# Patient Record
Sex: Female | Born: 1990
Health system: Southern US, Community
[De-identification: ages and names within clinical notes are randomized; demographics above are authoritative.]

## PROBLEM LIST (undated history)

## (undated) ENCOUNTER — Emergency Department (HOSPITAL_COMMUNITY): Payer: Medicaid Other

## (undated) ENCOUNTER — Inpatient Hospital Stay (HOSPITAL_COMMUNITY): Payer: Self-pay

## (undated) DIAGNOSIS — O98811 Other maternal infectious and parasitic diseases complicating pregnancy, first trimester: Secondary | ICD-10-CM

## (undated) DIAGNOSIS — O24419 Gestational diabetes mellitus in pregnancy, unspecified control: Secondary | ICD-10-CM

## (undated) DIAGNOSIS — A549 Gonococcal infection, unspecified: Secondary | ICD-10-CM

## (undated) DIAGNOSIS — I10 Essential (primary) hypertension: Secondary | ICD-10-CM

## (undated) DIAGNOSIS — A599 Trichomoniasis, unspecified: Secondary | ICD-10-CM

## (undated) DIAGNOSIS — O139 Gestational [pregnancy-induced] hypertension without significant proteinuria, unspecified trimester: Secondary | ICD-10-CM

## (undated) DIAGNOSIS — D649 Anemia, unspecified: Secondary | ICD-10-CM

## (undated) DIAGNOSIS — A6000 Herpesviral infection of urogenital system, unspecified: Secondary | ICD-10-CM

## (undated) DIAGNOSIS — A749 Chlamydial infection, unspecified: Secondary | ICD-10-CM

## (undated) HISTORY — DX: Gestational diabetes mellitus in pregnancy, unspecified control: O24.419

## (undated) HISTORY — PX: NO PAST SURGERIES: SHX2092

## (undated) HISTORY — DX: Herpesviral infection of urogenital system, unspecified: A60.00

## (undated) HISTORY — DX: Gestational (pregnancy-induced) hypertension without significant proteinuria, unspecified trimester: O13.9

## (undated) HISTORY — DX: Anemia, unspecified: D64.9

## (undated) HISTORY — DX: Chlamydial infection, unspecified: A74.9

## (undated) HISTORY — DX: Other maternal infectious and parasitic diseases complicating pregnancy, first trimester: O98.811

---
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
## 2010-12-07 NOTE — L&D Delivery Note (Signed)
Delivery Note At  a viable unspecified sex was delivered via  (Presentation:LOA ;  ).  APGAR: , ; weight .   Placenta status:spont vis shultz, foul odor noted with delivery , .  Cord:3 VC  with the following complications:Current GC and Chla infection .    Anesthesia:  none Episiotomy: none Lacerations: none Suture Repair: none Est. Blood Loss350 (mL):   Mom to postpartum.  Baby to nursery-stable.  [name] 11/01/2011, 4:43 PM

## 2011-05-26 ENCOUNTER — Emergency Department (HOSPITAL_COMMUNITY)
Admission: EM | Admit: 2011-05-26 | Discharge: 2011-05-26 | Disposition: A | Discharge status: Home / Self Care | Payer: Self-pay | Attending: Emergency Medicine | Admitting: Emergency Medicine

## 2011-05-26 ENCOUNTER — Emergency Department (HOSPITAL_COMMUNITY): Payer: Self-pay

## 2011-05-26 DIAGNOSIS — B3731 Acute candidiasis of vulva and vagina: Secondary | ICD-10-CM | POA: Insufficient documentation

## 2011-05-26 DIAGNOSIS — O98819 Other maternal infectious and parasitic diseases complicating pregnancy, unspecified trimester: Secondary | ICD-10-CM | POA: Insufficient documentation

## 2011-05-26 DIAGNOSIS — A6000 Herpesviral infection of urogenital system, unspecified: Secondary | ICD-10-CM | POA: Insufficient documentation

## 2011-05-26 DIAGNOSIS — A599 Trichomoniasis, unspecified: Secondary | ICD-10-CM | POA: Insufficient documentation

## 2011-05-26 DIAGNOSIS — O2390 Unspecified genitourinary tract infection in pregnancy, unspecified trimester: Secondary | ICD-10-CM | POA: Insufficient documentation

## 2011-05-26 DIAGNOSIS — O98519 Other viral diseases complicating pregnancy, unspecified trimester: Secondary | ICD-10-CM | POA: Insufficient documentation

## 2011-05-26 DIAGNOSIS — B373 Candidiasis of vulva and vagina: Secondary | ICD-10-CM | POA: Insufficient documentation

## 2011-05-26 DIAGNOSIS — A499 Bacterial infection, unspecified: Secondary | ICD-10-CM | POA: Insufficient documentation

## 2011-05-26 DIAGNOSIS — N390 Urinary tract infection, site not specified: Secondary | ICD-10-CM | POA: Insufficient documentation

## 2011-05-26 DIAGNOSIS — B9689 Other specified bacterial agents as the cause of diseases classified elsewhere: Secondary | ICD-10-CM | POA: Insufficient documentation

## 2011-05-26 DIAGNOSIS — N760 Acute vaginitis: Secondary | ICD-10-CM | POA: Insufficient documentation

## 2011-05-26 LAB — URINALYSIS, ROUTINE W REFLEX MICROSCOPIC
Glucose, UA: NEGATIVE mg/dL
Glucose, UA: NEGATIVE mg/dL
Nitrite: NEGATIVE
Protein, ur: 30 mg/dL — AB
Specific Gravity, Urine: 1.03 (ref 1.005–1.030)
Specific Gravity, Urine: 1.029 (ref 1.005–1.030)
Urobilinogen, UA: 1 mg/dL (ref 0.0–1.0)
pH: 6 (ref 5.0–8.0)

## 2011-05-26 LAB — POCT I-STAT, CHEM 8
Chloride: 103 mEq/L (ref 96–112)
Creatinine, Ser: 0.5 mg/dL (ref 0.50–1.10)
Glucose, Bld: 80 mg/dL (ref 70–99)
Hemoglobin: 11.9 g/dL — ABNORMAL LOW (ref 12.0–15.0)
Potassium: 3.2 mEq/L — ABNORMAL LOW (ref 3.5–5.1)
Sodium: 136 mEq/L (ref 135–145)
TCO2: 22 mmol/L (ref 0–100)

## 2011-05-26 LAB — URINE MICROSCOPIC-ADD ON

## 2011-05-26 LAB — POCT PREGNANCY, URINE: Preg Test, Ur: POSITIVE

## 2011-05-26 LAB — WET PREP, GENITAL

## 2011-05-27 LAB — URINE CULTURE
Colony Count: NO GROWTH
Culture  Setup Time: 201206191823

## 2011-05-27 LAB — GC/CHLAMYDIA PROBE AMP, GENITAL: GC Probe Amp, Genital: POSITIVE — AB

## 2011-05-28 LAB — HERPES SIMPLEX VIRUS CULTURE: Culture: DETECTED

## 2011-07-02 ENCOUNTER — Inpatient Hospital Stay (HOSPITAL_COMMUNITY)
Admission: AD | Admit: 2011-07-02 | Discharge: 2011-07-03 | Disposition: A | Discharge status: Home / Self Care | Payer: Self-pay | Source: Ambulatory Visit | Attending: Obstetrics & Gynecology | Admitting: Obstetrics & Gynecology

## 2011-07-02 ENCOUNTER — Inpatient Hospital Stay (HOSPITAL_COMMUNITY): Payer: Self-pay

## 2011-07-02 ENCOUNTER — Encounter (HOSPITAL_COMMUNITY): Payer: Self-pay | Admitting: *Deleted

## 2011-07-02 DIAGNOSIS — O9989 Other specified diseases and conditions complicating pregnancy, childbirth and the puerperium: Secondary | ICD-10-CM | POA: Insufficient documentation

## 2011-07-02 DIAGNOSIS — R109 Unspecified abdominal pain: Secondary | ICD-10-CM | POA: Insufficient documentation

## 2011-07-02 DIAGNOSIS — A599 Trichomoniasis, unspecified: Secondary | ICD-10-CM | POA: Insufficient documentation

## 2011-07-02 MED ORDER — METRONIDAZOLE 500 MG PO TABS
2000.0000 mg | ORAL_TABLET | Freq: Once | ORAL | Status: DC
  Filled 2011-07-02: qty 4

## 2011-07-02 MED ORDER — METRONIDAZOLE 50 MG/ML ORAL SUSPENSION: 2000.0000 mg | Freq: Once | ORAL | Status: DC

## 2011-07-02 NOTE — Progress Notes (Signed)
"  I have been having bad pains at the bottom of my stomach for 2 days.  No N/V/D."

## 2011-07-02 NOTE — ED Provider Notes (Signed)
Chief Complaint:  Abdominal Pain   Tanya Moreno is  20 y.o. G1P0.  No LMP recorded. Patient is pregnant..  Her pregnancy status is positive.  She presents complaining of Abdominal Pain . Onset is described as gradual and has been present for  2 hours.   Obstetrical/Gynecological History: Pertinent Gynecological History: Menses:  Bleeding:  Contraception:  DES exposure: denies Blood transfusions: +GC/CHL and HSV2 culture 6/19.  Pt and partner were treated Sexually transmitted diseases:  Previous GYN Procedures:   Last mammogram:  Date:     Past Medical History: No past medical history on file.  Past Surgical History: No past surgical history on file.  Family History: No family history on file.  Social History: History  Substance Use Topics  . Smoking status: Not on file  . Smokeless tobacco: Not on file  . Alcohol Use: Not on file    Allergies:  Allergies  Allergen Reactions  . Latex Swelling    No prescriptions prior to admission    Review of Systems - General ROS: positive for  - lower abdominal cramping  Physical Exam   Blood pressure 108/61, pulse 70, temperature 98.7 F (37.1 C), temperature source Oral, resp. rate 18, height 5\' 4"  (1.626 m), weight 76.204 kg (168 lb).  General: General appearance - alert, well appearing, and in no distress Heart - normal rate, regular rhythm, normal S1, S2, no murmurs, rubs, clicks or gallops, normal rate and regular rhythm Abdomen - soft, nontender, nondistended, no masses or organomegaly no rebound tenderness noted bowel sounds normal Focused Gynecological Exam: normal external genitalia, vulva, vagina; CX closed, feels short, soft  Labs: No results found for this or any previous visit (from the past 24 hour(s)). Imaging Studies:  No results found.   Assessment: There is no problem list on file for this patient.   Plan: Transvaginal u/s to assess cx length  CRESENZO-DISHMAN,[name] 07/02/2011,10:21  PM

## 2011-07-02 NOTE — Progress Notes (Signed)
Pt reports "I have been having real bad pain for 2 days" , has not started prenatal care yet , just found out she was pregnant on 06/19. Denies dysuria, nausea, vomiting, diarrhea, fever. Denies bleeding

## 2011-08-12 ENCOUNTER — Inpatient Hospital Stay (HOSPITAL_COMMUNITY)
Admission: AD | Admit: 2011-08-12 | Discharge: 2011-08-12 | Disposition: A | Discharge status: Home / Self Care | Payer: Self-pay | Source: Ambulatory Visit | Attending: Obstetrics & Gynecology | Admitting: Obstetrics & Gynecology

## 2011-08-12 ENCOUNTER — Encounter (HOSPITAL_COMMUNITY): Payer: Self-pay | Admitting: *Deleted

## 2011-08-12 DIAGNOSIS — R109 Unspecified abdominal pain: Secondary | ICD-10-CM | POA: Insufficient documentation

## 2011-08-12 DIAGNOSIS — D649 Anemia, unspecified: Secondary | ICD-10-CM

## 2011-08-12 DIAGNOSIS — R42 Dizziness and giddiness: Secondary | ICD-10-CM | POA: Insufficient documentation

## 2011-08-12 DIAGNOSIS — O9989 Other specified diseases and conditions complicating pregnancy, childbirth and the puerperium: Secondary | ICD-10-CM | POA: Insufficient documentation

## 2011-08-12 LAB — CBC
HCT: 29.4 % — ABNORMAL LOW (ref 36.0–46.0)
Hemoglobin: 9.7 g/dL — ABNORMAL LOW (ref 12.0–15.0)
MCH: 27 pg (ref 26.0–34.0)
MCHC: 33 g/dL (ref 30.0–36.0)
MCV: 81.9 fL (ref 78.0–100.0)
RBC: 3.59 MIL/uL — ABNORMAL LOW (ref 3.87–5.11)

## 2011-08-12 MED ORDER — FERROUS SULFATE 325 (65 FE) MG PO TABS: 325.0000 mg | ORAL_TABLET | Freq: Three times a day (TID) | ORAL | Status: DC

## 2011-08-12 NOTE — Progress Notes (Signed)
Pt reports being at home, standing up from toilet and feeling dizzy.  Called ems.

## 2011-08-12 NOTE — ED Provider Notes (Signed)
Chief Complaint:  Abdominal pain and dizziness   Tanya Moreno is  20 y.o. G1P0.  No LMP recorded. Patient is pregnant..  Her pregnancy status is positive.  She presents complaining of Abdominal pain and dizziness . Onset is described as sudden around 2 PM. While sitting on toilet. Lower abdominal non-radiating. Last 10 minutes. Dizziness last a few seconds. She denies vaginal bleeding, LOF, vaginal discharge. She also denies CP, SOB.   Obstetrical/Gynecological History: Pertinent Gynecological History: Menses:  Bleeding:  Contraception:  DES exposure: denies Blood transfusions: +GC/CHL and HSV2 culture 6/19.  Pt and partner were treated Sexually transmitted diseases:  Previous GYN Procedures:   Last mammogram:  Date:     Past Medical History: Past Medical History  Diagnosis Date  . No pertinent past medical history     Past Surgical History: Past Surgical History  Procedure Date  . No past surgeries     Family History: History reviewed. No pertinent family history.  Social History: History  Substance Use Topics  . Smoking status: Not on file  . Smokeless tobacco: Not on file  . Alcohol Use: No    Allergies:  Allergies  Allergen Reactions  . Latex Swelling    No prescriptions prior to admission    Review of Systems - General ROS: positive for  - lower abdominal cramping  Physical Exam   Blood pressure 95/54, pulse 86, temperature 98.2 F (36.8 C), temperature source Oral, resp. rate 18.  General: General appearance - alert, well appearing, and in no distress Heart - normal rate, regular rhythm, normal S1, S2, no murmurs, rubs, clicks or gallops, normal rate and regular rhythm Abdomen - soft, nontender, nondistended, no masses or organomegaly no rebound tenderness noted bowel sounds normal  FHT: baseline 150, moderate variability, no accels, no decels. Toco: no contractions.   Labs: No results found for this or any previous visit (from the past 24  hour(s)). Imaging Studies:  No results found.   Assessment: There is no problem list on file for this patient.   Plan: CBC to assess for possible anemia.  If normal patient stable for d/c to home. She has just received her medicaid and will have prenatal care at [facility] starting on 08/30/11 [facility] 08/12/2011,5:57 PM

## 2011-08-26 ENCOUNTER — Inpatient Hospital Stay (HOSPITAL_COMMUNITY)
Admission: AD | Admit: 2011-08-26 | Discharge: 2011-08-26 | Disposition: A | Discharge status: Home / Self Care | Payer: Self-pay | Source: Ambulatory Visit | Attending: Obstetrics & Gynecology | Admitting: Obstetrics & Gynecology

## 2011-08-26 ENCOUNTER — Encounter (HOSPITAL_COMMUNITY): Payer: Self-pay | Admitting: *Deleted

## 2011-08-26 DIAGNOSIS — O0930 Supervision of pregnancy with insufficient antenatal care, unspecified trimester: Secondary | ICD-10-CM

## 2011-08-26 DIAGNOSIS — Z91040 Latex allergy status: Secondary | ICD-10-CM

## 2011-08-26 DIAGNOSIS — O368130 Decreased fetal movements, third trimester, not applicable or unspecified: Secondary | ICD-10-CM

## 2011-08-26 DIAGNOSIS — O98819 Other maternal infectious and parasitic diseases complicating pregnancy, unspecified trimester: Secondary | ICD-10-CM | POA: Insufficient documentation

## 2011-08-26 DIAGNOSIS — J069 Acute upper respiratory infection, unspecified: Secondary | ICD-10-CM | POA: Insufficient documentation

## 2011-08-26 DIAGNOSIS — A599 Trichomoniasis, unspecified: Secondary | ICD-10-CM

## 2011-08-26 DIAGNOSIS — Z3400 Encounter for supervision of normal first pregnancy, unspecified trimester: Secondary | ICD-10-CM

## 2011-08-26 DIAGNOSIS — D649 Anemia, unspecified: Secondary | ICD-10-CM

## 2011-08-26 DIAGNOSIS — O98519 Other viral diseases complicating pregnancy, unspecified trimester: Secondary | ICD-10-CM

## 2011-08-26 DIAGNOSIS — O368190 Decreased fetal movements, unspecified trimester, not applicable or unspecified: Secondary | ICD-10-CM | POA: Insufficient documentation

## 2011-08-26 DIAGNOSIS — B009 Herpesviral infection, unspecified: Secondary | ICD-10-CM

## 2011-08-26 DIAGNOSIS — Z202 Contact with and (suspected) exposure to infections with a predominantly sexual mode of transmission: Secondary | ICD-10-CM

## 2011-08-26 DIAGNOSIS — A5901 Trichomonal vulvovaginitis: Secondary | ICD-10-CM | POA: Insufficient documentation

## 2011-08-26 DIAGNOSIS — O99891 Other specified diseases and conditions complicating pregnancy: Secondary | ICD-10-CM | POA: Insufficient documentation

## 2011-08-26 LAB — URINALYSIS, ROUTINE W REFLEX MICROSCOPIC
Bilirubin Urine: NEGATIVE
Ketones, ur: NEGATIVE mg/dL
Nitrite: NEGATIVE
Urobilinogen, UA: 0.2 mg/dL (ref 0.0–1.0)

## 2011-08-26 LAB — CBC
MCH: 26.1 pg (ref 26.0–34.0)
MCV: 81.1 fL (ref 78.0–100.0)
Platelets: 290 10*3/uL (ref 150–400)
RDW: 14.2 % (ref 11.5–15.5)
WBC: 8.9 10*3/uL (ref 4.0–10.5)

## 2011-08-26 LAB — URINE MICROSCOPIC-ADD ON

## 2011-08-26 MED ORDER — METRONIDAZOLE 500 MG PO TABS
2000.0000 mg | ORAL_TABLET | Freq: Once | ORAL | Status: AC
  Administered 2011-08-26: 2000 mg via ORAL
  Filled 2011-08-26: qty 4

## 2011-08-26 MED ORDER — ONDANSETRON 8 MG PO TBDP
8.0000 mg | ORAL_TABLET | Freq: Once | ORAL | Status: AC
  Administered 2011-08-26: 8 mg via ORAL
  Filled 2011-08-26: qty 1

## 2011-08-26 NOTE — ED Provider Notes (Signed)
ENYAH MOMAN is a 20 y.o. female G1P0 female at [redacted]w[redacted]d weeks gestation who presents to MAU reporting decreased FM since this morning and cough, congestion, body aches x 2-3 days. She denies dysuria, UC's, VB, vaginal discharge, flank pain or chills. She reports fever of 101 2-3 days ago that resolved spontaneously. She has not taken any medication during this illness. She has not started [facility], but states she is scheduled for a NOB at [facility] on 08/31/11. She has been seen in the ED and MAU twice this pregnancy and was Dx w/ GC, CT and Trich adn Tx. She states her partner was Tx too.  Maternal Medical History:  Reason for admission: Reason for Admission:   nauseaFetal activity: Perceived fetal activity is decreased.   Last perceived fetal movement was within the past 12 hours.      OB History    Grav Para Term Preterm Abortions TAB SAB Ect Mult Living   1              Patient Active Problem List  Diagnoses  . Prenatal care insufficient  . Chlamydia contact, treated  . Gonorrhea contact, treated  . Trichomonas contact, treated  . First normal pregnancy supervision  . Latex allergy  . Herpes simplex type 2 infection complicating pregnancy    Past Medical History  Diagnosis Date      Anemia  Past Surgical History  Procedure Date  . No past surgeries    Family History: family history is not on file. Social History:  reports that she has never smoked. She does not have any smokeless tobacco history on file. She reports that she does not drink alcohol or use illicit drugs.  Review of Systems  Constitutional: Positive for fever (resolved).  HENT: Positive for congestion. Negative for ear pain and sore throat.   Respiratory: Positive for cough and sputum production. Negative for shortness of breath and wheezing.   Gastrointestinal: Negative.  Negative for nausea, vomiting and diarrhea.  Genitourinary: Negative for dysuria, urgency, frequency, hematuria and flank pain.    Musculoskeletal: Positive for myalgias.   Otherwise neg  Dilation: Closed Effacement (%): Thick Station: -2 Exam by:: Ivonne Andrew CNM Blood pressure 108/62, pulse 94, temperature 98.2 F (36.8 C), temperature source Oral, resp. rate 16, height 5\' 3"  (1.6 m), weight 79.55 kg (175 lb 6 oz). Maternal Exam:  Uterine Assessment: None  Abdomen: Fundal height is S=D.    Introitus: Normal vulva. Vagina is positive for vaginal discharge (moderate amount of yellow, frothy, malodorous discharge.).  Pelvis: adequate for delivery.   Cervix: Cervix evaluated by sterile speculum exam and digital exam.     Fetal Exam Fetal Monitor Review: Mode: ultrasound.   Baseline rate: 130's.  Variability: moderate (6-25 bpm).   Pattern: accelerations present and no decelerations.    Fetal State Assessment: Category I - tracings are normal.     Physical Exam  Constitutional: She is oriented to person, place, and time. She appears well-developed and well-nourished. No distress.  Cardiovascular: Normal rate and regular rhythm.   Murmur (I/VI systolic) heard. Respiratory: Effort normal and breath sounds normal. No respiratory distress. She has no wheezes. She has no rales. She exhibits no tenderness.  GI: Soft. There is no tenderness.  Genitourinary: Uterus is not tender. Cervix exhibits friability. Cervix exhibits no motion tenderness and no discharge. No erythema, tenderness or bleeding around the vagina. Vaginal discharge (moderate amount of yellow, frothy, malodorous discharge.) found.  Lymphadenopathy:    She has no  cervical adenopathy.  Neurological: She is alert and oriented to person, place, and time.  Skin: Skin is warm and dry. She is not diaphoretic.  Psychiatric: She has a normal mood and affect.    Prenatal labs: ABO, Rh:   Antibody:   Rubella:   RPR:    HBsAg:    HIV:    GBS:     Assessment/Plan: Assessment: 1. Viral URI 2. Trichomonas 3. 30.5 week IUP 4. No PNC  Plan: 1.  Flagyl 2 gm PO x 1 now. Needs to have partner Tx. 2. GC/CT 3. Increase rest. Fluids. May take Mucinex. 4. F/U AS at [facility] 08/31/11 or PRN for worsening of Sx or fever. 5. Reviewed PTL precautions adn safe sex practices. Discussed effects of STDs in pregnancy. 6. Urine Culture    [place], Koleen Nimrod 08/26/2011, 12:17 PM

## 2011-08-26 NOTE — Progress Notes (Signed)
States has been around multiple people who have been sick with runny nose, sore throat, fever. Pt has cough/congestion, afebrile in triage, body aches, denies sore throat. Last good fm felt last pm.

## 2011-08-27 LAB — URINE CULTURE
Culture  Setup Time: 201209200012
Special Requests: NORMAL

## 2011-11-01 ENCOUNTER — Encounter (HOSPITAL_COMMUNITY): Payer: Self-pay | Admitting: *Deleted

## 2011-11-01 ENCOUNTER — Inpatient Hospital Stay (HOSPITAL_COMMUNITY)
Admission: AD | Admit: 2011-11-01 | Discharge: 2011-11-03 | DRG: 775 | Disposition: A | Discharge status: Home / Self Care | Payer: Medicaid Other | Source: Ambulatory Visit | Attending: Obstetrics & Gynecology | Admitting: Obstetrics & Gynecology

## 2011-11-01 ENCOUNTER — Inpatient Hospital Stay (HOSPITAL_COMMUNITY): Payer: Medicaid Other

## 2011-11-01 ENCOUNTER — Other Ambulatory Visit (HOSPITAL_COMMUNITY): Payer: Self-pay | Admitting: Obstetrics and Gynecology

## 2011-11-01 DIAGNOSIS — O0930 Supervision of pregnancy with insufficient antenatal care, unspecified trimester: Secondary | ICD-10-CM

## 2011-11-01 DIAGNOSIS — IMO0001 Reserved for inherently not codable concepts without codable children: Secondary | ICD-10-CM

## 2011-11-01 HISTORY — DX: Chlamydial infection, unspecified: A74.9

## 2011-11-01 HISTORY — DX: Gonococcal infection, unspecified: A54.9

## 2011-11-01 LAB — CBC
HCT: 32.7 % — ABNORMAL LOW (ref 36.0–46.0)
MCV: 77.7 fL — ABNORMAL LOW (ref 78.0–100.0)
RDW: 15.5 % (ref 11.5–15.5)
WBC: 9.8 10*3/uL (ref 4.0–10.5)

## 2011-11-01 LAB — ABO/RH

## 2011-11-01 MED ORDER — ONDANSETRON HCL 4 MG PO TABS: 4.0000 mg | ORAL_TABLET | ORAL | Status: DC | PRN

## 2011-11-01 MED ORDER — IBUPROFEN 600 MG PO TABS
600.0000 mg | ORAL_TABLET | Freq: Four times a day (QID) | ORAL | Status: DC
  Filled 2011-11-01: qty 1

## 2011-11-01 MED ORDER — OXYCODONE-ACETAMINOPHEN 5-325 MG PO TABS: 2.0000 | ORAL_TABLET | ORAL | Status: DC | PRN

## 2011-11-01 MED ORDER — IBUPROFEN 600 MG PO TABS: 600.0000 mg | ORAL_TABLET | Freq: Four times a day (QID) | ORAL | Status: DC | PRN

## 2011-11-01 MED ORDER — WITCH HAZEL-GLYCERIN EX PADS: 1.0000 "application " | MEDICATED_PAD | CUTANEOUS | Status: DC | PRN

## 2011-11-01 MED ORDER — NALBUPHINE SYRINGE 5 MG/0.5 ML: 10.0000 mg | INJECTION | INTRAMUSCULAR | Status: DC | PRN

## 2011-11-01 MED ORDER — OXYTOCIN BOLUS FROM INFUSION
500.0000 mL | Freq: Once | INTRAVENOUS | Status: DC
  Filled 2011-11-01: qty 500
  Filled 2011-11-01: qty 1000

## 2011-11-01 MED ORDER — CITRIC ACID-SODIUM CITRATE 334-500 MG/5ML PO SOLN: 30.0000 mL | ORAL | Status: DC | PRN

## 2011-11-01 MED ORDER — ONDANSETRON HCL 4 MG/2ML IJ SOLN: 4.0000 mg | INTRAMUSCULAR | Status: DC | PRN

## 2011-11-01 MED ORDER — AZITHROMYCIN 1 G PO PACK
1.0000 g | PACK | Freq: Once | ORAL | Status: AC
  Administered 2011-11-01: 1 g via ORAL
  Filled 2011-11-01: qty 1

## 2011-11-01 MED ORDER — ZOLPIDEM TARTRATE 5 MG PO TABS: 5.0000 mg | ORAL_TABLET | Freq: Every evening | ORAL | Status: DC | PRN

## 2011-11-01 MED ORDER — BENZOCAINE-MENTHOL 20-0.5 % EX AERO: 1.0000 "application " | INHALATION_SPRAY | CUTANEOUS | Status: DC | PRN

## 2011-11-01 MED ORDER — LACTATED RINGERS IV SOLN: 500.0000 mL | INTRAVENOUS | Status: DC | PRN

## 2011-11-01 MED ORDER — CEFTRIAXONE SODIUM 250 MG IJ SOLR
250.0000 mg | Freq: Once | INTRAMUSCULAR | Status: AC
  Administered 2011-11-01: 250 mg via INTRAMUSCULAR
  Filled 2011-11-01: qty 250

## 2011-11-01 MED ORDER — OXYTOCIN 20 UNITS IN LACTATED RINGERS INFUSION - SIMPLE: 125.0000 mL/h | Freq: Once | INTRAVENOUS | Status: DC

## 2011-11-01 MED ORDER — PRENATAL PLUS 27-1 MG PO TABS
1.0000 | ORAL_TABLET | Freq: Every day | ORAL | Status: DC
  Filled 2011-11-01: qty 1

## 2011-11-01 MED ORDER — IBUPROFEN 100 MG/5ML PO SUSP
600.0000 mg | Freq: Four times a day (QID) | ORAL | Status: DC
  Filled 2011-11-01 (×7): qty 30

## 2011-11-01 MED ORDER — LACTATED RINGERS IV SOLN
INTRAVENOUS | Status: DC
  Administered 2011-11-01: 13:00:00 via INTRAVENOUS

## 2011-11-01 MED ORDER — LIDOCAINE HCL (PF) 1 % IJ SOLN
30.0000 mL | INTRAMUSCULAR | Status: DC | PRN
  Filled 2011-11-01: qty 30

## 2011-11-01 MED ORDER — DIBUCAINE 1 % RE OINT: 1.0000 "application " | TOPICAL_OINTMENT | RECTAL | Status: DC | PRN

## 2011-11-01 MED ORDER — ONDANSETRON HCL 4 MG/2ML IJ SOLN: 4.0000 mg | Freq: Four times a day (QID) | INTRAMUSCULAR | Status: DC | PRN

## 2011-11-01 MED ORDER — FLEET ENEMA 7-19 GM/118ML RE ENEM: 1.0000 | ENEMA | RECTAL | Status: DC | PRN

## 2011-11-01 MED ORDER — SENNOSIDES-DOCUSATE SODIUM 8.6-50 MG PO TABS: 2.0000 | ORAL_TABLET | Freq: Every day | ORAL | Status: DC

## 2011-11-01 MED ORDER — ACETAMINOPHEN 325 MG PO TABS: 650.0000 mg | ORAL_TABLET | ORAL | Status: DC | PRN

## 2011-11-01 MED ORDER — OXYCODONE-ACETAMINOPHEN 5-325 MG PO TABS: 1.0000 | ORAL_TABLET | ORAL | Status: DC | PRN

## 2011-11-01 MED ORDER — LANOLIN HYDROUS EX OINT: TOPICAL_OINTMENT | CUTANEOUS | Status: DC | PRN

## 2011-11-01 MED ORDER — SIMETHICONE 80 MG PO CHEW: 80.0000 mg | CHEWABLE_TABLET | ORAL | Status: DC | PRN

## 2011-11-01 MED ORDER — TETANUS-DIPHTH-ACELL PERTUSSIS 5-2.5-18.5 LF-MCG/0.5 IM SUSP
0.5000 mL | Freq: Once | INTRAMUSCULAR | Status: AC
  Administered 2011-11-02: 0.5 mL via INTRAMUSCULAR
  Filled 2011-11-01: qty 0.5

## 2011-11-01 MED ORDER — DIPHENHYDRAMINE HCL 25 MG PO CAPS: 25.0000 mg | ORAL_CAPSULE | Freq: Four times a day (QID) | ORAL | Status: DC | PRN

## 2011-11-01 NOTE — Progress Notes (Signed)
Report to H. Clovis Riley, Charity fundraiser.  To room 168 via w/c.

## 2011-11-01 NOTE — H&P (Signed)
Tanya Moreno is a 20 y.o. female presenting for labor at 65 2. Maternal Medical History:  Reason for admission: Reason for admission: contractions.  Contractions: Onset was 6-12 hours ago.   Frequency: regular.    Fetal activity: Perceived fetal activity is normal.   Last perceived fetal movement was within the past hour.      OB History    Grav Para Term Preterm Abortions TAB SAB Ect Mult Living   1              Past Medical History  Diagnosis Date  . No pertinent past medical history   . Gonorrhea   . Chlamydia    Past Surgical History  Procedure Date  . No past surgeries    Family History: family history is not on file. Social History:  reports that she has never smoked. She has never used smokeless tobacco. She reports that she does not drink alcohol or use illicit drugs.  Review of Systems  Constitutional: Negative.   HENT: Negative.   Eyes: Negative.   Respiratory: Negative.   Cardiovascular: Negative.   Gastrointestinal: Negative.   Genitourinary: Negative.   Musculoskeletal: Negative.   Skin: Negative.   Neurological: Negative.   Endo/Heme/Allergies: Negative.   Psychiatric/Behavioral: Negative.     Dilation: 3 Effacement (%): 100 Station: -3 Exam by:: L Davis RN There were no vitals taken for this visit. Maternal Exam:  Uterine Assessment: Contraction strength is moderate.  Contraction frequency is regular.   Abdomen: Patient reports no abdominal tenderness. Fetal presentation: vertex  Introitus: Normal vulva.   Physical Exam  Prenatal labs: ABO, Rh:   Antibody:   Rubella:   RPR:    HBsAg:    HIV:    GBS:     Assessment/Plan: NO PNC, PN panel, admit to labor and delivery. SVE 4-5/100/-1 mec stain fluid.   [name] 11/01/2011, 1:08 PM

## 2011-11-02 LAB — RAPID URINE DRUG SCREEN, HOSP PERFORMED
Amphetamines: NOT DETECTED
Tetrahydrocannabinol: NOT DETECTED

## 2011-11-02 MED ORDER — COMPLETENATE 29-1 MG PO CHEW
1.0000 | CHEWABLE_TABLET | Freq: Every day | ORAL | Status: DC
  Administered 2011-11-02: 1 via ORAL
  Filled 2011-11-02 (×2): qty 1

## 2011-11-02 MED ORDER — INFLUENZA VIRUS VACC SPLIT PF IM SUSP
0.5000 mL | INTRAMUSCULAR | Status: DC
  Filled 2011-11-02: qty 0.5

## 2011-11-02 NOTE — Progress Notes (Signed)
UR chart review completed.  

## 2011-11-02 NOTE — Progress Notes (Signed)
Post Partum Day 1 Subjective: no complaints, up ad lib, voiding and tolerating PO  Objective: Blood pressure 111/70, pulse 79, temperature 98.5 F (36.9 C), temperature source Oral, resp. rate 20, height 5\' 5"  (1.651 m), weight 77.111 kg (170 lb), SpO2 99.00%, unknown if currently breastfeeding.  Physical Exam:  General: alert, cooperative, appears stated age and no distress Lochia: appropriate Uterine Fundus: firm Incision: n/a DVT Evaluation: No evidence of DVT seen on physical exam. Negative Homan's sign. No cords or calf tenderness. No significant calf/ankle edema.   Basename 11/01/11 1327  HGB 10.4*  HCT 32.7*    Assessment/Plan: Plan for discharge tomorrow   LOS: 1 day   [name] 11/02/2011, 6:48 AM

## 2011-11-02 NOTE — Progress Notes (Signed)
PSYCHOSOCIAL ASSESSMENT ~ MATERNAL/CHILD Name:   Tanya Moreno.                                                                                   Age: 20  Referral Date:       11 /26/12   Reason/Source: NPNC / CN  I. FAMILY/HOME ENVIRONMENT A. Child's Legal Guardian _X__Parent(s) ___Grandparent ___Foster parent ___DSS_________________ Name: Tanya Moreno                                DOB: //                     Age: 46  Address: [address] [place], [place] [address]  Name:   Tanya Moreno                               DOB: //                     Age: 20  Address:  B. Other Household Members/Support Persons Name:                                         Relationship:  grandmother  DOB ___/___/___                   Name:                                         Relationship:  sister             DOB ___/___/___                   Name:                                         Relationship:  nephew         DOB ___/___/___                   Name:                                         Relationship:                        DOB ___/___/___  C. Other Support:   II. PSYCHOSOCIAL DATA A. Information Source  _X_Patient Interview  __Family Interview           __Other___________  B. Event organiser __Employment: __Medicaid    [place]:                 __Private Insurance:                   _X_Self Pay  __Food Stamps   __WIC __[employer]     __Public Housing     __Section 8    __Maternity Care Coordination/Child Service Coordination/Early Intervention   ___School:                                                                         Grade:  __Other:   Tanya Moreno Cultural and Environment Information Cultural Issues Impacting Care:  III. STRENGTHS _X__Supportive family/friends _X__Adequate Resources ___Compliance with medical plan _X__Home prepared for Child (including basic  supplies) ___Understanding of illness      ___Other: RISK FACTORS AND CURRENT PROBLEMS         ____No Problems Noted   NPNC                                                                                                                                                                                                                                                    IV. SOCIAL WORK ASSESSMENT  Sw met with pt to assess reason for [facility].  Pt learned of pregnancy at 4 months.  According to pt, she applied for Medicaid but has not received approval letter.  She denies any illegal substance use.  UDS is negative and meconium results are pending.  She identified her family as primary support system.  She has supplies for the infant but express need for additional clothing, as they were expecting a girl.  Sw provided the pt with a bundle pack of clothes.  Pt is aware of $30 car seat fee and may be interested in purchasing.  Pt's sister asked this Sw to refer pt to the [facility] program, as she thought the pt would benefit.  Pt agrees with the referral.  FOB will be involved, as per the pt.  Sw will follow up with drug screen results and make a referral if needed.    V. SOCIAL WORK PLAN  _X__No Further Intervention Required/No Barriers to Discharge   ___Psychosocial Support and Ongoing Assessment of Needs   ___Patient/Family Education:   ___Child Protective Services Report   County___________ Date___/____/____   _X_Information/Referral to [employer]: [employer]  ___Other:

## 2011-11-03 MED ORDER — IBUPROFEN 100 MG/5ML PO SUSP: 600.0000 mg | Freq: Four times a day (QID) | ORAL | Status: DC

## 2011-11-03 MED ORDER — SENNOSIDES-DOCUSATE SODIUM 8.6-50 MG PO TABS: 2.0000 | ORAL_TABLET | Freq: Every day | ORAL | Status: DC

## 2011-11-03 MED ORDER — MEDROXYPROGESTERONE ACETATE 150 MG/ML IM SUSP
150.0000 mg | Freq: Once | INTRAMUSCULAR | Status: AC
  Administered 2011-11-03: 150 mg via INTRAMUSCULAR
  Filled 2011-11-03: qty 1

## 2011-11-03 NOTE — Discharge Summary (Signed)
Obstetric Discharge Summary Reason for Admission: onset of labor Prenatal Procedures: NST Intrapartum Procedures: spontaneous vaginal delivery Postpartum Procedures: none Complications-Operative and Postpartum: none Hemoglobin  Date Value Range Status  11/01/2011 10.4* 12.0-15.0 (g/dL) Final     HCT  Date Value Range Status  11/01/2011 32.7* 36.0-46.0 (%) Final    Discharge Diagnoses: Term Pregnancy-delivered  [name] 20 y.o. female  Now G1P1001 who presented  at [redacted]w[redacted]d and delivered a healthy female via Vaginal, Spontaneous Delivery (Presentation: ; Occiput Anterior).  APGAR: 9, 9; weight 6 lb 13.9 oz (3116 g).   Placenta status: Intact, Spontaneous.  Cord: 3 vessels with the following complications: None.  350 cc blood loss. No lacerations.   Patient with poor prenatal care but given instructions to follow up at [facility] hospital outpatient clinic. Patient had depo-provera shot ordered before discharge. Plans to bottle feed.  Discharge Information: Date: 11/03/2011 Activity: pelvic rest Diet: routine Medications: Ibuprofen and Colace Condition: stable Instructions: refer to practice specific booklet Discharge to: home Follow-up Information    Follow up with [facility]. Make an appointment in 6 weeks.   Contact information:   [address] [place] [address]          Newborn Data: Live born female  Birth Weight: 6 lb 13.9 oz (3116 g) APGAR: 9, 9  Home with mother.  Tana Conch, MD 11/03/2011, 7:41 AM

## 2011-11-03 NOTE — Progress Notes (Signed)
Post Partum Day 2 SVD Subjective: up ad lib, voiding, tolerating PO and + flatus  Objective: Blood pressure 114/71, pulse 90, temperature 98.3 F (36.8 C), temperature source Oral, resp. rate 18, height 5\' 5"  (1.651 m), weight 77.111 kg (170 lb), SpO2 99.00%, unknown if currently breastfeeding.  Physical Exam:  General: alert, cooperative and no distress Lochia: appropriate Uterine Fundus: firm DVT Evaluation: No cords or calf tenderness. No significant calf/ankle edema.   Basename 11/01/11 1327  HGB 10.4*  HCT 32.7*    Assessment/Plan: Discharge home and Contraception depo provera before discharge. Bottle feeding.   Will follow up with  [facility]   LOS: 2 days   Anjolie Majer 11/03/2011, 6:00 AM

## 2011-11-04 NOTE — Discharge Summary (Signed)
Attestation of Attending Supervision of Resident: Evaluation and management procedures were performed by the [facility] Resident under my supervision.  I have reviewed the resident's note, chart reviewed and agree with management and plan.  Jaynie Collins, M.D. 11/04/2011 1:45 PM

## 2011-12-03 ENCOUNTER — Ambulatory Visit: Payer: Self-pay | Admitting: Obstetrics and Gynecology

## 2012-10-09 ENCOUNTER — Encounter (HOSPITAL_COMMUNITY): Payer: Self-pay | Admitting: *Deleted

## 2012-10-09 ENCOUNTER — Inpatient Hospital Stay (HOSPITAL_COMMUNITY)
Admission: AD | Admit: 2012-10-09 | Discharge: 2012-10-09 | Disposition: A | Discharge status: Home / Self Care | Payer: Self-pay | Source: Ambulatory Visit | Attending: Obstetrics | Admitting: Obstetrics

## 2012-10-09 ENCOUNTER — Inpatient Hospital Stay (HOSPITAL_COMMUNITY): Payer: Self-pay

## 2012-10-09 DIAGNOSIS — N390 Urinary tract infection, site not specified: Secondary | ICD-10-CM | POA: Insufficient documentation

## 2012-10-09 DIAGNOSIS — Z3490 Encounter for supervision of normal pregnancy, unspecified, unspecified trimester: Secondary | ICD-10-CM

## 2012-10-09 DIAGNOSIS — Z1389 Encounter for screening for other disorder: Secondary | ICD-10-CM

## 2012-10-09 DIAGNOSIS — O2340 Unspecified infection of urinary tract in pregnancy, unspecified trimester: Secondary | ICD-10-CM

## 2012-10-09 DIAGNOSIS — R109 Unspecified abdominal pain: Secondary | ICD-10-CM | POA: Insufficient documentation

## 2012-10-09 DIAGNOSIS — O2390 Unspecified genitourinary tract infection in pregnancy, unspecified trimester: Secondary | ICD-10-CM | POA: Insufficient documentation

## 2012-10-09 LAB — WET PREP, GENITAL
Trich, Wet Prep: NONE SEEN
Yeast Wet Prep HPF POC: NONE SEEN

## 2012-10-09 LAB — URINALYSIS, ROUTINE W REFLEX MICROSCOPIC
Bilirubin Urine: NEGATIVE
Glucose, UA: NEGATIVE mg/dL
Ketones, ur: NEGATIVE mg/dL
pH: 6 (ref 5.0–8.0)

## 2012-10-09 LAB — URINE MICROSCOPIC-ADD ON

## 2012-10-09 MED ORDER — PRENATAL VITAMINS 28-0.8 MG PO TABS: 1.0000 | ORAL_TABLET | Freq: Every day | ORAL | Status: DC

## 2012-10-09 MED ORDER — NITROFURANTOIN MONOHYD MACRO 100 MG PO CAPS: 100.0000 mg | ORAL_CAPSULE | Freq: Two times a day (BID) | ORAL | Status: DC

## 2012-10-09 NOTE — MAU Note (Signed)
"  Since last night I have been having lower pains in my stomach. They have been coming and going" Vomiting for the last month. Positive HPT in September

## 2012-10-09 NOTE — MAU Provider Note (Signed)
Chief Complaint: Abdominal Pain   First Provider Initiated Contact with Patient 10/09/12 1926     SUBJECTIVE HPI: Tanya Moreno is a 21 y.o. G1P1001 at [redacted]w[redacted]d by LMP who presents to maternity admissions reporting abdominal pain described as cramping "low and in the front", and vaginal discharge x1 week. Pt had +HPT last week.  Patient's last menstrual period was 07/26/2012.  She denies LOF, vaginal bleeding, vaginal itching/burning, urinary symptoms, h/a, dizziness, n/v, or fever/chills.     Past Medical History  Diagnosis Date  . No pertinent past medical history   . Gonorrhea   . Chlamydia    Past Surgical History  Procedure Date  . No past surgeries    History   Social History  . Marital Status: Single    Spouse Name: N/A    Number of Children: N/A  . Years of Education: N/A   Occupational History  . Not on file.   Social History Main Topics  . Smoking status: Never Smoker   . Smokeless tobacco: Never Used  . Alcohol Use: No  . Drug Use: No  . Sexually Active: Yes   Other Topics Concern  . Not on file   Social History Narrative   Lives with godmother.    No current facility-administered medications on file prior to encounter.   No current outpatient prescriptions on file prior to encounter.   Allergies  Allergen Reactions  . Latex Swelling    ROS: Pertinent items in HPI  OBJECTIVE Blood pressure 137/71, pulse 98, temperature 97.5 F (36.4 C), temperature source Oral, resp. rate 18, height 5\' 4"  (1.626 m), weight 91.173 kg (201 lb), last menstrual period 07/26/2012. GENERAL: Well-developed, well-nourished female in no acute distress.  HEENT: Normocephalic HEART: normal rate RESP: normal effort ABDOMEN: Soft, non-tender EXTREMITIES: Nontender, no edema NEURO: Alert and oriented Pelvic exam: Cervix pink, visually closed, without lesion, moderate thick yellow discharge, vaginal walls normal, 1cm raised flesh colored painful lesion with small area leaking  watery fluid on right mons area.  Pt reports seeing this lesion yesterday after shaving.  HSV DNA by PCR collected. Bimanual exam: Cervix 0/long/high, firm, anterior, neg CMT, uterus nontender, ~ 12 week size, adnexa without tenderness, enlargement, or mass  LAB RESULTS Results for orders placed during the hospital encounter of 10/09/12 (from the past 24 hour(s))  HCG, QUANTITATIVE, PREGNANCY     Status: Abnormal   Collection Time   10/09/12  7:07 PM      Component Value Range   hCG, Beta Chain, Quant, S 20480 (*) <5 mIU/mL  URINALYSIS, ROUTINE W REFLEX MICROSCOPIC     Status: Abnormal   Collection Time   10/09/12  7:13 PM      Component Value Range   Color, Urine YELLOW  YELLOW   APPearance CLEAR  CLEAR   Specific Gravity, Urine >1.030 (*) 1.005 - 1.030   pH 6.0  5.0 - 8.0   Glucose, UA NEGATIVE  NEGATIVE mg/dL   Hgb urine dipstick TRACE (*) NEGATIVE   Bilirubin Urine NEGATIVE  NEGATIVE   Ketones, ur NEGATIVE  NEGATIVE mg/dL   Protein, ur NEGATIVE  NEGATIVE mg/dL   Urobilinogen, UA 0.2  0.0 - 1.0 mg/dL   Nitrite NEGATIVE  NEGATIVE   Leukocytes, UA MODERATE (*) NEGATIVE  URINE MICROSCOPIC-ADD ON     Status: Abnormal   Collection Time   10/09/12  7:13 PM      Component Value Range   Squamous Epithelial / LPF FEW (*) RARE  WBC, UA 11-20  <3 WBC/hpf   RBC / HPF 3-6  <3 RBC/hpf   Bacteria, UA FEW (*) RARE  POCT PREGNANCY, URINE     Status: Abnormal   Collection Time   10/09/12  7:18 PM      Component Value Range   Preg Test, Ur POSITIVE (*) NEGATIVE  WET PREP, GENITAL     Status: Abnormal   Collection Time   10/09/12  7:35 PM      Component Value Range   Yeast Wet Prep HPF POC NONE SEEN  NONE SEEN   Trich, Wet Prep NONE SEEN  NONE SEEN   Clue Cells Wet Prep HPF POC FEW (*) NONE SEEN   WBC, Wet Prep HPF POC MANY (*) NONE SEEN    IMAGING IUP @14 .1 weeks by U/S today  ASSESSMENT 21 y.o. G2P1001 @[redacted]w[redacted]d  by U/S today  1. Normal IUP (intrauterine pregnancy) on prenatal  ultrasound   2. UTI in pregnancy     PLAN Discharge home [facility] pending Macrobid 100 mg BID x7 days Urine sent for culture Pregnancy verification letter and list of providers given Drink plenty of PO fluids Begin prenatal care as soon as possible Return to MAU as needed   Sharen Counter Certified Nurse-Midwife 10/09/2012  7:56 PM

## 2012-10-11 LAB — GC/CHLAMYDIA PROBE AMP, GENITAL
Chlamydia, DNA Probe: POSITIVE — AB
GC Probe Amp, Genital: NEGATIVE

## 2012-10-11 LAB — URINE CULTURE: Colony Count: 65000

## 2012-10-12 LAB — HERPES SIMPLEX VIRUS(HSV) DNA BY PCR: HSV 2 DNA: DETECTED

## 2012-10-13 ENCOUNTER — Other Ambulatory Visit: Payer: Self-pay | Admitting: Advanced Practice Midwife

## 2012-10-13 ENCOUNTER — Telehealth: Payer: Self-pay | Admitting: Advanced Practice Midwife

## 2012-10-13 DIAGNOSIS — B009 Herpesviral infection, unspecified: Secondary | ICD-10-CM | POA: Insufficient documentation

## 2012-10-13 MED ORDER — ACYCLOVIR 400 MG PO TABS: 400.0000 mg | ORAL_TABLET | Freq: Three times a day (TID) | ORAL | Status: DC

## 2012-10-13 NOTE — Progress Notes (Signed)
Pt positive for HSV 2.  Rx for acyclovir sent to pharmacy. Clinic to call pt to inform her of results and Rx.

## 2012-10-13 NOTE — Telephone Encounter (Signed)
Acyclovir sent to pt pharmacy for HSV 2 positive results in MAU.

## 2012-10-14 ENCOUNTER — Telehealth: Payer: Self-pay

## 2012-10-14 DIAGNOSIS — B009 Herpesviral infection, unspecified: Secondary | ICD-10-CM

## 2012-10-14 NOTE — Telephone Encounter (Deleted)
Message copied by Faythe Casa on Fri Oct 14, 2012 11:28 AM ------      Message from: LEFTWICH-KIRBY, LISA A      Created: Thu Oct 13, 2012  7:00 PM       Pt HSV swab came back positive for HSV 2.  I did not treat her at her MAU visit but have sent prescription for acyclovir to pt pharmacy.  Please call her to let her know this is waiting for her.  Thank you.

## 2012-10-14 NOTE — Telephone Encounter (Addendum)
Called pt @ [phone] and spoke to a family member they stated that she was not home but gave me another contact # [phone].  Was unable to leave message due to message not leading to voicemail box.

## 2012-10-14 NOTE — Telephone Encounter (Signed)
Message copied by Faythe Casa on Fri Oct 14, 2012 11:13 AM ------      Message from: LEFTWICH-KIRBY, LISA A      Created: Thu Oct 13, 2012  7:00 PM       Pt HSV swab came back positive for HSV 2.  I did not treat her at her MAU visit but have sent prescription for acyclovir to pt pharmacy.  Please call her to let her know this is waiting for her.  Thank you.

## 2012-10-18 NOTE — Telephone Encounter (Signed)
Called pt @ # [phone] and was told that Tanya Moreno was not there. She will be back around 1 or 2 pm.

## 2012-10-18 NOTE — Telephone Encounter (Signed)
Called pt again and left message to call us back and indicate when she will be available- we have information for her. She may also state whether or not it is ok to leave her information on the voice mail.

## 2012-10-19 NOTE — Telephone Encounter (Signed)
Called patient at [phone], family member answered and stated she wasn't there, left message with her to instruct the patient to please give [place] a call for some information.

## 2012-10-20 ENCOUNTER — Encounter: Payer: Self-pay | Admitting: *Deleted

## 2012-10-20 MED ORDER — ACYCLOVIR 400 MG PO TABS: 400.0000 mg | ORAL_TABLET | Freq: Three times a day (TID) | ORAL | Status: AC

## 2012-10-20 NOTE — Telephone Encounter (Signed)
Called [facility] on [address] to see if patient had picked up her prescription. They stated that they had never received a prescription for acyclovir. I will resend this rx and also send patient a certified letter.

## 2012-12-05 ENCOUNTER — Emergency Department (HOSPITAL_COMMUNITY)
Admission: EM | Admit: 2012-12-05 | Discharge: 2012-12-05 | Disposition: A | Discharge status: Home / Self Care | Payer: Medicaid Other | Attending: Emergency Medicine | Admitting: Emergency Medicine

## 2012-12-05 ENCOUNTER — Encounter (HOSPITAL_COMMUNITY): Payer: Self-pay | Admitting: Cardiology

## 2012-12-05 DIAGNOSIS — Z5320 Procedure and treatment not carried out because of patient's decision for unspecified reasons: Secondary | ICD-10-CM | POA: Insufficient documentation

## 2012-12-05 NOTE — ED Notes (Signed)
Pt reports she is about [redacted] weeks pregnant and concerned because her baby has not had normal movement today. Denies any vaginal bleeding or discharge. No prenatal care so far.

## 2012-12-05 NOTE — ED Notes (Signed)
Called patient to be roomed; no answer.  

## 2012-12-07 NOTE — L&D Delivery Note (Signed)
Delivery Note At 8:54 PM a viable and healthy female was delivered via Vaginal, Spontaneous Delivery (Presentation: ; Occiput Anterior).  APGAR: , ; weight 6+11.   Placenta status: spontaneous, grossly intact Cord: 3 vessels with the following complications: Foot cord x 2.    No difficulty with shoulders  Anesthesia: None  Episiotomy: None Lacerations: None Suture Repair: none Est. Blood Loss (mL): 200   Mom to postpartum.  Baby to nursery-stable.  [facility] 04/12/2013, 9:07 PM

## 2012-12-31 ENCOUNTER — Encounter (HOSPITAL_COMMUNITY): Payer: Self-pay

## 2012-12-31 ENCOUNTER — Inpatient Hospital Stay (HOSPITAL_COMMUNITY)
Admission: AD | Admit: 2012-12-31 | Discharge: 2012-12-31 | Disposition: A | Discharge status: Home / Self Care | Payer: Medicaid Other | Source: Intra-hospital | Attending: Obstetrics and Gynecology | Admitting: Obstetrics and Gynecology

## 2012-12-31 DIAGNOSIS — N390 Urinary tract infection, site not specified: Secondary | ICD-10-CM | POA: Insufficient documentation

## 2012-12-31 DIAGNOSIS — O98819 Other maternal infectious and parasitic diseases complicating pregnancy, unspecified trimester: Secondary | ICD-10-CM | POA: Insufficient documentation

## 2012-12-31 DIAGNOSIS — O2340 Unspecified infection of urinary tract in pregnancy, unspecified trimester: Secondary | ICD-10-CM

## 2012-12-31 DIAGNOSIS — R109 Unspecified abdominal pain: Secondary | ICD-10-CM | POA: Insufficient documentation

## 2012-12-31 DIAGNOSIS — A5901 Trichomonal vulvovaginitis: Secondary | ICD-10-CM | POA: Insufficient documentation

## 2012-12-31 DIAGNOSIS — O2390 Unspecified genitourinary tract infection in pregnancy, unspecified trimester: Secondary | ICD-10-CM | POA: Insufficient documentation

## 2012-12-31 DIAGNOSIS — A599 Trichomoniasis, unspecified: Secondary | ICD-10-CM

## 2012-12-31 DIAGNOSIS — O0930 Supervision of pregnancy with insufficient antenatal care, unspecified trimester: Secondary | ICD-10-CM | POA: Insufficient documentation

## 2012-12-31 LAB — WET PREP, GENITAL: Yeast Wet Prep HPF POC: NONE SEEN

## 2012-12-31 LAB — URINE MICROSCOPIC-ADD ON

## 2012-12-31 LAB — URINALYSIS, ROUTINE W REFLEX MICROSCOPIC
Glucose, UA: NEGATIVE mg/dL
Nitrite: NEGATIVE
Protein, ur: NEGATIVE mg/dL

## 2012-12-31 MED ORDER — METRONIDAZOLE 500 MG PO TABS
2000.0000 mg | ORAL_TABLET | Freq: Once | ORAL | Status: AC
  Administered 2012-12-31: 2000 mg via ORAL
  Filled 2012-12-31: qty 4

## 2012-12-31 MED ORDER — NITROFURANTOIN MONOHYD MACRO 100 MG PO CAPS: 100.0000 mg | ORAL_CAPSULE | Freq: Two times a day (BID) | ORAL | Status: DC

## 2012-12-31 NOTE — MAU Provider Note (Signed)
Attestation of Attending Supervision of Advanced Practitioner (CNM/NP): Evaluation and management procedures were performed by the Advanced Practitioner under my supervision and collaboration.  I have reviewed the Advanced Practitioner's note and chart, and I agree with the management and plan.  Jaelen Soth 12/31/2012 9:33 PM   

## 2012-12-31 NOTE — MAU Provider Note (Signed)
History     CSN: [id number]  Arrival date and time: 12/31/12 1518   None     Chief Complaint  Patient presents with  . Abdominal Pain   HPI Tanya Moreno is a 22 y.o. G18P1001 female at [redacted]w[redacted]d who presents w/ report of intermittent sharp lower abdominal pain and pressure that began earlier this am.  Denies lof or vb.  Reports good fm.  Unsure if uc's.  No pnc to date d/t waiting on medicaid- plans on going to [facility].  Increased urinary frequency and urgency x 3wks, denies dysuria or hesitancy.  Denies abnormal or malodorous vag d/c or vulvovaginal itching/irritation.  No recent sexual intercourse.  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     OB History    Grav Para Term Preterm Abortions TAB SAB Ect Mult Living   2 1 1       1       Past Medical History  Diagnosis Date  . No pertinent past medical history   . Gonorrhea   . Chlamydia     Past Surgical History  Procedure Date  . No past surgeries     No family history on file.  History  Substance Use Topics  . Smoking status: Never Smoker   . Smokeless tobacco: Never Used  . Alcohol Use: No    Allergies:  Allergies  Allergen Reactions  . Latex Swelling    No prescriptions prior to admission    Review of Systems  Constitutional: Negative.  Negative for fever and chills.  HENT: Negative.   Eyes: Negative.   Respiratory: Negative.   Cardiovascular: Negative.   Gastrointestinal: Positive for abdominal pain (intermittent sharp lower abdominal pain). Negative for diarrhea and constipation.  Genitourinary: Positive for urgency and frequency. Negative for dysuria, hematuria and flank pain.  Musculoskeletal: Negative.   Skin: Negative.   Neurological: Negative.   Endo/Heme/Allergies: Negative.   Psychiatric/Behavioral: Negative.    Physical Exam   Blood pressure 122/62, pulse 87, temperature 96.7 F (35.9 C), temperature source Oral, resp. rate 16, last menstrual  period 07/26/2012.  Physical Exam  Constitutional: She is oriented to person, place, and time. She appears well-developed and well-nourished.  HENT:  Head: Normocephalic.  Neck: Normal range of motion.  Cardiovascular: Normal rate.   Respiratory: Effort normal.  GI: Soft. There is tenderness (suprapubic).       Soft, gravid  Genitourinary: Vagina normal and uterus normal.       No active lesions  Spec: cervix visually closed, mod amt thin greyish-white frothy malodorous d/c. GC/CT, wet prep obtained  SVE: outer os 2-3/inner os closed/thick/-3  Musculoskeletal: Normal range of motion.  Neurological: She is alert and oriented to person, place, and time.  Skin: Skin is warm and dry.  Psychiatric: She has a normal mood and affect. Her behavior is normal. Judgment and thought content normal.   FHR: 140, mod, 10x10 accels, no decels= Cat I UCs: none  MAU Course  Procedures  UA, C&S EFM Spec exam w/ wet prep, gc/ct SVE  Results for orders placed during the hospital encounter of 12/31/12 (from the past 24 hour(s))  URINALYSIS, ROUTINE W REFLEX MICROSCOPIC     Status: Abnormal   Collection Time   12/31/12  3:20 PM      Component Value Range   Color, Urine YELLOW  YELLOW   APPearance HAZY (*) CLEAR   Specific Gravity, Urine 1.020  1.005 - 1.030  pH 7.5  5.0 - 8.0   Glucose, UA NEGATIVE  NEGATIVE mg/dL   Hgb urine dipstick NEGATIVE  NEGATIVE   Bilirubin Urine NEGATIVE  NEGATIVE   Ketones, ur NEGATIVE  NEGATIVE mg/dL   Protein, ur NEGATIVE  NEGATIVE mg/dL   Urobilinogen, UA 0.2  0.0 - 1.0 mg/dL   Nitrite NEGATIVE  NEGATIVE   Leukocytes, UA LARGE (*) NEGATIVE  URINE MICROSCOPIC-ADD ON     Status: Abnormal   Collection Time   12/31/12  3:20 PM      Component Value Range   Squamous Epithelial / LPF FEW (*) RARE   WBC, UA 11-20  <3 WBC/hpf   RBC / HPF 0-2  <3 RBC/hpf   Bacteria, UA MANY (*) RARE   Urine-Other TRICHOMONAS PRESENT    WET PREP, GENITAL     Status: Abnormal    Collection Time   12/31/12  4:05 PM      Component Value Range   Yeast Wet Prep HPF POC NONE SEEN  NONE SEEN   Trich, Wet Prep FEW (*) NONE SEEN   Clue Cells Wet Prep HPF POC NONE SEEN  NONE SEEN   WBC, Wet Prep HPF POC TOO NUMEROUS TO COUNT (*) NONE SEEN   Follow-up Information    Schedule an appointment as soon as possible for a visit with [facility]. (to initiate your prenatal care.  If you are unable to be seen due to not having your medicaid yet, you can call the clinic at [facility] at [phone].)    Contact information:   [address] [place] [address] [phone]         Mikaelyn, Arthurs  Home Medication Instructions FAO:[id number]   Printed on:12/31/12 1653  Medication Information                    nitrofurantoin, macrocrystal-monohydrate, (MACROBID) 100 MG capsule Take 1 capsule (100 mg total) by mouth 2 (two) times daily.              Assessment and Plan  A:  [redacted]w[redacted]d SIUP  Presumed UTI- will treat based on symptoms  Trichomonas-metronidazole 2gm po given in mau  No pnc  Cat I FHR   P:  D/C home  To call [facility] asap to initiate care- if unable to d/t medicaid pending can call clinic if wishes  Rx macrobid  Increase po fluids    Marge Duncans 12/31/2012, 3:57 PM

## 2012-12-31 NOTE — MAU Note (Signed)
Patient arrives via EMS with c/o onset of lower abdominal pain and pressure about one hour ago, denies vaginal bleeding or discharge, has not received prenatal care still awaiting her Medicaid plans to go to [facility].

## 2013-01-02 LAB — URINE CULTURE: Colony Count: 50000

## 2013-03-29 ENCOUNTER — Encounter: Payer: Self-pay | Admitting: Obstetrics

## 2013-03-31 ENCOUNTER — Inpatient Hospital Stay (HOSPITAL_COMMUNITY)
Admission: AD | Admit: 2013-03-31 | Discharge: 2013-03-31 | Disposition: A | Discharge status: Home / Self Care | Payer: Medicaid Other | Source: Ambulatory Visit | Attending: Obstetrics & Gynecology | Admitting: Obstetrics & Gynecology

## 2013-03-31 ENCOUNTER — Encounter (HOSPITAL_COMMUNITY): Payer: Self-pay | Admitting: *Deleted

## 2013-03-31 DIAGNOSIS — A5901 Trichomonal vulvovaginitis: Secondary | ICD-10-CM

## 2013-03-31 DIAGNOSIS — O98819 Other maternal infectious and parasitic diseases complicating pregnancy, unspecified trimester: Secondary | ICD-10-CM

## 2013-03-31 DIAGNOSIS — O0933 Supervision of pregnancy with insufficient antenatal care, third trimester: Secondary | ICD-10-CM

## 2013-03-31 DIAGNOSIS — A5909 Other urogenital trichomoniasis: Secondary | ICD-10-CM

## 2013-03-31 DIAGNOSIS — O479 False labor, unspecified: Secondary | ICD-10-CM | POA: Insufficient documentation

## 2013-03-31 DIAGNOSIS — O0930 Supervision of pregnancy with insufficient antenatal care, unspecified trimester: Secondary | ICD-10-CM | POA: Insufficient documentation

## 2013-03-31 DIAGNOSIS — O368190 Decreased fetal movements, unspecified trimester, not applicable or unspecified: Secondary | ICD-10-CM | POA: Insufficient documentation

## 2013-03-31 LAB — WET PREP, GENITAL: Yeast Wet Prep HPF POC: NONE SEEN

## 2013-03-31 LAB — CBC
HCT: 32.6 % — ABNORMAL LOW (ref 36.0–46.0)
Hemoglobin: 10.8 g/dL — ABNORMAL LOW (ref 12.0–15.0)
MCH: 28 pg (ref 26.0–34.0)
MCHC: 33.1 g/dL (ref 30.0–36.0)
MCV: 84.5 fL (ref 78.0–100.0)
RBC: 3.86 MIL/uL — ABNORMAL LOW (ref 3.87–5.11)

## 2013-03-31 LAB — RAPID URINE DRUG SCREEN, HOSP PERFORMED: Opiates: NOT DETECTED

## 2013-03-31 LAB — OB RESULTS CONSOLE GC/CHLAMYDIA
Chlamydia: NEGATIVE
Gonorrhea: NEGATIVE

## 2013-03-31 MED ORDER — ACYCLOVIR 200 MG/5ML PO SUSP: 400.0000 mg | Freq: Three times a day (TID) | ORAL | Status: DC

## 2013-03-31 MED ORDER — CEFTRIAXONE SODIUM 250 MG IJ SOLR
250.0000 mg | Freq: Once | INTRAMUSCULAR | Status: AC
  Administered 2013-03-31: 250 mg via INTRAMUSCULAR
  Filled 2013-03-31: qty 250

## 2013-03-31 MED ORDER — METRONIDAZOLE 50 MG/ML ORAL SUSPENSION: 2000.0000 mg | Freq: Once | ORAL | Status: DC

## 2013-03-31 MED ORDER — AZITHROMYCIN 1 G PO PACK
1.0000 g | PACK | Freq: Once | ORAL | Status: AC
  Administered 2013-03-31: 1 g via ORAL
  Filled 2013-03-31: qty 1

## 2013-03-31 NOTE — MAU Provider Note (Signed)
History     CSN: [id number]  Arrival date and time: 03/31/13 1517   First Provider Initiated Contact with Patient 03/31/13 1636      Chief Complaint  Patient presents with  . Labor Eval  . Decreased Fetal Movement   HPI 22 y.o. G2P1001 at [redacted]w[redacted]d with contractions for past 3-4 hours, better now. No bleeding or loss of fluid. Some vaginal discharge. No dysuria.  Baby moving now. Was not moving much earlier today but normal yesterday as well. No prenatal care with this pregnancy or first. States didn't have medicaid, called clinic several times and couldn't get appointment. Also having vomiting after eating - does have heartburn/reflux.   OB History   Grav Para Term Preterm Abortions TAB SAB Ect Mult Living   2 1 1       1       Past Medical History  Diagnosis Date  . No pertinent past medical history   . Gonorrhea   . Chlamydia   . Medical history non-contributory     Past Surgical History  Procedure Laterality Date  . No past surgeries      Family History  Problem Relation Age of Onset  . Heart disease Father   . Kidney disease Maternal Aunt     History  Substance Use Topics  . Smoking status: Never Smoker   . Smokeless tobacco: Never Used  . Alcohol Use: No    Allergies:  Allergies  Allergen Reactions  . Latex Swelling    Prescriptions prior to admission  Medication Sig Dispense Refill  . nitrofurantoin, macrocrystal-monohydrate, (MACROBID) 100 MG capsule Take 1 capsule (100 mg total) by mouth 2 (two) times daily.  14 capsule  0    ROS  No fever/chills. No headache, vision changes, dizziness.  Physical Exam   Blood pressure 127/76, pulse 99, temperature 98 F (36.7 C), temperature source Oral, resp. rate 16, height 5\' 3"  (1.6 m), weight 95.618 kg (210 lb 12.8 oz), last menstrual period 07/26/2012, SpO2 100.00%.  Physical Exam GEN:  Wnwd, no distress HEENT:  NCAT, EOMI, normal conjunctiva CV:  RRR, no m/g/r RESP:  CTAB ABD:  Gravid, size appr for  dates, non-tender EXTREM:  Wwp, no edema NEURO:  Alert, oriented, no focal deficits PELV:  Normal external genitalia, normal vagina with moderate foamy white/yellow discharge, no blood. Cervix closed/soft/50%/-2.  Results for orders placed during the hospital encounter of 03/31/13 (from the past 24 hour(s))  WET PREP, GENITAL     Status: Abnormal   Collection Time    03/31/13  4:50 PM      Result Value Range   Yeast Wet Prep HPF POC NONE SEEN  NONE SEEN   Trich, Wet Prep FEW (*) NONE SEEN   Clue Cells Wet Prep HPF POC NONE SEEN  NONE SEEN   WBC, Wet Prep HPF POC MODERATE (*) NONE SEEN  URINE RAPID DRUG SCREEN (HOSP PERFORMED)     Status: None   Collection Time    03/31/13  4:50 PM      Result Value Range   Opiates NONE DETECTED  NONE DETECTED   Cocaine NONE DETECTED  NONE DETECTED   Benzodiazepines NONE DETECTED  NONE DETECTED   Amphetamines NONE DETECTED  NONE DETECTED   Tetrahydrocannabinol NONE DETECTED  NONE DETECTED   Barbiturates NONE DETECTED  NONE DETECTED  CBC     Status: Abnormal   Collection Time    03/31/13  5:05 PM      Result Value Range  WBC 8.7  4.0 - 10.5 K/uL   RBC 3.86 (*) 3.87 - 5.11 MIL/uL   Hemoglobin 10.8 (*) 12.0 - 15.0 g/dL   HCT 69.6 (*) 29.5 - 28.4 %   MCV 84.5  78.0 - 100.0 fL   MCH 28.0  26.0 - 34.0 pg   MCHC 33.1  30.0 - 36.0 g/dL   RDW 13.2  44.0 - 10.2 %   Platelets 261  150 - 400 K/uL  RPR     Status: None   Collection Time    03/31/13  5:05 PM      Result Value Range   RPR NON REACTIVE  NON REACTIVE  HIV ANTIBODY (ROUTINE TESTING)     Status: None   Collection Time    03/31/13  5:05 PM      Result Value Range   HIV NON REACTIVE  NON REACTIVE  HEPATITIS B SURFACE ANTIGEN     Status: None   Collection Time    03/31/13  5:05 PM      Result Value Range   Hepatitis B Surface Ag NEGATIVE  NEGATIVE     MAU Course  Procedures  FHTs:  130, moderate variability, accels present, no decels TOCO:  Irritability with occasional  contraction    Assessment and Plan  22 y.o. G2P1001 at [redacted]w[redacted]d with no prenatal care, contractions. - FHTs reactive - Cervix closed, not in labor - Trichomonas - hx of gonorrhea and chlamydia - given rocephin, azithromycin and rx for flagyl (can only take liquids and liquid version not available at [facility]). 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 Given Rx for acyclovir for suppression. No active lesions on exam. - GBS done, pending. Other prenatal labs done. - Ultrasound at 14 weeks showed posterior placenta, EDD 04/08/13.  - Note sent to clinic for appt. Labor precautions discussed. - Stable for discharge home  Napoleon Form 03/31/2013, 4:41 PM

## 2013-03-31 NOTE — MAU Note (Signed)
Patient has not had any prenatal care except visits to MAU.

## 2013-03-31 NOTE — Discharge Instructions (Signed)
Trichomoniasis Trichomoniasis is an infection, caused by the Trichomonas organism, that affects both women and men. In women, the outer female genitalia and the vagina are affected. In men, the penis is mainly affected, but the prostate and other reproductive organs can also be involved. Trichomoniasis is a sexually transmitted disease (STD) and is most often passed to another person through sexual contact. The majority of people who get trichomoniasis do so from a sexual encounter and are also at risk for other STDs. CAUSES   Sexual intercourse with an infected partner.  It can be present in swimming pools or hot tubs. SYMPTOMS   Abnormal gray-green frothy vaginal discharge in women.  Vaginal itching and irritation in women.  Itching and irritation of the area outside the vagina in women.  Penile discharge with or without pain in males.  Inflammation of the urethra (urethritis), causing painful urination.  Bleeding after sexual intercourse. RELATED COMPLICATIONS  Pelvic inflammatory disease.  Infection of the uterus (endometritis).  Infertility.  Tubal (ectopic) pregnancy.  It can be associated with other STDs, including gonorrhea and chlamydia, hepatitis B, and HIV. COMPLICATIONS DURING PREGNANCY  Early (premature) delivery.  Premature rupture of the membranes (PROM).  Low birth weight. DIAGNOSIS   Visualization of Trichomonas under the microscope from the vagina discharge.  Ph of the vagina greater than 4.5, tested with a test tape.  Trich Rapid Test.  Culture of the organism, but this is not usually needed.  It may be found on a Pap test.  Having a "strawberry cervix,"which means the cervix looks very red like a strawberry. TREATMENT   You may be given medication to fight the infection. Inform your caregiver if you could be or are pregnant. Some medications used to treat the infection should not be taken during pregnancy.  Over-the-counter medications or  creams to decrease itching or irritation may be recommended.  Your sexual partner will need to be treated if infected. HOME CARE INSTRUCTIONS   Take all medication prescribed by your caregiver.  Take over-the-counter medication for itching or irritation as directed by your caregiver.  Do not have sexual intercourse while you have the infection.  Do not douche or wear tampons.  Discuss your infection with your partner, as your partner may have acquired the infection from you. Or, your partner may have been the person who transmitted the infection to you.  Have your sex partner examined and treated if necessary.  Practice safe, informed, and protected sex.  See your caregiver for other STD testing. SEEK MEDICAL CARE IF:   You still have symptoms after you finish the medication.  You have an oral temperature above 102 F (38.9 C).  You develop belly (abdominal) pain.  You have pain when you urinate.  You have bleeding after sexual intercourse.  You develop a rash.  The medication makes you sick or makes you throw up (vomit). Document Released: 05/19/2001 Document Revised: 02/15/2012 Document Reviewed: 06/14/2009 [facility] Patient Information 2013 [place], [place].  Sexually Transmitted Disease Sexually transmitted disease (STD) refers to any infection that is passed from person to person during sexual activity. This may happen by way of saliva, semen, blood, vaginal mucus, or urine. Common STDs include:  Gonorrhea.  Chlamydia.  Syphilis.  HIV/AIDS.  Genital herpes.  Hepatitis B and C.  Trichomonas.  Human papillomavirus (HPV).  Pubic lice. CAUSES  An STD may be spread by bacteria, virus, or parasite. A person can get an STD by:  Sexual intercourse with an infected person.  Sharing  sex toys with an infected person.  Sharing needles with an infected person.  Having intimate contact with the genitals, mouth, or rectal areas of an infected person. SYMPTOMS    Some people may not have any symptoms, but they can still pass the infection to others. Different STDs have different symptoms. Symptoms include:  Painful or bloody urination.  Pain in the pelvis, abdomen, vagina, anus, throat, or eyes.  Skin rash, itching, irritation, growths, or sores (lesions). These usually occur in the genital or anal area.  Abnormal vaginal discharge.  Penile discharge in men.  Soft, flesh-colored skin growths in the genital or anal area.  Fever.  Pain or bleeding during sexual intercourse.  Swollen glands in the groin area.  Yellow skin and eyes (jaundice). This is seen with hepatitis. DIAGNOSIS  To make a diagnosis, your caregiver may:  Take a medical history.  Perform a physical exam.  Take a specimen (culture) to be examined.  Examine a sample of discharge under a microscope.  Perform blood tests.  Perform a Pap test, if this applies.  Perform a colposcopy.  Perform a laparoscopy. TREATMENT   Chlamydia, gonorrhea, trichomonas, and syphilis can be cured with antibiotic medicine.  Genital herpes, hepatitis, and HIV can be treated, but not cured, with prescribed medicines. The medicines will lessen the symptoms.  Genital warts from HPV can be treated with medicine or by freezing, burning (electrocautery), or surgery. Warts may come back.  HPV is a virus and cannot be cured with medicine or surgery.However, abnormal areas may be followed very closely by your caregiver and may be removed from the cervix, vagina, or vulva through office procedures or surgery. If your diagnosis is confirmed, your recent sexual partners need treatment. This is true even if they are symptom-free or have a negative culture or evaluation. They should not have sex until their caregiver says it is okay. HOME CARE INSTRUCTIONS  All sexual partners should be informed, tested, and treated for all STDs.  Take your antibiotics as directed. Finish them even if you start  to feel better.  Only take over-the-counter or prescription medicines for pain, discomfort, or fever as directed by your caregiver.  Rest.  Eat a balanced diet and drink enough fluids to keep your urine clear or pale yellow.  Do not have sex until treatment is completed and you have followed up with your caregiver. STDs should be checked after treatment.  Keep all follow-up appointments, Pap tests, and blood tests as directed by your caregiver.  Only use latex condoms and water-soluble lubricants during sexual activity. Do not use petroleum jelly or oils.  Avoid alcohol and illegal drugs.  Get vaccinated for HPV and hepatitis. If you have not received these vaccines in the past, talk to your caregiver about whether one or both might be right for you.  Avoid risky sex practices that can break the skin. The only way to avoid getting an STD is to avoid all sexual activity.Latex condoms and dental dams (for oral sex) will help lessen the risk of getting an STD, but will not completely eliminate the risk. SEEK MEDICAL CARE IF:   You have a fever.  You have any new or worsening symptoms. Document Released: 02/13/2003 Document Revised: 02/15/2012 Document Reviewed: 02/20/2011 [facility] Patient Information 2013 [place], [place].  Pregnancy - Third Trimester The third trimester of pregnancy (the last 3 months) is a period of the most rapid growth for you and your baby. The baby approaches a length of 20 inches  and a weight of 6 to 10 pounds. The baby is adding on fat and getting ready for life outside your body. While inside, babies have periods of sleeping and waking, suck their thumbs, and hiccups. You can often feel small contractions of the uterus. This is false labor. It is also called Braxton-Hicks contractions. This is like a practice for labor. The usual problems in this stage of pregnancy include more difficulty breathing, swelling of the hands and feet from water retention, and having to  urinate more often because of the uterus and baby pressing on your bladder.  PRENATAL EXAMS  Blood work may continue to be done during prenatal exams. These tests are done to check on your health and the probable health of your baby. Blood work is used to follow your blood levels (hemoglobin). Anemia (low hemoglobin) is common during pregnancy. Iron and vitamins are given to help prevent this. You may also continue to be checked for diabetes. Some of the past blood tests may be done again.  The size of the uterus is measured during each visit. This makes sure your baby is growing properly according to your pregnancy dates.  Your blood pressure is checked every prenatal visit. This is to make sure you are not getting toxemia.  Your urine is checked every prenatal visit for infection, diabetes and protein.  Your weight is checked at each visit. This is done to make sure gains are happening at the suggested rate and that you and your baby are growing normally.  Sometimes, an ultrasound is performed to confirm the position and the proper growth and development of the baby. This is a test done that bounces harmless sound waves off the baby so your caregiver can more accurately determine due dates.  Discuss the type of pain medication and anesthesia you will have during your labor and delivery.  Discuss the possibility and anesthesia if a Cesarean Section might be necessary.  Inform your caregiver if there is any mental or physical violence at home. Sometimes, a specialized non-stress test, contraction stress test and biophysical profile are done to make sure the baby is not having a problem. Checking the amniotic fluid surrounding the baby is called an amniocentesis. The amniotic fluid is removed by sticking a needle into the belly (abdomen). This is sometimes done near the end of pregnancy if an early delivery is required. In this case, it is done to help make sure the baby's lungs are mature enough  for the baby to live outside of the womb. If the lungs are not mature and it is unsafe to deliver the baby, an injection of cortisone medication is given to the mother 1 to 2 days before the delivery. This helps the baby's lungs mature and makes it safer to deliver the baby. CHANGES OCCURING IN THE THIRD TRIMESTER OF PREGNANCY Your body goes through many changes during pregnancy. They vary from person to person. Talk to your caregiver about changes you notice and are concerned about.  During the last trimester, you have probably had an increase in your appetite. It is normal to have cravings for certain foods. This varies from person to person and pregnancy to pregnancy.  You may begin to get stretch marks on your hips, abdomen, and breasts. These are normal changes in the body during pregnancy. There are no exercises or medications to take which prevent this change.  Constipation may be treated with a stool softener or adding bulk to your diet. Drinking lots of fluids,  fiber in vegetables, fruits, and whole grains are helpful.  Exercising is also helpful. If you have been very active up until your pregnancy, most of these activities can be continued during your pregnancy. If you have been less active, it is helpful to start an exercise program such as walking. Consult your caregiver before starting exercise programs.  Avoid all smoking, alcohol, un-prescribed drugs, herbs and "street drugs" during your pregnancy. These chemicals affect the formation and growth of the baby. Avoid chemicals throughout the pregnancy to ensure the delivery of a healthy infant.  Backache, varicose veins and hemorrhoids may develop or get worse.  You will tire more easily in the third trimester, which is normal.  The baby's movements may be stronger and more often.  You may become short of breath easily.  Your belly button may stick out.  A yellow discharge may leak from your breasts called colostrum.  You may  have a bloody mucus discharge. This usually occurs a few days to a week before labor begins. HOME CARE INSTRUCTIONS   Keep your caregiver's appointments. Follow your caregiver's instructions regarding medication use, exercise, and diet.  During pregnancy, you are providing food for you and your baby. Continue to eat regular, well-balanced meals. Choose foods such as meat, fish, milk and other low fat dairy products, vegetables, fruits, and whole-grain breads and cereals. Your caregiver will tell you of the ideal weight gain.  A physical sexual relationship may be continued throughout pregnancy if there are no other problems such as early (premature) leaking of amniotic fluid from the membranes, vaginal bleeding, or belly (abdominal) pain.  Exercise regularly if there are no restrictions. Check with your caregiver if you are unsure of the safety of your exercises. Greater weight gain will occur in the last 2 trimesters of pregnancy. Exercising helps:  Control your weight.  Get you in shape for labor and delivery.  You lose weight after you deliver.  Rest a lot with legs elevated, or as needed for leg cramps or low back pain.  Wear a good support or jogging bra for breast tenderness during pregnancy. This may help if worn during sleep. Pads or tissues may be used in the bra if you are leaking colostrum.  Do not use hot tubs, steam rooms, or saunas.  Wear your seat belt when driving. This protects you and your baby if you are in an accident.  Avoid raw meat, cat litter boxes and soil used by cats. These carry germs that can cause birth defects in the baby.  It is easier to loose urine during pregnancy. Tightening up and strengthening the pelvic muscles will help with this problem. You can practice stopping your urination while you are going to the bathroom. These are the same muscles you need to strengthen. It is also the muscles you would use if you were trying to stop from passing gas. You  can practice tightening these muscles up 10 times a set and repeating this about 3 times per day. Once you know what muscles to tighten up, do not perform these exercises during urination. It is more likely to cause an infection by backing up the urine.  Ask for help if you have financial, counseling or nutritional needs during pregnancy. Your caregiver will be able to offer counseling for these needs as well as refer you for other special needs.  Make a list of emergency phone numbers and have them available.  Plan on getting help from family or friends when you  go home from the hospital.  Make a trial run to the hospital.  Take prenatal classes with the father to understand, practice and ask questions about the labor and delivery.  Prepare the baby's room/nursery.  Do not travel out of the city unless it is absolutely necessary and with the advice of your caregiver.  Wear only low or no heal shoes to have better balance and prevent falling. MEDICATIONS AND DRUG USE IN PREGNANCY  Take prenatal vitamins as directed. The vitamin should contain 1 milligram of folic acid. Keep all vitamins out of reach of children. Only a couple vitamins or tablets containing iron may be fatal to a baby or young child when ingested.  Avoid use of all medications, including herbs, over-the-counter medications, not prescribed or suggested by your caregiver. Only take over-the-counter or prescription medicines for pain, discomfort, or fever as directed by your caregiver. Do not use aspirin, ibuprofen (Motrin, Advil, Nuprin) or naproxen (Aleve) unless OK'd by your caregiver.  Let your caregiver also know about herbs you may be using.  Alcohol is related to a number of birth defects. This includes fetal alcohol syndrome. All alcohol, in any form, should be avoided completely. Smoking will cause low birth rate and premature babies.  Street/illegal drugs are very harmful to the baby. They are absolutely  forbidden. A baby born to an addicted mother will be addicted at birth. The baby will go through the same withdrawal an adult does. SEEK MEDICAL CARE IF: You have any concerns or worries during your pregnancy. It is better to call with your questions if you feel they cannot wait, rather than worry about them. DECISIONS ABOUT CIRCUMCISION You may or may not know the sex of your baby. If you know your baby is a boy, it may be time to think about circumcision. Circumcision is the removal of the foreskin of the penis. This is the skin that covers the sensitive end of the penis. There is no proven medical need for this. Often this decision is made on what is popular at the time or based upon religious beliefs and social issues. You can discuss these issues with your caregiver or pediatrician. SEEK IMMEDIATE MEDICAL CARE IF:   An unexplained oral temperature above 102 F (38.9 C) develops, or as your caregiver suggests.  You have leaking of fluid from the vagina (birth canal). If leaking membranes are suspected, take your temperature and tell your caregiver of this when you call.  There is vaginal spotting, bleeding or passing clots. Tell your caregiver of the amount and how many pads are used.  You develop a bad smelling vaginal discharge with a change in the color from clear to white.  You develop vomiting that lasts more than 24 hours.  You develop chills or fever.  You develop shortness of breath.  You develop burning on urination.  You loose more than 2 pounds of weight or gain more than 2 pounds of weight or as suggested by your caregiver.  You notice sudden swelling of your face, hands, and feet or legs.  You develop belly (abdominal) pain. Round ligament discomfort is a common non-cancerous (benign) cause of abdominal pain in pregnancy. Your caregiver still must evaluate you.  You develop a severe headache that does not go away.  You develop visual problems, blurred or double  vision.  If you have not felt your baby move for more than 1 hour. If you think the baby is not moving as much as usual, eat something  with sugar in it and lie down on your left side for an hour. The baby should move at least 4 to 5 times per hour. Call right away if your baby moves less than that.  You fall, are in a car accident or any kind of trauma.  There is mental or physical violence at home. Document Released: 11/17/2001 Document Revised: 02/15/2012 Document Reviewed: 05/22/2009 [facility] Patient Information 2013 [place], [place].  Normal Labor and Delivery Your caregiver must first be sure you are in labor. Signs of labor include:  You may pass what is called "the mucus plug" before labor begins. This is a small amount of blood stained mucus.  Regular uterine contractions.  The time between contractions get closer together.  The discomfort and pain gradually gets more intense.  Pains are mostly located in the back.  Pains get worse when walking.  The cervix (the opening of the uterus becomes thinner (begins to efface) and opens up (dilates). Once you are in labor and admitted into the hospital or care center, your caregiver will do the following:  A complete physical examination.  Check your vital signs (blood pressure, pulse, temperature and the fetal heart rate).  Do a vaginal examination (using a sterile glove and lubricant) to determine:  The position (presentation) of the baby (head [vertex] or buttock first).  The level (station) of the baby's head in the birth canal.  The effacement and dilatation of the cervix.  You may have your pubic hair shaved and be given an enema depending on your caregiver and the circumstance.  An electronic monitor is usually placed on your abdomen. The monitor follows the length and intensity of the contractions, as well as the baby's heart rate.  Usually, your caregiver will insert an IV in your arm with a bottle of sugar water.  This is done as a precaution so that medications can be given to you quickly during labor or delivery. NORMAL LABOR AND DELIVERY IS DIVIDED UP INTO 3 STAGES: First Stage This is when regular contractions begin and the cervix begins to efface and dilate. This stage can last from 3 to 15 hours. The end of the first stage is when the cervix is 100% effaced and 10 centimeters dilated. Pain medications may be given by   Injection (morphine, demerol, etc.)  Regional anesthesia (spinal, caudal or epidural, anesthetics given in different locations of the spine). Paracervical pain medication may be given, which is an injection of and anesthetic on each side of the cervix. A pregnant woman may request to have "Natural Childbirth" which is not to have any medications or anesthesia during her labor and delivery. Second Stage This is when the baby comes down through the birth canal (vagina) and is born. This can take 1 to 4 hours. As the baby's head comes down through the birth canal, you may feel like you are going to have a bowel movement. You will get the urge to bear down and push until the baby is delivered. As the baby's head is being delivered, the caregiver will decide if an episiotomy (a cut in the perineum and vagina area) is needed to prevent tearing of the tissue in this area. The episiotomy is sewn up after the delivery of the baby and placenta. Sometimes a mask with nitrous oxide is given for the mother to breath during the delivery of the baby to help if there is too much pain. The end of Stage 2 is when the baby is fully delivered.  Then when the umbilical cord stops pulsating it is clamped and cut. Third Stage The third stage begins after the baby is completely delivered and ends after the placenta (afterbirth) is delivered. This usually takes 5 to 30 minutes. After the placenta is delivered, a medication is given either by intravenous or injection to help contract the uterus and prevent bleeding. The  third stage is not painful and pain medication is usually not necessary. If an episiotomy was done, it is repaired at this time. After the delivery, the mother is watched and monitored closely for 1 to 2 hours to make sure there is no postpartum bleeding (hemorrhage). If there is a lot of bleeding, medication is given to contract the uterus and stop the bleeding. Document Released: 09/01/2008 Document Revised: 02/15/2012 Document Reviewed: 09/01/2008 [facility] Patient Information 2013 [place], [place].  Herpes During and After Pregnancy Genital herpes is a sexually transmitted infection (STI). It is caused by a virus and can be very serious during pregnancy. The greatest concern is passing the virus to the fetus and newborn. The virus is more likely to be passed to the newborn during delivery if the mother becomes infected for the first time late in her pregnancy (primary infection). It is less common for the virus to pass to the newborn if the mother had herpes before becoming pregnant. This is because antibodies against the virus develop over a period of time. These antibodies help protect the baby. Lastly, the infection can pass to the fetus through the placenta. This can happen if the mother gets herpes for the first time in the first 3 months of her pregnancy (first trimester). This can possibly cause a miscarriage or birth defects in the baby. TREATMENT DURING PREGNANCY Medicines may be prescribed that are safe for the mother and the fetus. The medicine can lessen symptoms or prevent a recurrence of the infection. If the infection happened before becoming pregnant, medicine may be prescribed in the last 4 weeks of the pregnancy. This can prevent a recurrent infection at the time of delivery.  RECOMMENDED DELIVERY METHOD If an active, recurrent infection is present at the time delivery, the baby should be delivered by cesarean delivery. This is because the virus can pass to the baby through an infected  birth canal. This can cause severe problems for the baby. If the infection happens for the first time late in the pregnancy, the caregiver may also recommend a cesarean delivery. With a new infection, the body has not had the time to build up enough antibodies against the virus to protect the baby from getting the infection. Even if the birth canal does not have visible sores (lesions) from the herpes virus, there is still that chance that the virus can spread to the baby. A cesarean delivery should be done if there is any signs or feeling of an infection being present in the genital area. Cesarean delivery is not recommended for women with a history of herpes infection but no evidence of active genital lesions at the time of delivery. Lesions that have crusted fully are considered healed and not active. BREASTFEEDING  Women infected with genital herpes can breastfeed their baby. The virus will not be present in the breast milk. If lesions are present on the breast, the baby should not breastfeed from the affected breast(s). HOW TO PREVENT PASSING HERPES TO YOUR BABY AFTER DELIVERY  Wash your hands with soap and water often and before touching your baby.  If you have an outbreak, keep  the area clean and covered.  Try to avoid physical and stressful situations that may bring on an outbreak. SEEK IMMEDIATE MEDICAL CARE IF:   You have an outbreak during pregnancy and cannot urinate.  You have an outbreak anytime during your pregnancy and especially in the last 3 months of the pregnancy.  You think you are having an allergic reaction or side effects from the medicine you are taking. Document Released: 03/01/2001 Document Revised: 02/15/2012 Document Reviewed: 11/03/2011 [facility] Patient Information 2013 [place], [place].

## 2013-03-31 NOTE — MAU Note (Signed)
Patient states she had been having contractions every 5 minutes but has spaced out to 10-15 minutes. States she had not felt fetal movement since this am, fetal heart beat in triage was in the 120's. Patient denies bleeding or leaking.

## 2013-04-03 ENCOUNTER — Encounter (HOSPITAL_COMMUNITY): Payer: Self-pay | Admitting: *Deleted

## 2013-04-03 ENCOUNTER — Telehealth (HOSPITAL_COMMUNITY): Payer: Self-pay | Admitting: *Deleted

## 2013-04-03 LAB — CULTURE, BETA STREP (GROUP B ONLY)

## 2013-04-03 NOTE — MAU Provider Note (Signed)
Attestation of Attending Supervision of Advanced Practitioner (CNM/NP)/Fellow : Evaluation and management procedures were performed by the Advanced Practitioner under my supervision and collaboration.  I have reviewed the Advanced Practitioner's note and chart, and I agree with the management and plan.  HARRAWAY-SMITH, Maryanna Stuber 12:28 PM

## 2013-04-03 NOTE — Telephone Encounter (Signed)
Preadmission screen  

## 2013-04-11 ENCOUNTER — Ambulatory Visit (INDEPENDENT_AMBULATORY_CARE_PROVIDER_SITE_OTHER): Payer: Medicaid Other | Admitting: Obstetrics & Gynecology

## 2013-04-11 ENCOUNTER — Other Ambulatory Visit: Payer: Self-pay | Admitting: Obstetrics & Gynecology

## 2013-04-11 ENCOUNTER — Encounter: Payer: Self-pay | Admitting: Obstetrics & Gynecology

## 2013-04-11 ENCOUNTER — Telehealth (HOSPITAL_COMMUNITY): Payer: Self-pay | Admitting: *Deleted

## 2013-04-11 VITALS — BP 127/77 | Wt 212.0 lb

## 2013-04-11 DIAGNOSIS — O0933 Supervision of pregnancy with insufficient antenatal care, third trimester: Secondary | ICD-10-CM

## 2013-04-11 DIAGNOSIS — O0930 Supervision of pregnancy with insufficient antenatal care, unspecified trimester: Secondary | ICD-10-CM | POA: Insufficient documentation

## 2013-04-11 DIAGNOSIS — O480 Post-term pregnancy: Secondary | ICD-10-CM

## 2013-04-11 LAB — POCT URINALYSIS DIP (DEVICE)
Bilirubin Urine: NEGATIVE
Glucose, UA: NEGATIVE mg/dL
Ketones, ur: NEGATIVE mg/dL

## 2013-04-11 NOTE — Patient Instructions (Addendum)
Normal Labor and Delivery Your caregiver must first be sure you are in labor. Signs of labor include:  You may pass what is called "the mucus plug" before labor begins. This is a small amount of blood stained mucus.  Regular uterine contractions.  The time between contractions get closer together.  The discomfort and pain gradually gets more intense.  Pains are mostly located in the back.  Pains get worse when walking.  The cervix (the opening of the uterus becomes thinner (begins to efface) and opens up (dilates). Once you are in labor and admitted into the hospital or care center, your caregiver will do the following:  A complete physical examination.  Check your vital signs (blood pressure, pulse, temperature and the fetal heart rate).  Do a vaginal examination (using a sterile glove and lubricant) to determine:  The position (presentation) of the baby (head [vertex] or buttock first).  The level (station) of the baby's head in the birth canal.  The effacement and dilatation of the cervix.  You may have your pubic hair shaved and be given an enema depending on your caregiver and the circumstance.  An electronic monitor is usually placed on your abdomen. The monitor follows the length and intensity of the contractions, as well as the baby's heart rate.  Usually, your caregiver will insert an IV in your arm with a bottle of sugar water. This is done as a precaution so that medications can be given to you quickly during labor or delivery. NORMAL LABOR AND DELIVERY IS DIVIDED UP INTO 3 STAGES: First Stage This is when regular contractions begin and the cervix begins to efface and dilate. This stage can last from 3 to 15 hours. The end of the first stage is when the cervix is 100% effaced and 10 centimeters dilated. Pain medications may be given by   Injection (morphine, demerol, etc.)  Regional anesthesia (spinal, caudal or epidural, anesthetics given in different locations of  the spine). Paracervical pain medication may be given, which is an injection of and anesthetic on each side of the cervix. A pregnant woman may request to have "Natural Childbirth" which is not to have any medications or anesthesia during her labor and delivery. Second Stage This is when the baby comes down through the birth canal (vagina) and is born. This can take 1 to 4 hours. As the baby's head comes down through the birth canal, you may feel like you are going to have a bowel movement. You will get the urge to bear down and push until the baby is delivered. As the baby's head is being delivered, the caregiver will decide if an episiotomy (a cut in the perineum and vagina area) is needed to prevent tearing of the tissue in this area. The episiotomy is sewn up after the delivery of the baby and placenta. Sometimes a mask with nitrous oxide is given for the mother to breath during the delivery of the baby to help if there is too much pain. The end of Stage 2 is when the baby is fully delivered. Then when the umbilical cord stops pulsating it is clamped and cut. Third Stage The third stage begins after the baby is completely delivered and ends after the placenta (afterbirth) is delivered. This usually takes 5 to 30 minutes. After the placenta is delivered, a medication is given either by intravenous or injection to help contract the uterus and prevent bleeding. The third stage is not painful and pain medication is usually not necessary.  If an episiotomy was done, it is repaired at this time. After the delivery, the mother is watched and monitored closely for 1 to 2 hours to make sure there is no postpartum bleeding (hemorrhage). If there is a lot of bleeding, medication is given to contract the uterus and stop the bleeding. Document Released: 09/01/2008 Document Revised: 02/15/2012 Document Reviewed: 09/01/2008 [facility] Patient Information 2013 [place], [place].

## 2013-04-11 NOTE — Progress Notes (Signed)
Pulse- 101 

## 2013-04-11 NOTE — Progress Notes (Signed)
Late prenatal care, industion 5/10 is scheduled. NST reactive today. Labor precautions given

## 2013-04-11 NOTE — Telephone Encounter (Signed)
Preadmission screen  

## 2013-04-12 ENCOUNTER — Other Ambulatory Visit: Payer: Medicaid Other

## 2013-04-12 ENCOUNTER — Encounter (HOSPITAL_COMMUNITY): Payer: Self-pay | Admitting: *Deleted

## 2013-04-12 ENCOUNTER — Inpatient Hospital Stay (HOSPITAL_COMMUNITY)
Admission: AD | Admit: 2013-04-12 | Discharge: 2013-04-14 | DRG: 774 | Disposition: A | Discharge status: Home / Self Care | Payer: Medicaid Other | Source: Ambulatory Visit | Attending: Obstetrics & Gynecology | Admitting: Obstetrics & Gynecology

## 2013-04-12 DIAGNOSIS — O98519 Other viral diseases complicating pregnancy, unspecified trimester: Principal | ICD-10-CM | POA: Diagnosis present

## 2013-04-12 DIAGNOSIS — O0930 Supervision of pregnancy with insufficient antenatal care, unspecified trimester: Secondary | ICD-10-CM

## 2013-04-12 DIAGNOSIS — A6000 Herpesviral infection of urogenital system, unspecified: Secondary | ICD-10-CM

## 2013-04-12 HISTORY — DX: Trichomoniasis, unspecified: A59.9

## 2013-04-12 LAB — ABO AND RH: Rh Type: POSITIVE

## 2013-04-12 LAB — CBC
HCT: 33.7 % — ABNORMAL LOW (ref 36.0–46.0)
MCHC: 33.2 g/dL (ref 30.0–36.0)
MCV: 83.8 fL (ref 78.0–100.0)
Platelets: 261 10*3/uL (ref 150–400)
RDW: 13.8 % (ref 11.5–15.5)
WBC: 8.2 10*3/uL (ref 4.0–10.5)

## 2013-04-12 LAB — ANTIBODY SCREEN: Antibody Screen: NEGATIVE

## 2013-04-12 LAB — WET PREP, GENITAL: Yeast Wet Prep HPF POC: NONE SEEN

## 2013-04-12 MED ORDER — PRENATAL MULTIVITAMIN CH: 1.0000 | ORAL_TABLET | Freq: Every day | ORAL | Status: DC

## 2013-04-12 MED ORDER — SENNOSIDES-DOCUSATE SODIUM 8.6-50 MG PO TABS: 2.0000 | ORAL_TABLET | Freq: Every day | ORAL | Status: DC

## 2013-04-12 MED ORDER — FENTANYL CITRATE 0.05 MG/ML IJ SOLN
100.0000 ug | Freq: Once | INTRAMUSCULAR | Status: AC
  Administered 2013-04-12: 100 ug via INTRAVENOUS
  Filled 2013-04-12: qty 2

## 2013-04-12 MED ORDER — OXYCODONE-ACETAMINOPHEN 5-325 MG PO TABS: 1.0000 | ORAL_TABLET | ORAL | Status: DC | PRN

## 2013-04-12 MED ORDER — ZOLPIDEM TARTRATE 5 MG PO TABS: 5.0000 mg | ORAL_TABLET | Freq: Every evening | ORAL | Status: DC | PRN

## 2013-04-12 MED ORDER — IBUPROFEN 600 MG PO TABS
600.0000 mg | ORAL_TABLET | Freq: Four times a day (QID) | ORAL | Status: DC
  Filled 2013-04-12 (×4): qty 1

## 2013-04-12 MED ORDER — LACTATED RINGERS IV SOLN
INTRAVENOUS | Status: DC
  Administered 2013-04-12 (×2): via INTRAVENOUS

## 2013-04-12 MED ORDER — CITRIC ACID-SODIUM CITRATE 334-500 MG/5ML PO SOLN: 30.0000 mL | ORAL | Status: DC | PRN

## 2013-04-12 MED ORDER — DIPHENHYDRAMINE HCL 25 MG PO CAPS: 25.0000 mg | ORAL_CAPSULE | Freq: Four times a day (QID) | ORAL | Status: DC | PRN

## 2013-04-12 MED ORDER — ACETAMINOPHEN 325 MG PO TABS: 650.0000 mg | ORAL_TABLET | ORAL | Status: DC | PRN

## 2013-04-12 MED ORDER — LANOLIN HYDROUS EX OINT: TOPICAL_OINTMENT | CUTANEOUS | Status: DC | PRN

## 2013-04-12 MED ORDER — LIDOCAINE HCL (PF) 1 % IJ SOLN
30.0000 mL | INTRAMUSCULAR | Status: DC | PRN
  Filled 2013-04-12 (×2): qty 30

## 2013-04-12 MED ORDER — DIBUCAINE 1 % RE OINT: 1.0000 "application " | TOPICAL_OINTMENT | RECTAL | Status: DC | PRN

## 2013-04-12 MED ORDER — METRONIDAZOLE IVPB CUSTOM
2.0000 g | Freq: Once | INTRAVENOUS | Status: AC
  Administered 2013-04-12: 2 g via INTRAVENOUS
  Filled 2013-04-12: qty 400

## 2013-04-12 MED ORDER — ONDANSETRON HCL 4 MG PO TABS: 4.0000 mg | ORAL_TABLET | ORAL | Status: DC | PRN

## 2013-04-12 MED ORDER — IBUPROFEN 600 MG PO TABS: 600.0000 mg | ORAL_TABLET | Freq: Four times a day (QID) | ORAL | Status: DC | PRN

## 2013-04-12 MED ORDER — TETANUS-DIPHTH-ACELL PERTUSSIS 5-2.5-18.5 LF-MCG/0.5 IM SUSP
0.5000 mL | Freq: Once | INTRAMUSCULAR | Status: AC
  Administered 2013-04-13: 0.5 mL via INTRAMUSCULAR
  Filled 2013-04-12: qty 0.5

## 2013-04-12 MED ORDER — WITCH HAZEL-GLYCERIN EX PADS: 1.0000 "application " | MEDICATED_PAD | CUTANEOUS | Status: DC | PRN

## 2013-04-12 MED ORDER — OXYTOCIN BOLUS FROM INFUSION: 500.0000 mL | INTRAVENOUS | Status: DC

## 2013-04-12 MED ORDER — SIMETHICONE 80 MG PO CHEW: 80.0000 mg | CHEWABLE_TABLET | ORAL | Status: DC | PRN

## 2013-04-12 MED ORDER — ONDANSETRON HCL 4 MG/2ML IJ SOLN: 4.0000 mg | Freq: Four times a day (QID) | INTRAMUSCULAR | Status: DC | PRN

## 2013-04-12 MED ORDER — BENZOCAINE-MENTHOL 20-0.5 % EX AERO: 1.0000 "application " | INHALATION_SPRAY | CUTANEOUS | Status: DC | PRN

## 2013-04-12 MED ORDER — LACTATED RINGERS IV SOLN: 500.0000 mL | INTRAVENOUS | Status: DC | PRN

## 2013-04-12 MED ORDER — ONDANSETRON HCL 4 MG/2ML IJ SOLN: 4.0000 mg | INTRAMUSCULAR | Status: DC | PRN

## 2013-04-12 MED ORDER — OXYTOCIN 40 UNITS IN LACTATED RINGERS INFUSION - SIMPLE MED
62.5000 mL/h | INTRAVENOUS | Status: DC
  Filled 2013-04-12: qty 1000

## 2013-04-12 NOTE — H&P (Signed)
Tanya Moreno is a 22 y.o. female presenting for contractions that started at 2300 last night. Pain has increased in intensity. + clear discharge this morning. Seen in [facility] with limited visits. Did not obtain anatomy ultrasound or 1 hr glucola.  History  OB History    Grav  Para  Term  Preterm  Abortions  TAB  SAB  Ect  Mult  Living    2  1  1        1       Past Medical History   Diagnosis  Date   .  No pertinent past medical history    .  Gonorrhea    .  Chlamydia    .  Genital HSV      Last outbreak Nov 2013   .  Trichomonas     Past Surgical History   Procedure  Laterality  Date   .  No past surgeries      Family History: family history includes Heart disease in her father and Kidney disease in her maternal aunt.  Social History: reports that she has never smoked. She has never used smokeless tobacco. She reports that she does not drink alcohol or use illicit drugs.  Prenatal Transfer Tool   Maternal Diabetes: Unknown (not done)  Genetic Screening: Declined - Too late  Maternal Ultrasounds/Referrals: Not done; late to care  Maternal Substance Abuse: No  Significant Maternal Medications: None  Significant Maternal Lab Results: Lab values include: Group B Strep negative, Other: HSV II, no meds or outbreak  Other Comments: Late to care; no anatomy US completed; 1 hr glucola unavailable; HSV II, no prophylaxis meds (pt did not pick up)  Review of Systems  Gastrointestinal: Positive for abdominal pain (contractions).  Genitourinary: Negative.  Spotting of blood, ? Clear fluid  All other systems reviewed and are negative.   Dilation: 4  Effacement (%): 90;80  Station: -2  Exam by:: S. Carrera, RNC  Blood pressure 135/87, pulse 83, temperature 98.3 F (36.8 C), temperature source Oral, resp. rate 18, height 5\' 3"  (1.6 m), weight 96.163 kg (212 lb), last menstrual period 07/26/2012.  Maternal Exam:  Uterine Assessment: Contraction strength is firm. Contraction frequency  is irregular.  Abdomen: Patient reports no abdominal tenderness. Estimated fetal weight is 7.5.  Fetal presentation: vertex  Introitus: Normal vulva. Vulva is negative for lesion.  Vagina is positive for vaginal discharge (yellowish discharge).  Vagina is negative for ulcerations.  Ferning test: negative.  Nitrazine test: not done.  Pelvis: adequate for delivery.  Fetal Exam  Fetal Monitor Review: Baseline rate: 140's.  Variability: moderate (6-25 bpm).  Pattern: accelerations present.  Fetal State Assessment: Category I - tracings are normal.   Physical Exam  Genitourinary: Vulva exhibits no lesion. Vaginal discharge (yellowish discharge) found.   Prenatal labs:  ABO, Rh: O/POS/-- (05/06 1118)  Antibody: NEG (05/06 1118)  Rubella:  RPR: NON REACTIVE (04/25 1705)  HBsAg: NEGATIVE (04/25 1705)  HIV: NON REACTIVE (04/25 1705)  GBS: Negative (04/28 0000)  Assessment/Plan:  G2P1001 at 40.4 wks with Limited Prenatal Care  HSV II - No prophylaxis; no lesions  GBS Neg  Abnormal Vaginal Discharge - Hx Trichomoniasis  Plan:  Admit to [facility] prep pending  Anticipate NSVD  [facility] 04/12/2013 4:36 PM

## 2013-04-12 NOTE — Progress Notes (Signed)
   Subjective: Pt reports feeling pressure with contractions.  Declines pain medication.  Objective: BP 129/67  Pulse 77  Temp(Src) 98.1 F (36.7 C) (Oral)  Resp 18  Ht 5\' 3"  (1.6 m)  Wt 96.163 kg (212 lb)  BMI 37.56 kg/m2  LMP 07/26/2012      FHT:  FHR: 130's bpm, variability: moderate,  accelerations:  Present,  decelerations:  Absent UC:   irregular, every 5-8 minutes SVE:   Dilation: 7 Effacement (%): 80;90 Station: -2 Exam by:: [employer]: Lab Results  Component Value Date   WBC 8.2 04/12/2013   HGB 11.2* 04/12/2013   HCT 33.7* 04/12/2013   MCV 83.8 04/12/2013   PLT 261 04/12/2013    Assessment / Plan: Spontaneous labor, progressing normally  Labor: Progressing normally Preeclampsia:  n/a Fetal Wellbeing:  Category I Pain Control:  Labor support without medications I/D:  n/a Anticipated MOD:  NSVD  [facility] 04/12/2013, 4:36 PM

## 2013-04-12 NOTE — MAU Note (Addendum)
UC's since last night, stronger this AM. Noted some blood this AM. Examined yesterday, states was 1-2 cm. Late to care. States was started on medication for HSV suppression and another for trich.

## 2013-04-12 NOTE — Progress Notes (Signed)
Tanya Moreno is a 22 y.o. G2P1001 at [redacted]w[redacted]d by ultrasound admitted for active labor  Subjective: Rocking with contractions. Wants IV pain med to help.  Objective: BP 121/86  Pulse 98  Temp(Src) 98.5 F (36.9 C) (Oral)  Resp 18  Ht 5\' 3"  (1.6 m)  Wt 212 lb (96.163 kg)  BMI 37.56 kg/m2  SpO2 99%  LMP 07/26/2012      FHT:  FHR: 145 bpm, variability: moderate,  accelerations:  Present,  decelerations:  Absent UC:   regular, every 1-2 minutes SVE:   Dilation: 8 Effacement (%): 90 Station: -2;-1 Exam by:: A.Davis, RN  Labs: Lab Results  Component Value Date   WBC 8.2 04/12/2013   HGB 11.2* 04/12/2013   HCT 33.7* 04/12/2013   MCV 83.8 04/12/2013   PLT 261 04/12/2013    Assessment / Plan: Spontaneous labor, progressing normally  Labor: Progressing normally Preeclampsia:  n/a Fetal Wellbeing:  Category I Pain Control:  Fentanyl I/D:  n/a Anticipated MOD:  NSVD  Azlin Zilberman 04/12/2013, 8:26 PM

## 2013-04-12 NOTE — MAU Provider Note (Signed)
Tanya Moreno is a 22 y.o. female presenting for contractions that started at 2300 last night.  Pain has increased in intensity.  + clear discharge this morning.  Seen in [facility] with limited visits.  Did not obtain anatomy ultrasound or 1 hr glucola.   History OB History   Grav Para Term Preterm Abortions TAB SAB Ect Mult Living   2 1 1       1      Past Medical History  Diagnosis Date  . No pertinent past medical history   . Gonorrhea   . Chlamydia   . Genital HSV     Last outbreak Nov 2013  . Trichomonas    Past Surgical History  Procedure Laterality Date  . No past surgeries     Family History: family history includes Heart disease in her father and Kidney disease in her maternal aunt. Social History:  reports that she has never smoked. She has never used smokeless tobacco. She reports that she does not drink alcohol or use illicit drugs.   Prenatal Transfer Tool  Maternal Diabetes: Unknown (not done) Genetic Screening: Declined - Too late Maternal Ultrasounds/Referrals: Not done; late to care Maternal Substance Abuse:  No Significant Maternal Medications:  None Significant Maternal Lab Results:  Lab values include: Group B Strep negative, Other: HSV II, no meds or outbreak Other Comments:  Late to care; no anatomy US completed; 1 hr glucola unavailable; HSV II, no prophylaxis meds (pt did not pick up)  Review of Systems  Gastrointestinal: Positive for abdominal pain (contractions).  Genitourinary: Negative.        Spotting of blood, ? Clear fluid  All other systems reviewed and are negative.    Dilation: 4 Effacement (%): 90;80 Station: -2 Exam by:: S. Carrera, RNC Blood pressure 135/87, pulse 83, temperature 98.3 F (36.8 C), temperature source Oral, resp. rate 18, height 5\' 3"  (1.6 m), weight 96.163 kg (212 lb), last menstrual period 07/26/2012. Maternal Exam:  Uterine Assessment: Contraction strength is firm.  Contraction frequency is irregular.   Abdomen:  Patient reports no abdominal tenderness. Estimated fetal weight is 7.5.   Fetal presentation: vertex  Introitus: Normal vulva. Vulva is negative for lesion.  Vagina is positive for vaginal discharge (yellowish discharge).  Vagina is negative for ulcerations.  Ferning test: negative.  Nitrazine test: not done.  Pelvis: adequate for delivery.      Fetal Exam Fetal Monitor Review: Baseline rate: 140's.  Variability: moderate (6-25 bpm).   Pattern: accelerations present.    Fetal State Assessment: Category I - tracings are normal.     Physical Exam  Genitourinary: Vulva exhibits no lesion. Vaginal discharge (yellowish discharge) found.    Prenatal labs: ABO, Rh: O/POS/-- (05/06 1118) Antibody: NEG (05/06 1118) Rubella:   RPR: NON REACTIVE (04/25 1705)  HBsAg: NEGATIVE (04/25 1705)  HIV: NON REACTIVE (04/25 1705)  GBS: Negative (04/28 0000)   Assessment/Plan: G2P1001 at 40.4 wks with Limited Prenatal Care HSV II - No prophylaxis; no lesions  GBS Neg Abnormal Vaginal Discharge - Hx Trichomoniasis  Plan: Admit to [facility] prep pending Anticipate NSVD  [facility] 04/12/2013, 12:03 PM

## 2013-04-13 NOTE — Progress Notes (Signed)
Post Partum Day 1 Subjective: no complaints, up ad lib, voiding, tolerating PO and + flatus  Objective: Blood pressure 126/71, pulse 66, temperature 98.3 F (36.8 C), temperature source Oral, resp. rate 20, height 5\' 3"  (1.6 m), weight 96.163 kg (212 lb), last menstrual period 07/26/2012, SpO2 99.00%, unknown if currently breastfeeding.  Physical Exam:  General: alert, cooperative and no distress Lochia: appropriate Uterine Fundus: firm Incision: n/a DVT Evaluation: No evidence of DVT seen on physical exam. Negative Homan's sign. No cords or calf tenderness. No significant calf/ankle edema.   Recent Labs  04/12/13 1315  HGB 11.2*  HCT 33.7*    Assessment/Plan: Plan for discharge tomorrow and Contraception Depo, bottle feeding   LOS: 1 day   Napoleon Form 04/13/2013, 9:25 AM

## 2013-04-13 NOTE — Progress Notes (Signed)
UR chart review completed.  

## 2013-04-13 NOTE — Clinical Social Work Maternal (Signed)
    Clinical Social Work Department PSYCHOSOCIAL ASSESSMENT - MATERNAL/CHILD 04/13/2013  Patient:  Tanya Moreno, Tanya Moreno  Account Number:  [id number]  Admit Date:  04/12/2013  Marjo Bicker Name:   Tanya Moreno    Clinical Social Worker:  Nobie Putnam, LCSW   Date/Time:  04/13/2013 02:36 PM  Date Referred:  04/13/2013   Referral source  CN     Referred reason  [facility]   Other referral source:    I:  FAMILY / HOME ENVIRONMENT Child's legal guardian:  PARENT  Guardian - Name Guardian - Age Guardian - Address  Tanya Moreno [address]; [place], [place] [address]  Tanya Moreno 19 (same as above)   Other household support members/support persons Name Relationship DOB  Tanya Moreno, Tanya Moreno. SON 11/01/11   Other support:    II  PSYCHOSOCIAL DATA Information Source:  Patient Interview  Event organiser Employment:   Financial resources:  [employer] If Medicaid - County:  [employer] Clinical biochemist  WIC   School / Grade:   Maternity Care Coordinator / Child Services Coordination / Early Interventions:  Cultural issues impacting care:    III  STRENGTHS Strengths  Adequate Resources  Home prepared for Child (including basic supplies)  Supportive family/friends   Strength comment:    IV  RISK FACTORS AND CURRENT PROBLEMS Current Problem:  YES   Risk Factor & Current Problem Patient Issue Family Issue Risk Factor / Current Problem Comment  Other - See comment Y N LPNC @ 29 weeks    V  SOCIAL WORK ASSESSMENT CSW met with pt to assess reason for [facility] @ 39 weeks.  Pt received pregnancy confirmation at 14 weeks.  She applied for Medicaid benefits at that time but had to wait until benefits were approved.  Once she received insurance, she established [facility].  She denies any illegal substance use & verbalized understanding of hospital drug testing policy. UDS collection is pending, as well as meconium results. She has majority of supplies for the infant but express a need for  more clothing.  CSW provided pt with a bundle pack of clothes.  She identified FOB's mother, Tanya Moreno as her primary support person.  Pt appears to be bonding well & appropriate at this time.  CSW will monitor drug screen results & make a referral if needed.      VI SOCIAL WORK PLAN Social Work Plan  No Further Intervention Required / No Barriers to Discharge   Type of pt/family education:   If child protective services report - county:   If child protective services report - date:   Information/referral to community resources comment:   Other social work plan:

## 2013-04-14 MED ORDER — IBUPROFEN 100 MG/5ML PO SUSP
600.0000 mg | Freq: Four times a day (QID) | ORAL | Status: DC
  Administered 2013-04-14: 600 mg via ORAL
  Filled 2013-04-14 (×6): qty 30

## 2013-04-14 MED ORDER — MEDROXYPROGESTERONE ACETATE 150 MG/ML IM SUSP
150.0000 mg | Freq: Once | INTRAMUSCULAR | Status: AC
  Administered 2013-04-14: 150 mg via INTRAMUSCULAR
  Filled 2013-04-14: qty 1

## 2013-04-14 MED ORDER — IBUPROFEN 100 MG/5ML PO SUSP: 600.0000 mg | Freq: Four times a day (QID) | ORAL | Status: DC

## 2013-04-14 NOTE — Discharge Summary (Signed)
Obstetric Discharge Summary  Tanya Moreno is a 22 y.o. [id number] presenting at [redacted]w[redacted]d in active labor.  She had no prenatal care except at MAU. She had a normal SVD with no complications and an unremarkable postpartum course. She is bottle feeding and plans Depo Provera for contraception.   Reason for Admission: onset of labor Prenatal Procedures: none Intrapartum Procedures: spontaneous vaginal delivery Postpartum Procedures: none Complications-Operative and Postpartum: none Hemoglobin  Date Value Range Status  04/12/2013 11.2* 12.0 - 15.0 g/dL Final     HCT  Date Value Range Status  04/12/2013 33.7* 36.0 - 46.0 % Final    Physical Exam:  General: alert, cooperative and no distress Lochia: appropriate Uterine Fundus: firm Incision: n/a DVT Evaluation: No evidence of DVT seen on physical exam. Negative Homan's sign. No cords or calf tenderness. No significant calf/ankle edema.  Discharge Diagnoses: Term Pregnancy-delivered  Discharge Information: Date: 04/14/2013 Activity: pelvic rest Diet: routine Medications: PNV and Ibuprofen Condition: stable Instructions: refer to practice specific booklet Discharge to: home Follow-up Information   Schedule an appointment as soon as possible for a visit with [facility]. (You should receive a call with a 6 week postpartum appointment. If you do not hear anything, call the number above.)    Contact information:   [address] [place] [address] [phone]      Newborn Data: Live born female  Birth Weight: 6 lb 11.4 oz (3045 g) APGAR: 8, 9  Home with mother.  Napoleon Form 04/14/2013, 7:16 AM

## 2013-04-14 NOTE — Discharge Summary (Signed)
Attestation of Attending Supervision of Advanced Practitioner: Evaluation and management procedures were performed by the PA/NP/CNM/OB Fellow under my supervision/collaboration. Chart reviewed and agree with management and plan.  Sofie Schendel V 04/14/2013 12:54 PM

## 2013-04-15 ENCOUNTER — Inpatient Hospital Stay (HOSPITAL_COMMUNITY): Admission: RE | Admit: 2013-04-15 | Payer: Medicaid Other | Source: Ambulatory Visit

## 2013-05-15 ENCOUNTER — Ambulatory Visit: Payer: Medicaid Other | Admitting: Medical

## 2013-08-09 ENCOUNTER — Ambulatory Visit: Payer: Medicaid Other | Admitting: Obstetrics & Gynecology

## 2013-11-29 ENCOUNTER — Inpatient Hospital Stay (HOSPITAL_COMMUNITY)
Admission: AD | Admit: 2013-11-29 | Discharge: 2013-11-29 | Disposition: A | Discharge status: Home / Self Care | Payer: Medicaid Other | Source: Ambulatory Visit | Attending: Obstetrics & Gynecology | Admitting: Obstetrics & Gynecology

## 2013-11-29 ENCOUNTER — Encounter (HOSPITAL_COMMUNITY): Payer: Self-pay | Admitting: *Deleted

## 2013-11-29 DIAGNOSIS — R109 Unspecified abdominal pain: Secondary | ICD-10-CM | POA: Insufficient documentation

## 2013-11-29 DIAGNOSIS — Z3202 Encounter for pregnancy test, result negative: Secondary | ICD-10-CM | POA: Insufficient documentation

## 2013-11-29 LAB — URINALYSIS, ROUTINE W REFLEX MICROSCOPIC
Glucose, UA: NEGATIVE mg/dL
Ketones, ur: NEGATIVE mg/dL
Protein, ur: NEGATIVE mg/dL

## 2013-11-29 LAB — URINE MICROSCOPIC-ADD ON

## 2013-11-29 LAB — WET PREP, GENITAL: Yeast Wet Prep HPF POC: NONE SEEN

## 2013-11-29 NOTE — MAU Provider Note (Signed)
Attestation of Attending Supervision of Advanced Practitioner (CNM/NP): Evaluation and management procedures were performed by the Advanced Practitioner under my supervision and collaboration.  I have reviewed the Advanced Practitioner's note and chart, and I agree with the management and plan.  HARRAWAY-SMITH, Basil Buffin 6:12 PM

## 2013-11-29 NOTE — MAU Provider Note (Signed)
History     CSN: [id number]  Arrival date and time: 11/29/13 1215   First Provider Initiated Contact with Patient 11/29/13 1311      Chief Complaint  Patient presents with  . Possible Pregnancy  . Abdominal Pain   HPI  Ms. Tanya Moreno is a 22 y.o. female G2P2002 at Unknown gestation who presents with abdominal pain and possible pregnancy. She had a positive pregnancy test at home last week and developed abdominal pain 2 weeks ago. The pain is in her lower abdomen; both sides. Our pregnancy test in MAU was negative; Beta hcg pending.    OB History   Grav Para Term Preterm Abortions TAB SAB Ect Mult Living   2 2 2       2       Past Medical History  Diagnosis Date  . No pertinent past medical history   . Gonorrhea   . Chlamydia   . Genital HSV     Last outbreak Nov 2013  . Trichomonas     Past Surgical History  Procedure Laterality Date  . No past surgeries      Family History  Problem Relation Age of Onset  . Heart disease Father   . Kidney disease Maternal Aunt     History  Substance Use Topics  . Smoking status: Never Smoker   . Smokeless tobacco: Never Used  . Alcohol Use: No    Allergies:  Allergies  Allergen Reactions  . Latex Swelling    Prescriptions prior to admission  Medication Sig Dispense Refill  . ibuprofen (ADVIL,MOTRIN) 100 MG/5ML suspension Take 30 mLs (600 mg total) by mouth every 6 (six) hours.  30 mL  1    Results for orders placed during the hospital encounter of 11/29/13 (from the past 24 hour(s))  URINALYSIS, ROUTINE W REFLEX MICROSCOPIC     Status: Abnormal   Collection Time    11/29/13 12:30 PM      Result Value Range   Color, Urine YELLOW  YELLOW   APPearance CLOUDY (*) CLEAR   Specific Gravity, Urine 1.025  1.005 - 1.030   pH 6.0  5.0 - 8.0   Glucose, UA NEGATIVE  NEGATIVE mg/dL   Hgb urine dipstick NEGATIVE  NEGATIVE   Bilirubin Urine NEGATIVE  NEGATIVE   Ketones, ur NEGATIVE  NEGATIVE mg/dL   Protein, ur  NEGATIVE  NEGATIVE mg/dL   Urobilinogen, UA 0.2  0.0 - 1.0 mg/dL   Nitrite NEGATIVE  NEGATIVE   Leukocytes, UA SMALL (*) NEGATIVE  URINE MICROSCOPIC-ADD ON     Status: Abnormal   Collection Time    11/29/13 12:30 PM      Result Value Range   Squamous Epithelial / LPF FEW (*) RARE   WBC, UA 7-10  <3 WBC/hpf   Bacteria, UA FEW (*) RARE  HCG, QUANTITATIVE, PREGNANCY     Status: None   Collection Time    11/29/13 12:41 PM      Result Value Range   hCG, Beta Chain, Quant, S <1  <5 mIU/mL  WET PREP, GENITAL     Status: Abnormal   Collection Time    11/29/13  1:20 PM      Result Value Range   Yeast Wet Prep HPF POC NONE SEEN  NONE SEEN   Trich, Wet Prep NONE SEEN  NONE SEEN   Clue Cells Wet Prep HPF POC NONE SEEN  NONE SEEN   WBC, Wet Prep HPF POC FEW (*) NONE SEEN  Review of Systems  Constitutional: Positive for chills. Negative for fever.  Gastrointestinal: Positive for abdominal pain. Negative for nausea, vomiting, diarrhea and constipation.       Bilateral lower abdominal pain   Genitourinary: Negative for dysuria, urgency, frequency and hematuria.       No vaginal discharge. No vaginal bleeding. No dysuria.   Neurological: Negative for headaches.   Physical Exam   Blood pressure 121/73, pulse 114, temperature 99.5 F (37.5 C), temperature source Oral, resp. rate 18, height 5\' 3"  (1.6 m), weight 94.802 kg (209 lb).  Physical Exam  Constitutional: She is oriented to person, place, and time. She appears well-developed and well-nourished. No distress.  HENT:  Head: Normocephalic.  Eyes: Pupils are equal, round, and reactive to light.  Neck: Neck supple.  GI: Soft. She exhibits no distension. There is tenderness. There is no rebound and no guarding.  +suprapubic tenderness   Genitourinary:  Speculum exam: Vagina - Small amount of creamy discharge, no odor Cervix - No contact bleeding Bimanual exam: Cervix closed Uterus non tender, normal size Adnexa non tender, no  masses bilaterally, + suprapubic tenderness  GC/Chlam, wet prep done Chaperone present for exam.   Neurological: She is alert and oriented to person, place, and time.  Skin: Skin is warm. She is not diaphoretic.    MAU Course  Procedures None  MDM Wet prep  GC/Chlamydia- pending  Pt says she has to leave by 1400. I informed her of wet prep results and encouraged her to go to [facility] ED or [facility] ED if pain did not improve.   Assessment and Plan   A: 1. Encounter for pregnancy test with result negative   2. Abdominal pain of unknown etiology    P: Discharge home in stable condition Ok to take ibuprofen over the counter for pain, as directed on the bottle Safe sex practices discussed    Iona Hansen Jackolyn Geron, NP  11/29/2013, 1:11 PM

## 2013-11-29 NOTE — MAU Note (Signed)
+  HPT a wk ago.  Pain in lower abd for about 2 wks- real bad cramping.

## 2013-11-30 LAB — URINE CULTURE: Colony Count: >=100000

## 2013-11-30 LAB — GC/CHLAMYDIA PROBE AMP
CT Probe RNA: POSITIVE — AB
GC Probe RNA: NEGATIVE

## 2013-12-04 ENCOUNTER — Telehealth: Payer: Self-pay

## 2013-12-04 DIAGNOSIS — A749 Chlamydial infection, unspecified: Secondary | ICD-10-CM

## 2013-12-04 MED ORDER — AZITHROMYCIN 250 MG PO TABS: 1000.0000 mg | ORAL_TABLET | Freq: Once | ORAL | Status: DC

## 2013-12-04 NOTE — Telephone Encounter (Signed)
Message copied by Louanna Raw on Mon Dec 04, 2013  2:20 PM ------      Message from: Pilger, Nellie W      Created: Mon Dec 04, 2013  2:01 PM       Telephone call to patient regarding positive chlamydia culture, patient notified.  Patient has not been treated and would like Rx called in to [facility] on [address].  Instructed patient to notify her partner for treatment.  Report faxed to health department. ------

## 2013-12-04 NOTE — Telephone Encounter (Signed)
Medication ordered and sent to [facility] pharmacy. Called pt. And left message stating we have sent a prescription to her pharmacy and to call if she has any questions or concerns.

## 2014-01-18 ENCOUNTER — Ambulatory Visit: Payer: Self-pay

## 2014-01-25 ENCOUNTER — Ambulatory Visit: Payer: Self-pay | Admitting: Obstetrics & Gynecology

## 2014-02-26 ENCOUNTER — Emergency Department (HOSPITAL_COMMUNITY): Payer: Medicaid Other

## 2014-02-26 ENCOUNTER — Emergency Department (HOSPITAL_COMMUNITY)
Admission: EM | Admit: 2014-02-26 | Discharge: 2014-02-26 | Disposition: A | Discharge status: Home / Self Care | Payer: Medicaid Other | Attending: Emergency Medicine | Admitting: Emergency Medicine

## 2014-02-26 ENCOUNTER — Encounter (HOSPITAL_COMMUNITY): Payer: Self-pay | Admitting: Emergency Medicine

## 2014-02-26 DIAGNOSIS — S93409A Sprain of unspecified ligament of unspecified ankle, initial encounter: Secondary | ICD-10-CM | POA: Insufficient documentation

## 2014-02-26 DIAGNOSIS — Z91040 Latex allergy status: Secondary | ICD-10-CM | POA: Insufficient documentation

## 2014-02-26 DIAGNOSIS — Y9383 Activity, rough housing and horseplay: Secondary | ICD-10-CM | POA: Insufficient documentation

## 2014-02-26 DIAGNOSIS — Y929 Unspecified place or not applicable: Secondary | ICD-10-CM | POA: Insufficient documentation

## 2014-02-26 DIAGNOSIS — S93401A Sprain of unspecified ligament of right ankle, initial encounter: Secondary | ICD-10-CM

## 2014-02-26 DIAGNOSIS — Z8619 Personal history of other infectious and parasitic diseases: Secondary | ICD-10-CM | POA: Insufficient documentation

## 2014-02-26 DIAGNOSIS — X500XXA Overexertion from strenuous movement or load, initial encounter: Secondary | ICD-10-CM | POA: Insufficient documentation

## 2014-02-26 MED ORDER — IBUPROFEN 800 MG PO TABS: 800.0000 mg | ORAL_TABLET | Freq: Three times a day (TID) | ORAL | Status: DC

## 2014-02-26 NOTE — ED Provider Notes (Signed)
CSN: [id number]     Arrival date & time 02/26/14  1139 History  This chart was scribed for non-physician practitioner, Sharilyn SitesLisa Sanders, PA-C, working with Audree CamelScott T Goldston, MD, by Ellin MayhewMichael Levi, ED Scribe. This patient was seen in room TR11C/TR11C and the patient's care was started at 1:18 PM.   The history is provided by the patient. No language interpreter was used.   HPI Comments: Tanya Moreno is a 23 y.o. female who presents to the Emergency Department with a chief complaint of R ankle pain with associated swelling, onset yesterday. Patient states she was wrestling with her boyfriend when she accidentally twisted her ankle. She characterizes her pain as a constant, non-changing ache/soreness, which does not radiate. She further reports the pain is made worse with weight bearing and movement. She denies any numbness or loss of sensation. Patient reports she can move her toes. No prior right ankle injuries or surgeries. She is ambulatory to the ED; however, states that she would like to remain off her foot due to pain.  No intervention tried PTA.  Past Medical History  Diagnosis Date  . No pertinent past medical history   . Gonorrhea   . Chlamydia   . Genital HSV     Last outbreak Nov 2013  . Trichomonas    Past Surgical History  Procedure Laterality Date  . No past surgeries     Family History  Problem Relation Age of Onset  . Heart disease Father   . Kidney disease Maternal Aunt    History  Substance Use Topics  . Smoking status: Never Smoker   . Smokeless tobacco: Never Used  . Alcohol Use: No   OB History   Grav Para Term Preterm Abortions TAB SAB Ect Mult Living   2 2 2       2      Review of Systems  Constitutional: Negative for fever, chills, activity change, appetite change and unexpected weight change.  HENT: Negative for congestion and sore throat.   Respiratory: Negative for cough and shortness of breath.   Cardiovascular: Negative for chest pain.   Gastrointestinal: Negative for nausea, vomiting, abdominal pain and diarrhea.  Musculoskeletal: Positive for arthralgias (R ankle) and joint swelling. Negative for back pain and gait problem.  Skin: Negative for wound.  Neurological: Negative for weakness.  All other systems reviewed and are negative.   Allergies  Latex  Home Medications  No current outpatient prescriptions on file.  Triage Vitals: BP 123/71  Pulse 88  Temp(Src) 98.1 F (36.7 C) (Oral)  Resp 18  Wt 200 lb (90.719 kg)  SpO2 100%  LMP 02/03/2014  Physical Exam  Nursing note and vitals reviewed. Constitutional: She is oriented to person, place, and time. She appears well-developed and well-nourished. No distress.  HENT:  Head: Normocephalic and atraumatic.  Mouth/Throat: Oropharynx is clear and moist.  Eyes: Conjunctivae and EOM are normal. Pupils are equal, round, and reactive to light.  Neck: Normal range of motion. Neck supple.  Cardiovascular: Normal rate, regular rhythm and normal heart sounds.   Pulmonary/Chest: Breath sounds normal. No respiratory distress. She has no wheezes.  Musculoskeletal:       Right ankle: She exhibits swelling. She exhibits normal range of motion, no ecchymosis, no deformity and no laceration. Tenderness. Lateral malleolus tenderness found. Achilles tendon normal.  R ankle swollen and TTP along lateral asp. Strong distal pulsations and capillary refill. Pain with flexion/extension. Moving all toes appropriately. Distal sensation intact.  Neurological: She is  alert and oriented to person, place, and time.  Skin: Skin is warm and dry. She is not diaphoretic.  Psychiatric: She has a normal mood and affect.    ED Course  Procedures (including critical care time)  DIAGNOSTIC STUDIES: Oxygen Saturation is 100% on room air, normal by my interpretation.    COORDINATION OF CARE: 1:23 PM-Discussed negative imaging results and my suspicion of a sprain. Will order brace and crutches  for patient. Recommended patient to rest area and apply ice. Treatment plan discussed with patient and patient agrees.  Imaging Review Dg Ankle Complete Right  02/26/2014   CLINICAL DATA:  Pain post twisted ankle 1 day ago  EXAM: RIGHT ANKLE - COMPLETE 3+ VIEW  COMPARISON:  None.  FINDINGS: Three views of right ankle submitted. No acute fracture or subluxation. Ankle mortise is preserved.  IMPRESSION: Negative.   Electronically Signed   By: Natasha Mead M.D.   On: 02/26/2014 12:55   MDM   Final diagnoses:  Sprain of ankle, right   X-ray negative for acute fracture dislocation, likely sprain. Ankle splinted, crutches given.  Patient to follow with orthopedics if symptoms worsen, or no improvement within one week. Rx Motrin. Recommended RICE routine at home for added relief and swelling control.  Discussed plan with pt, they agreed.  Return precautions advised.  I personally performed the services described in this documentation, which was scribed in my presence. The recorded information has been reviewed and is accurate.   Garlon Hatchet, PA-C 02/26/14 1606

## 2014-02-26 NOTE — ED Notes (Signed)
Twisted right ankle yesterday while wrestling with boyfriend.

## 2014-02-26 NOTE — Progress Notes (Signed)
Orthopedic Tech Progress Note Patient Details:  Lillard AnesCierra S Deland [birth year] [id number] Ankle ASO applied to Right LE. Crutches fit for height and comfort. Ortho Devices Type of Ortho Device: ASO;Crutches Ortho Device/Splint Interventions: Application   Asia R Thompson 02/26/2014, 1:47 PM

## 2014-02-26 NOTE — ED Notes (Signed)
Ortho called and on their way to place ASO and give crutches.

## 2014-02-26 NOTE — Discharge Instructions (Signed)
Take the prescribed medication as directed.  May ice and elevate foot at home for added relief. Use crutches to start and slowly progress to full weight bearing. Follow-up with orthopedics if no improvement in 1 week or if symptoms worsen. Return to the ED for new or worsening symptoms.

## 2014-03-01 NOTE — ED Provider Notes (Signed)
Medical screening examination/treatment/procedure(s) were performed by non-physician practitioner and as supervising physician I was immediately available for consultation/collaboration.   EKG Interpretation None        Tanya Moreno T Zayna Toste, MD 03/01/14 0700 

## 2014-03-07 ENCOUNTER — Other Ambulatory Visit: Payer: Medicaid Other

## 2014-05-09 ENCOUNTER — Encounter (HOSPITAL_COMMUNITY): Payer: Self-pay | Admitting: Emergency Medicine

## 2014-05-09 ENCOUNTER — Institutional Professional Consult (permissible substitution): Payer: Medicaid Other | Admitting: Obstetrics & Gynecology

## 2014-05-09 ENCOUNTER — Emergency Department (HOSPITAL_COMMUNITY)
Admission: EM | Admit: 2014-05-09 | Discharge: 2014-05-09 | Disposition: A | Discharge status: Home / Self Care | Payer: Medicaid Other | Attending: Emergency Medicine | Admitting: Emergency Medicine

## 2014-05-09 DIAGNOSIS — Z791 Long term (current) use of non-steroidal anti-inflammatories (NSAID): Secondary | ICD-10-CM | POA: Insufficient documentation

## 2014-05-09 DIAGNOSIS — Z792 Long term (current) use of antibiotics: Secondary | ICD-10-CM | POA: Insufficient documentation

## 2014-05-09 DIAGNOSIS — N390 Urinary tract infection, site not specified: Secondary | ICD-10-CM | POA: Insufficient documentation

## 2014-05-09 DIAGNOSIS — M549 Dorsalgia, unspecified: Secondary | ICD-10-CM | POA: Insufficient documentation

## 2014-05-09 DIAGNOSIS — R1110 Vomiting, unspecified: Secondary | ICD-10-CM

## 2014-05-09 DIAGNOSIS — Z8619 Personal history of other infectious and parasitic diseases: Secondary | ICD-10-CM | POA: Insufficient documentation

## 2014-05-09 DIAGNOSIS — Z3202 Encounter for pregnancy test, result negative: Secondary | ICD-10-CM | POA: Insufficient documentation

## 2014-05-09 DIAGNOSIS — R112 Nausea with vomiting, unspecified: Secondary | ICD-10-CM | POA: Insufficient documentation

## 2014-05-09 DIAGNOSIS — R197 Diarrhea, unspecified: Secondary | ICD-10-CM | POA: Insufficient documentation

## 2014-05-09 LAB — COMPREHENSIVE METABOLIC PANEL WITH GFR
ALT: 16 U/L (ref 0–35)
AST: 17 U/L (ref 0–37)
Albumin: 3.8 g/dL (ref 3.5–5.2)
Alkaline Phosphatase: 99 U/L (ref 39–117)
BUN: 9 mg/dL (ref 6–23)
CO2: 24 meq/L (ref 19–32)
Calcium: 9.3 mg/dL (ref 8.4–10.5)
Chloride: 103 meq/L (ref 96–112)
Creatinine, Ser: 0.7 mg/dL (ref 0.50–1.10)
GFR calc Af Amer: >90 mL/min
GFR calc non Af Amer: >90 mL/min
Glucose, Bld: 79 mg/dL (ref 70–99)
Potassium: 4.1 meq/L (ref 3.7–5.3)
Sodium: 138 meq/L (ref 137–147)
Total Bilirubin: 0.4 mg/dL (ref 0.3–1.2)
Total Protein: 7.8 g/dL (ref 6.0–8.3)

## 2014-05-09 LAB — CBC WITH DIFFERENTIAL/PLATELET
Basophils Absolute: 0 K/uL (ref 0.0–0.1)
Basophils Relative: 0 % (ref 0–1)
Eosinophils Absolute: 0.1 K/uL (ref 0.0–0.7)
Eosinophils Relative: 1 % (ref 0–5)
HCT: 41.4 % (ref 36.0–46.0)
Hemoglobin: 14.7 g/dL (ref 12.0–15.0)
Lymphocytes Relative: 53 % — ABNORMAL HIGH (ref 12–46)
Lymphs Abs: 3.4 K/uL (ref 0.7–4.0)
MCH: 31.3 pg (ref 26.0–34.0)
MCHC: 35.5 g/dL (ref 30.0–36.0)
MCV: 88.1 fL (ref 78.0–100.0)
Monocytes Absolute: 0.6 K/uL (ref 0.1–1.0)
Monocytes Relative: 9 % (ref 3–12)
Neutro Abs: 2.4 K/uL (ref 1.7–7.7)
Neutrophils Relative %: 37 % — ABNORMAL LOW (ref 43–77)
Platelets: 298 K/uL (ref 150–400)
RBC: 4.7 MIL/uL (ref 3.87–5.11)
RDW: 12.5 % (ref 11.5–15.5)
WBC: 6.4 K/uL (ref 4.0–10.5)

## 2014-05-09 LAB — URINALYSIS, ROUTINE W REFLEX MICROSCOPIC
Bilirubin Urine: NEGATIVE
Glucose, UA: NEGATIVE mg/dL
Hgb urine dipstick: NEGATIVE
KETONES UR: NEGATIVE mg/dL
NITRITE: NEGATIVE
PH: 7 (ref 5.0–8.0)
PROTEIN: NEGATIVE mg/dL
Specific Gravity, Urine: 1.022 (ref 1.005–1.030)
Urobilinogen, UA: 1 mg/dL (ref 0.0–1.0)

## 2014-05-09 LAB — URINE MICROSCOPIC-ADD ON

## 2014-05-09 LAB — LIPASE, BLOOD: Lipase: 28 U/L (ref 11–59)

## 2014-05-09 LAB — POC URINE PREG, ED: PREG TEST UR: NEGATIVE

## 2014-05-09 MED ORDER — ONDANSETRON 4 MG PO TBDP
4.0000 mg | ORAL_TABLET | Freq: Once | ORAL | Status: AC
  Administered 2014-05-09: 4 mg via ORAL
  Filled 2014-05-09: qty 1

## 2014-05-09 MED ORDER — NITROFURANTOIN MONOHYD MACRO 100 MG PO CAPS: 100.0000 mg | ORAL_CAPSULE | Freq: Two times a day (BID) | ORAL | Status: DC

## 2014-05-09 MED ORDER — ONDANSETRON 4 MG PO TBDP: ORAL_TABLET | ORAL | Status: DC

## 2014-05-09 NOTE — ED Notes (Signed)
Pt reports N/V/D since yesterday. Reports lower abdominal pain as well. Denies urinary symptoms/vaginal bleeding. NAD. Denies recent fever or chills.

## 2014-05-09 NOTE — Discharge Instructions (Signed)
Return if unable to keep antibiotics down. Gradually increase fluid and food intake. Take Zofran for nausea. If you were given medicines take as directed.  If you are on coumadin or contraceptives realize their levels and effectiveness is altered by many different medicines.  If you have any reaction (rash, tongues swelling, other) to the medicines stop taking and see a physician.   Please follow up as directed and return to the ER or see a physician for new or worsening symptoms.  Thank you. Filed Vitals:   05/09/14 1520 05/09/14 1702  BP: 121/79 102/62  Pulse: 74   Temp: 98.5 F (36.9 C)   TempSrc: Oral   Resp: 20 21  SpO2: 99% 100%

## 2014-05-09 NOTE — ED Provider Notes (Signed)
CSN: [id number]     Arrival date & time 05/09/14  1516 History   First MD Initiated Contact with Patient 05/09/14 1631     Chief Complaint  Patient presents with  . Emesis  . Diarrhea     (Consider location/radiation/quality/duration/timing/severity/associated sxs/prior Treatment) HPI Comments: 23 year old female with herpes, vaginal delivery history presents with recurrent vomiting and diarrhea, nonbloody for the past 24 hours. No sick contacts or new foods. No vaginal symptoms. Mild central abdominal ache intermittent/cramping. Patient feels well otherwise. Mild back ache but no urinary symptoms. [place]  Patient is a 23 y.o. female presenting with vomiting and diarrhea. The history is provided by the patient.  Emesis Associated symptoms: diarrhea   Associated symptoms: no chills and no headaches   Diarrhea Associated symptoms: vomiting   Associated symptoms: no chills, no fever and no headaches     Past Medical History  Diagnosis Date  . No pertinent past medical history   . Gonorrhea   . Chlamydia   . Genital HSV     Last outbreak Nov 2013  . Trichomonas    Past Surgical History  Procedure Laterality Date  . No past surgeries     Family History  Problem Relation Age of Onset  . Heart disease Father   . Kidney disease Maternal Aunt    History  Substance Use Topics  . Smoking status: Never Smoker   . Smokeless tobacco: Never Used  . Alcohol Use: No   OB History   Grav Para Term Preterm Abortions TAB SAB Ect Mult Living   2 2 2       2      Review of Systems  Constitutional: Positive for appetite change. Negative for fever and chills.  Respiratory: Negative for shortness of breath.   Cardiovascular: Negative for chest pain.  Gastrointestinal: Positive for nausea, vomiting and diarrhea.  Genitourinary: Negative for dysuria and flank pain.  Musculoskeletal: Positive for back pain. Negative for neck pain and neck  stiffness.  Skin: Negative for rash.  Neurological: Negative for light-headedness and headaches.      Allergies  Latex  Home Medications   Prior to Admission medications   Medication Sig Start Date End Date Taking? Authorizing Provider  ibuprofen (ADVIL,MOTRIN) 800 MG tablet Take 1 tablet (800 mg total) by mouth 3 (three) times daily. 02/26/14  Yes Garlon HatchetLisa M Sanders, PA-C  nitrofurantoin, macrocrystal-monohydrate, (MACROBID) 100 MG capsule Take 1 capsule (100 mg total) by mouth 2 (two) times daily. X 7 days 05/09/14   Enid SkeensJoshua M Jaydon Soroka, MD  ondansetron (ZOFRAN ODT) 4 MG disintegrating tablet 4mg  ODT q4 hours prn nausea/vomit 05/09/14   Enid SkeensJoshua M Prabhnoor Ellenberger, MD   BP 102/62  Pulse 74  Temp(Src) 98.5 F (36.9 C) (Oral)  Resp 21  SpO2 100%  LMP 05/01/2014  Breastfeeding? No Physical Exam  Nursing note and vitals reviewed. Constitutional: She is oriented to person, place, and time. She appears well-developed and well-nourished.  HENT:  Head: Normocephalic and atraumatic.   Mild dry membranes  Eyes: Conjunctivae are normal. Right eye exhibits no discharge. Left eye exhibits no discharge.  Neck: Normal range of motion. Neck supple. No tracheal deviation present.  Cardiovascular: Normal rate.   Pulmonary/Chest: Effort normal.  Abdominal: Soft. She exhibits no distension. There is tenderness (mild infraumbilical). There is no guarding.  Musculoskeletal: She exhibits no edema.  Neurological: She is alert and oriented to person, place, and time.  Skin: Skin is warm. No  rash noted.  Psychiatric: She has a normal mood and affect.    ED Course  Procedures (including critical care time) Labs Review Labs Reviewed  CBC WITH DIFFERENTIAL - Abnormal; Notable for the following:    Neutrophils Relative % 37 (*)    Lymphocytes Relative 53 (*)    All other components within normal limits  URINALYSIS, ROUTINE W REFLEX MICROSCOPIC - Abnormal; Notable for the following:    APPearance CLOUDY (*)     Leukocytes, UA LARGE (*)    All other components within normal limits  URINE MICROSCOPIC-ADD ON - Abnormal; Notable for the following:    Squamous Epithelial / LPF MANY (*)    Bacteria, UA MANY (*)    All other components within normal limits  COMPREHENSIVE METABOLIC PANEL  LIPASE, BLOOD  POC URINE PREG, ED    Imaging Review No results found.   EKG Interpretation None      MDM   Final diagnoses:  Vomiting  Diarrhea  UTI (lower urinary tract infection)   Well-appearing female with benign abdominal exam. Clinically gastroenteritis versus urine infection versus other. No indication for imaging at this time plan for antiemetics, oral antibiotics and followup outpatient. Zofran by mouth fluids given in ED.  Medications  ondansetron (ZOFRAN-ODT) disintegrating tablet 4 mg (not administered)   Results and differential diagnosis were discussed with the patient/parent/guardian. Close follow up outpatient was discussed, comfortable with the plan.   Filed Vitals:   05/09/14 1520 05/09/14 1702  BP: 121/79 102/62  Pulse: 74   Temp: 98.5 F (36.9 C)   TempSrc: Oral   Resp: 20 21  SpO2: 99% 100%        Enid Skeens, MD 05/09/14 1724

## 2014-05-21 ENCOUNTER — Encounter (HOSPITAL_COMMUNITY): Payer: Self-pay | Admitting: Emergency Medicine

## 2014-05-21 ENCOUNTER — Emergency Department (HOSPITAL_COMMUNITY): Payer: Medicaid Other

## 2014-05-21 ENCOUNTER — Emergency Department (HOSPITAL_COMMUNITY)
Admission: EM | Admit: 2014-05-21 | Discharge: 2014-05-21 | Disposition: A | Discharge status: Home / Self Care | Payer: Medicaid Other | Attending: Emergency Medicine | Admitting: Emergency Medicine

## 2014-05-21 DIAGNOSIS — Z91040 Latex allergy status: Secondary | ICD-10-CM | POA: Insufficient documentation

## 2014-05-21 DIAGNOSIS — R071 Chest pain on breathing: Secondary | ICD-10-CM | POA: Insufficient documentation

## 2014-05-21 DIAGNOSIS — Z791 Long term (current) use of non-steroidal anti-inflammatories (NSAID): Secondary | ICD-10-CM | POA: Insufficient documentation

## 2014-05-21 DIAGNOSIS — R0789 Other chest pain: Secondary | ICD-10-CM

## 2014-05-21 DIAGNOSIS — Z8619 Personal history of other infectious and parasitic diseases: Secondary | ICD-10-CM | POA: Insufficient documentation

## 2014-05-21 DIAGNOSIS — Z79899 Other long term (current) drug therapy: Secondary | ICD-10-CM | POA: Insufficient documentation

## 2014-05-21 LAB — BASIC METABOLIC PANEL
BUN: 7 mg/dL (ref 6–23)
CO2: 22 meq/L (ref 19–32)
Calcium: 9.4 mg/dL (ref 8.4–10.5)
Chloride: 103 mEq/L (ref 96–112)
Creatinine, Ser: 0.64 mg/dL (ref 0.50–1.10)
GFR calc Af Amer: >90 mL/min (ref >90)
GFR calc non Af Amer: >90 mL/min (ref >90)
Glucose, Bld: 101 mg/dL — ABNORMAL HIGH (ref 70–99)
Potassium: 3.6 mEq/L — ABNORMAL LOW (ref 3.7–5.3)
SODIUM: 138 meq/L (ref 137–147)

## 2014-05-21 LAB — CBC
HCT: 39.8 % (ref 36.0–46.0)
Hemoglobin: 14 g/dL (ref 12.0–15.0)
MCH: 30.7 pg (ref 26.0–34.0)
MCHC: 35.2 g/dL (ref 30.0–36.0)
MCV: 87.3 fL (ref 78.0–100.0)
Platelets: 343 10*3/uL (ref 150–400)
RBC: 4.56 MIL/uL (ref 3.87–5.11)
RDW: 12.3 % (ref 11.5–15.5)
WBC: 4.4 10*3/uL (ref 4.0–10.5)

## 2014-05-21 LAB — I-STAT TROPONIN, ED: TROPONIN I, POC: 0 ng/mL (ref 0.00–0.08)

## 2014-05-21 MED ORDER — NAPROXEN 500 MG PO TABS: 500.0000 mg | ORAL_TABLET | Freq: Two times a day (BID) | ORAL | Status: DC

## 2014-05-21 MED ORDER — CYCLOBENZAPRINE HCL 10 MG PO TABS: 10.0000 mg | ORAL_TABLET | Freq: Two times a day (BID) | ORAL | Status: DC | PRN

## 2014-05-21 NOTE — ED Provider Notes (Signed)
CSN: [id number]     Arrival date & time 05/21/14  1120 History   First MD Initiated Contact with Patient 05/21/14 1447     Chief Complaint  Patient presents with  . Chest Pain     (Consider location/radiation/quality/duration/timing/severity/associated sxs/prior Treatment) HPI .... stabbing left anterior chest pain suggest today with no radiation.  No dyspnea, diaphoresis, nausea. Patient works in a cold environment cutting fruit.  She has no chronic health problems. No cigarette smoking. No family history of cardiac disease. Pain is worse with deep breaths and palpation   Past Medical History  Diagnosis Date  . No pertinent past medical history   . Gonorrhea   . Chlamydia   . Genital HSV     Last outbreak Nov 2013  . Trichomonas    Past Surgical History  Procedure Laterality Date  . No past surgeries     Family History  Problem Relation Age of Onset  . Heart disease Father   . Kidney disease Maternal Aunt    History  Substance Use Topics  . Smoking status: Never Smoker   . Smokeless tobacco: Never Used  . Alcohol Use: No   OB History   Grav Para Term Preterm Abortions TAB SAB Ect Mult Living   2 2 2       2      Review of Systems  All other systems reviewed and are negative.     Allergies  Latex  Home Medications   Prior to Admission medications   Medication Sig Start Date End Date Taking? Authorizing Provider  ibuprofen (ADVIL,MOTRIN) 800 MG tablet Take 1 tablet (800 mg total) by mouth 3 (three) times daily. 02/26/14  Yes Garlon HatchetLisa M Sanders, PA-C  ondansetron (ZOFRAN ODT) 4 MG disintegrating tablet 4mg  ODT q4 hours prn nausea/vomit 05/09/14  Yes Enid SkeensJoshua M Zavitz, MD  cyclobenzaprine (FLEXERIL) 10 MG tablet Take 1 tablet (10 mg total) by mouth 2 (two) times daily as needed for muscle spasms. 05/21/14   Donnetta HutchingBrian Khylee Algeo, MD  naproxen (NAPROSYN) 500 MG tablet Take 1 tablet (500 mg total) by mouth 2 (two) times daily. 05/21/14   Donnetta HutchingBrian Annalysa Mohammad, MD  nitrofurantoin,  macrocrystal-monohydrate, (MACROBID) 100 MG capsule Take 1 capsule (100 mg total) by mouth 2 (two) times daily. X 7 days 05/09/14   Enid SkeensJoshua M Zavitz, MD   BP 125/83  Pulse 67  Temp(Src) 98.3 F (36.8 C) (Oral)  Resp 17  Ht 5\' 4"  (1.626 m)  Wt 210 lb (95.255 kg)  BMI 36.03 kg/m2  SpO2 100%  LMP 05/01/2014 Physical Exam  Nursing note and vitals reviewed. Constitutional: She is oriented to person, place, and time. She appears well-developed and well-nourished.  HENT:  Head: Normocephalic and atraumatic.  Eyes: Conjunctivae and EOM are normal. Pupils are equal, round, and reactive to light.  Neck: Normal range of motion. Neck supple.  Cardiovascular: Normal rate, regular rhythm and normal heart sounds.   Pulmonary/Chest: Effort normal and breath sounds normal.  Tender to palpation left anterior chest wall  Abdominal: Soft. Bowel sounds are normal.  Musculoskeletal: Normal range of motion.  Neurological: She is alert and oriented to person, place, and time.  Skin: Skin is warm and dry.  Psychiatric: She has a normal mood and affect. Her behavior is normal.    ED Course  Procedures (including critical care time) Labs Review Labs Reviewed  BASIC METABOLIC PANEL - Abnormal; Notable for the following:    Potassium 3.6 (*)    Glucose, Bld 101 (*)  All other components within normal limits  CBC  I-STAT TROPOININ, ED    Imaging Review Dg Chest 2 View  05/21/2014   CLINICAL DATA:  Chest pain and dizziness.  EXAM: CHEST  2 VIEW  COMPARISON:  None.  FINDINGS: The heart size and mediastinal contours are within normal limits. Both lungs are clear. The visualized skeletal structures are unremarkable.  IMPRESSION: Normal exam.   Electronically Signed   By: Kennith CenterEric  Mansell M.D.   On: 05/21/2014 12:59     EKG Interpretation   Date/Time:  Monday May 21 2014 11:27:31 EDT Ventricular Rate:  87 PR Interval:  128 QRS Duration: 88 QT Interval:  362 QTC Calculation: 435 R Axis:   50 Text  Interpretation:  Normal sinus rhythm Normal ECG Confirmed by Quran Vasco   MD, Jeanni Allshouse ([id number]) on 05/21/2014 4:20:57 PM      MDM   Final diagnoses:  Chest wall pain    Patient has very low risk for acute coronary syndrome or pulmonary embolism. This is most likely chest wall pain. Discharge medications Naprosyn 500 mg and Flexeril 10 mg    Donnetta HutchingBrian Ky Rumple, MD 05/21/14 1623

## 2014-05-21 NOTE — ED Notes (Signed)
Pt states that she has left chest pain with dizziness. Pt states that pain is worse with breathing. Tenderness with palpation in left upper chest. Pt denies cough. Pain is constant and began while pt was at work standing. Pt denies any injury or heavy lifting.

## 2014-05-21 NOTE — Discharge Instructions (Signed)
Chest Wall Pain Chest wall pain is pain felt in or around the chest bones and muscles. It may take up to 6 weeks to get better. It may take longer if you are active. Chest wall pain can happen on its own. Other times, things like germs, injury, coughing, or exercise can cause the pain. HOME CARE   Avoid activities that make you tired or cause pain. Try not to use your chest, belly (abdominal), or side muscles. Do not use heavy weights.  Put ice on the sore area.  Put ice in a plastic bag.  Place a towel between your skin and the bag.  Leave the ice on for 15-20 minutes for the first 2 days.  Only take medicine as told by your doctor. GET HELP RIGHT AWAY IF:   You have more pain or are very uncomfortable.  You have a fever.  Your chest pain gets worse.  You have new problems.  You feel sick to your stomach (nauseous) or throw up (vomit).  You start to sweat or feel lightheaded.  You have a cough with mucus (phlegm).  You cough up blood. MAKE SURE YOU:   Understand these instructions.  Will watch your condition.  Will get help right away if you are not doing well or get worse. Document Released: 05/11/2008 Document Revised: 02/15/2012 Document Reviewed: 07/20/2011 [facility] Patient Information 2014 [employer] [place].  Medication for pain and muscle relaxation. Tests show no evidence of a heart attack.

## 2014-05-26 ENCOUNTER — Emergency Department (HOSPITAL_COMMUNITY)
Admission: EM | Admit: 2014-05-26 | Discharge: 2014-05-26 | Disposition: A | Discharge status: Home / Self Care | Payer: Medicaid Other | Attending: Emergency Medicine | Admitting: Emergency Medicine

## 2014-05-26 ENCOUNTER — Encounter (HOSPITAL_COMMUNITY): Payer: Self-pay | Admitting: Emergency Medicine

## 2014-05-26 DIAGNOSIS — Z3202 Encounter for pregnancy test, result negative: Secondary | ICD-10-CM | POA: Insufficient documentation

## 2014-05-26 DIAGNOSIS — R197 Diarrhea, unspecified: Secondary | ICD-10-CM | POA: Insufficient documentation

## 2014-05-26 DIAGNOSIS — Z87440 Personal history of urinary (tract) infections: Secondary | ICD-10-CM | POA: Insufficient documentation

## 2014-05-26 DIAGNOSIS — Z8619 Personal history of other infectious and parasitic diseases: Secondary | ICD-10-CM | POA: Insufficient documentation

## 2014-05-26 DIAGNOSIS — R5383 Other fatigue: Secondary | ICD-10-CM

## 2014-05-26 DIAGNOSIS — R5381 Other malaise: Secondary | ICD-10-CM | POA: Insufficient documentation

## 2014-05-26 DIAGNOSIS — R112 Nausea with vomiting, unspecified: Secondary | ICD-10-CM | POA: Insufficient documentation

## 2014-05-26 DIAGNOSIS — E876 Hypokalemia: Secondary | ICD-10-CM | POA: Insufficient documentation

## 2014-05-26 DIAGNOSIS — M2550 Pain in unspecified joint: Secondary | ICD-10-CM

## 2014-05-26 DIAGNOSIS — R630 Anorexia: Secondary | ICD-10-CM | POA: Insufficient documentation

## 2014-05-26 DIAGNOSIS — Z91040 Latex allergy status: Secondary | ICD-10-CM | POA: Insufficient documentation

## 2014-05-26 DIAGNOSIS — M2559 Pain in other specified joint: Secondary | ICD-10-CM | POA: Insufficient documentation

## 2014-05-26 LAB — COMPREHENSIVE METABOLIC PANEL
ALBUMIN: 4.9 g/dL (ref 3.5–5.2)
ALT: 20 U/L (ref 0–35)
AST: 25 U/L (ref 0–37)
Alkaline Phosphatase: 125 U/L — ABNORMAL HIGH (ref 39–117)
BUN: 11 mg/dL (ref 6–23)
CALCIUM: 10.5 mg/dL (ref 8.4–10.5)
CO2: 22 mEq/L (ref 19–32)
Chloride: 101 mEq/L (ref 96–112)
Creatinine, Ser: 0.84 mg/dL (ref 0.50–1.10)
GFR calc Af Amer: >90 mL/min (ref >90)
GFR calc non Af Amer: >90 mL/min (ref >90)
Glucose, Bld: 129 mg/dL — ABNORMAL HIGH (ref 70–99)
Potassium: 3.1 mEq/L — ABNORMAL LOW (ref 3.7–5.3)
SODIUM: 141 meq/L (ref 137–147)
Total Bilirubin: 0.2 mg/dL — ABNORMAL LOW (ref 0.3–1.2)
Total Protein: 9.5 g/dL — ABNORMAL HIGH (ref 6.0–8.3)

## 2014-05-26 LAB — URINALYSIS, ROUTINE W REFLEX MICROSCOPIC
Bilirubin Urine: NEGATIVE
Glucose, UA: 100 mg/dL — AB
HGB URINE DIPSTICK: NEGATIVE
KETONES UR: NEGATIVE mg/dL
Leukocytes, UA: NEGATIVE
Nitrite: NEGATIVE
PROTEIN: 30 mg/dL — AB
Specific Gravity, Urine: >1.03 — ABNORMAL HIGH (ref 1.005–1.030)
UROBILINOGEN UA: 0.2 mg/dL (ref 0.0–1.0)
pH: 5.5 (ref 5.0–8.0)

## 2014-05-26 LAB — CBC WITH DIFFERENTIAL/PLATELET
BASOS ABS: 0 10*3/uL (ref 0.0–0.1)
BASOS PCT: 0 % (ref 0–1)
EOS ABS: 0 10*3/uL (ref 0.0–0.7)
EOS PCT: 0 % (ref 0–5)
HCT: 45.6 % (ref 36.0–46.0)
Hemoglobin: 16 g/dL — ABNORMAL HIGH (ref 12.0–15.0)
Lymphocytes Relative: 21 % (ref 12–46)
Lymphs Abs: 2.1 10*3/uL (ref 0.7–4.0)
MCH: 31.1 pg (ref 26.0–34.0)
MCHC: 35.1 g/dL (ref 30.0–36.0)
MCV: 88.5 fL (ref 78.0–100.0)
Monocytes Absolute: 0.8 10*3/uL (ref 0.1–1.0)
Monocytes Relative: 8 % (ref 3–12)
Neutro Abs: 7.2 10*3/uL (ref 1.7–7.7)
Neutrophils Relative %: 71 % (ref 43–77)
PLATELETS: 359 10*3/uL (ref 150–400)
RBC: 5.15 MIL/uL — ABNORMAL HIGH (ref 3.87–5.11)
RDW: 12.3 % (ref 11.5–15.5)
WBC: 10.1 10*3/uL (ref 4.0–10.5)

## 2014-05-26 LAB — RAPID URINE DRUG SCREEN, HOSP PERFORMED
Amphetamines: NOT DETECTED
Barbiturates: NOT DETECTED
Benzodiazepines: NOT DETECTED
COCAINE: NOT DETECTED
OPIATES: NOT DETECTED
TETRAHYDROCANNABINOL: NOT DETECTED

## 2014-05-26 LAB — URINE MICROSCOPIC-ADD ON

## 2014-05-26 LAB — HCG, QUANTITATIVE, PREGNANCY: hCG, Beta Chain, Quant, S: 1 m[IU]/mL (ref <5)

## 2014-05-26 LAB — PREGNANCY, URINE: Preg Test, Ur: NEGATIVE

## 2014-05-26 LAB — LIPASE, BLOOD: Lipase: 30 U/L (ref 11–59)

## 2014-05-26 MED ORDER — POTASSIUM CHLORIDE CRYS ER 20 MEQ PO TBCR
40.0000 meq | EXTENDED_RELEASE_TABLET | Freq: Once | ORAL | Status: AC
  Administered 2014-05-26: 40 meq via ORAL
  Filled 2014-05-26: qty 2

## 2014-05-26 MED ORDER — SODIUM CHLORIDE 0.9 % IV BOLUS (SEPSIS)
1000.0000 mL | Freq: Once | INTRAVENOUS | Status: AC
  Administered 2014-05-26: 1000 mL via INTRAVENOUS

## 2014-05-26 MED ORDER — ONDANSETRON 4 MG PO TBDP
8.0000 mg | ORAL_TABLET | ORAL | Status: AC
  Administered 2014-05-26: 8 mg via ORAL
  Filled 2014-05-26: qty 2

## 2014-05-26 MED ORDER — ONDANSETRON HCL 4 MG PO TABS: 4.0000 mg | ORAL_TABLET | Freq: Four times a day (QID) | ORAL | Status: DC

## 2014-05-26 MED ORDER — ONDANSETRON HCL 4 MG/2ML IJ SOLN
4.0000 mg | Freq: Once | INTRAMUSCULAR | Status: DC
  Filled 2014-05-26: qty 2

## 2014-05-26 NOTE — ED Notes (Signed)
IV nurse at the bedside starting PIV.

## 2014-05-26 NOTE — ED Notes (Signed)
Asked pt to urinate, said she "didnt want to pee in a cup", RN then asked her to try, I then placed pt in wheelchair and took her to the bathroom, sent her in bathroom with cup. Pt used the bathroom but refused to give urine in cup and said "I told you I didn't want to pee in a cup" as she handed the cup back to me.

## 2014-05-26 NOTE — Discharge Instructions (Signed)
Viral Gastroenteritis Follow up with your doctor. Return to the ED if you develop persistent vomiting, diarrhea, pain, are unable to keep anything down or have any other concerns. Viral gastroenteritis is also known as stomach flu. This condition affects the stomach and intestinal tract. It can cause sudden diarrhea and vomiting. The illness typically lasts 3 to 8 days. Most people develop an immune response that eventually gets rid of the virus. While this natural response develops, the virus can make you quite ill. CAUSES  Many different viruses can cause gastroenteritis, such as rotavirus or noroviruses. You can catch one of these viruses by consuming contaminated food or water. You may also catch a virus by sharing utensils or other personal items with an infected person or by touching a contaminated surface. SYMPTOMS  The most common symptoms are diarrhea and vomiting. These problems can cause a severe loss of body fluids (dehydration) and a body salt (electrolyte) imbalance. Other symptoms may include:  Fever.  Headache.  Fatigue.  Abdominal pain. DIAGNOSIS  Your caregiver can usually diagnose viral gastroenteritis based on your symptoms and a physical exam. A stool sample may also be taken to test for the presence of viruses or other infections. TREATMENT  This illness typically goes away on its own. Treatments are aimed at rehydration. The most serious cases of viral gastroenteritis involve vomiting so severely that you are not able to keep fluids down. In these cases, fluids must be given through an intravenous line (IV). HOME CARE INSTRUCTIONS   Drink enough fluids to keep your urine clear or pale yellow. Drink small amounts of fluids frequently and increase the amounts as tolerated.  Ask your caregiver for specific rehydration instructions.  Avoid:  Foods high in sugar.  Alcohol.  Carbonated drinks.  Tobacco.  Juice.  Caffeine drinks.  Extremely hot or cold  fluids.  Fatty, greasy foods.  Too much intake of anything at one time.  Dairy products until 24 to 48 hours after diarrhea stops.  You may consume probiotics. Probiotics are active cultures of beneficial bacteria. They may lessen the amount and number of diarrheal stools in adults. Probiotics can be found in yogurt with active cultures and in supplements.  Wash your hands well to avoid spreading the virus.  Only take over-the-counter or prescription medicines for pain, discomfort, or fever as directed by your caregiver. Do not give aspirin to children. Antidiarrheal medicines are not recommended.  Ask your caregiver if you should continue to take your regular prescribed and over-the-counter medicines.  Keep all follow-up appointments as directed by your caregiver. SEEK IMMEDIATE MEDICAL CARE IF:   You are unable to keep fluids down.  You do not urinate at least once every 6 to 8 hours.  You develop shortness of breath.  You notice blood in your stool or vomit. This may look like coffee grounds.  You have abdominal pain that increases or is concentrated in one small area (localized).  You have persistent vomiting or diarrhea.  You have a fever.  The patient is a child younger than 3 months, and he or she has a fever.  The patient is a child older than 3 months, and he or she has a fever and persistent symptoms.  The patient is a child older than 3 months, and he or she has a fever and symptoms suddenly get worse.  The patient is a baby, and he or she has no tears when crying. MAKE SURE YOU:   Understand these instructions.  Will  watch your condition.  Will get help right away if you are not doing well or get worse. Document Released: 11/23/2005 Document Revised: 02/15/2012 Document Reviewed: 09/09/2011 [facility] Patient Information 2015 [place], [place]. This information is not intended to replace advice given to you by your health care provider. Make sure you discuss  any questions you have with your health care provider.

## 2014-05-26 NOTE — ED Provider Notes (Signed)
CSN: [id number]     Arrival date & time 05/26/14  1517 History   First MD Initiated Contact with Patient 05/26/14 1524     Chief Complaint  Patient presents with  . Abdominal Pain     (Consider location/radiation/quality/duration/timing/severity/associated sxs/prior Treatment) HPI Comments: Patient with 2 hour history of diffuse abdominal pain, nausea, vomiting or diarrhea. She reported that she's vomited greater than 10 times in the past hour as well as 10 episodes of diarrhea. This contradicts nursing note where she told the nurses she only vomited one time. She says this started after eating some spaghetti. Denies any fever denies any sick contacts denies any recent travel. She was prescribed antibiotics on June 3 for UTI but did not fill them. She denies any dysuria hematuria. No vaginal bleeding or discharge. She denies any blood in her emesis or blood in stool.  The history is provided by the patient.    Past Medical History  Diagnosis Date  . No pertinent past medical history   . Gonorrhea   . Chlamydia   . Genital HSV     Last outbreak Nov 2013  . Trichomonas    Past Surgical History  Procedure Laterality Date  . No past surgeries     Family History  Problem Relation Age of Onset  . Heart disease Father   . Kidney disease Maternal Aunt    History  Substance Use Topics  . Smoking status: Never Smoker   . Smokeless tobacco: Never Used  . Alcohol Use: No   OB History   Grav Para Term Preterm Abortions TAB SAB Ect Mult Living   2 2 2       2      Review of Systems  Constitutional: Positive for activity change, appetite change and fatigue. Negative for fever.  HENT: Negative for congestion and rhinorrhea.   Respiratory: Negative for cough, chest tightness and shortness of breath.   Cardiovascular: Negative for chest pain.  Gastrointestinal: Positive for nausea, vomiting, abdominal pain and diarrhea.  Genitourinary: Negative for dysuria, hematuria, vaginal bleeding  and vaginal discharge.  Musculoskeletal: Positive for arthralgias and myalgias. Negative for neck pain and neck stiffness.  Skin: Negative for rash.  Neurological: Positive for weakness. Negative for dizziness.  A complete 10 system review of systems was obtained and all systems are negative except as noted in the HPI and PMH.      Allergies  Latex  Home Medications   Prior to Admission medications   Medication Sig Start Date End Date Taking? Authorizing Provider  ondansetron (ZOFRAN) 4 MG tablet Take 1 tablet (4 mg total) by mouth every 6 (six) hours. 05/26/14   Glynn OctaveStephen Rancour, MD   BP 103/63  Pulse 89  Temp(Src) 97.5 F (36.4 C) (Oral)  Resp 23  SpO2 100%  LMP 05/01/2014 Physical Exam  Nursing note and vitals reviewed. Constitutional: She is oriented to person, place, and time. She appears well-developed and well-nourished. No distress.  Dry mucous membranes Skin cool to touch, patient not cooperative with exam  HENT:  Head: Normocephalic and atraumatic.  Mouth/Throat: Oropharynx is clear and moist. No oropharyngeal exudate.  Eyes: Conjunctivae and EOM are normal. Pupils are equal, round, and reactive to light.  Neck: Normal range of motion. Neck supple.  No meningismus.  Cardiovascular: Normal rate, regular rhythm, normal heart sounds and intact distal pulses.   No murmur heard. Pulmonary/Chest: Effort normal and breath sounds normal. No respiratory distress.  Abdominal: Soft. There is no tenderness. There is  no rebound and no guarding.  No peritoneal signs  Musculoskeletal: Normal range of motion. She exhibits no edema and no tenderness.  Neurological: She is alert and oriented to person, place, and time. No cranial nerve deficit. She exhibits normal muscle tone. Coordination normal.  No ataxia on finger to nose bilaterally. No pronator drift. 5/5 strength throughout. CN 2-12 intact. Negative Romberg. Equal grip strength. Sensation intact. Gait is normal.   Skin: Skin  is warm.  Psychiatric: She has a normal mood and affect. Her behavior is normal.    ED Course  Procedures (including critical care time) Labs Review Labs Reviewed  URINALYSIS, ROUTINE W REFLEX MICROSCOPIC - Abnormal; Notable for the following:    APPearance HAZY (*)    Specific Gravity, Urine >1.030 (*)    Glucose, UA 100 (*)    Protein, ur 30 (*)    All other components within normal limits  CBC WITH DIFFERENTIAL - Abnormal; Notable for the following:    RBC 5.15 (*)    Hemoglobin 16.0 (*)    All other components within normal limits  COMPREHENSIVE METABOLIC PANEL - Abnormal; Notable for the following:    Potassium 3.1 (*)    Glucose, Bld 129 (*)    Total Protein 9.5 (*)    Alkaline Phosphatase 125 (*)    Total Bilirubin 0.2 (*)    All other components within normal limits  URINE MICROSCOPIC-ADD ON - Abnormal; Notable for the following:    Squamous Epithelial / LPF MANY (*)    Bacteria, UA FEW (*)    All other components within normal limits  PREGNANCY, URINE  LIPASE, BLOOD  HCG, QUANTITATIVE, PREGNANCY  URINE RAPID DRUG SCREEN (HOSP PERFORMED)    Imaging Review No results found.   EKG Interpretation None      MDM   Final diagnoses:  Nausea vomiting and diarrhea   2 hour history of nausea, vomiting, diarrhea or abdominal pain. Vital stable. No distress.  Patient appears to be under the influence of some substance but she denies this. She refuses to provide a urine sample.   Labs remarkable for hypokalemia of 3.1 which is replaced. Patient feels improved after IV fluids and antiemetics. Abdomen soft and nontender.  UA appears contaminated. Patient has had no further vomiting in the ED. She is tolerating by mouth. Her abdomen is soft and nontender. She states she is feeling better. Pregnancy test is negative.  Patient with likely food poisoning versus viral gastroenteritis. She's had both IV and by mouth fluids in the ED and is tolerating by mouth. She'll be  discharged on antibiotics. Followup with PCP. Return precautions discussed.  BP 103/63  Pulse 89  Temp(Src) 97.5 F (36.4 C) (Oral)  Resp 23  SpO2 100%  LMP 05/01/2014   Glynn OctaveStephen Rancour, MD 05/26/14 [phone]

## 2014-05-26 NOTE — ED Notes (Signed)
Pt. Reports approximately 1 hour ago she began to vomit and have abdominal pain.  Pt. Ate spaghetti today.  Vomited X1

## 2014-05-26 NOTE — ED Notes (Signed)
Pt actively vomiting at this time.

## 2014-05-28 ENCOUNTER — Institutional Professional Consult (permissible substitution): Payer: Medicaid Other | Admitting: Obstetrics & Gynecology

## 2014-07-24 ENCOUNTER — Encounter (HOSPITAL_COMMUNITY): Payer: Self-pay | Admitting: Emergency Medicine

## 2014-07-24 ENCOUNTER — Emergency Department (HOSPITAL_COMMUNITY)
Admission: EM | Admit: 2014-07-24 | Discharge: 2014-07-24 | Disposition: A | Discharge status: Home / Self Care | Payer: Medicaid Other | Attending: Emergency Medicine | Admitting: Emergency Medicine

## 2014-07-24 DIAGNOSIS — Z8619 Personal history of other infectious and parasitic diseases: Secondary | ICD-10-CM | POA: Insufficient documentation

## 2014-07-24 DIAGNOSIS — M549 Dorsalgia, unspecified: Secondary | ICD-10-CM | POA: Insufficient documentation

## 2014-07-24 DIAGNOSIS — M5489 Other dorsalgia: Secondary | ICD-10-CM

## 2014-07-24 DIAGNOSIS — Z3202 Encounter for pregnancy test, result negative: Secondary | ICD-10-CM | POA: Diagnosis not present

## 2014-07-24 LAB — URINALYSIS, ROUTINE W REFLEX MICROSCOPIC
Bilirubin Urine: NEGATIVE
GLUCOSE, UA: NEGATIVE mg/dL
Hgb urine dipstick: NEGATIVE
Ketones, ur: NEGATIVE mg/dL
NITRITE: NEGATIVE
Protein, ur: NEGATIVE mg/dL
Specific Gravity, Urine: 1.019 (ref 1.005–1.030)
Urobilinogen, UA: 0.2 mg/dL (ref 0.0–1.0)
pH: 7 (ref 5.0–8.0)

## 2014-07-24 LAB — URINE MICROSCOPIC-ADD ON

## 2014-07-24 LAB — POC URINE PREG, ED: PREG TEST UR: NEGATIVE

## 2014-07-24 MED ORDER — TRAMADOL HCL 50 MG PO TABS: 50.0000 mg | ORAL_TABLET | Freq: Four times a day (QID) | ORAL | Status: DC | PRN

## 2014-07-24 MED ORDER — IBUPROFEN 800 MG PO TABS: 800.0000 mg | ORAL_TABLET | Freq: Three times a day (TID) | ORAL | Status: AC

## 2014-07-24 MED ORDER — IBUPROFEN 800 MG PO TABS
800.0000 mg | ORAL_TABLET | Freq: Once | ORAL | Status: AC
  Administered 2014-07-24: 800 mg via ORAL
  Filled 2014-07-24: qty 1

## 2014-07-24 MED ORDER — DIAZEPAM 5 MG PO TABS: 5.0000 mg | ORAL_TABLET | Freq: Two times a day (BID) | ORAL | Status: DC

## 2014-07-24 NOTE — ED Notes (Signed)
Pt is alert and oriented at discharge.  Pt offered a wheelchair but denied with steady ambulation to the waiting room by this RN.  Pt verbalized understanding of discharge instructions and the need for follow up care.

## 2014-07-24 NOTE — Discharge Instructions (Signed)
As discussed, your evaluation is largely reassuring.  With her ongoing pain it is important to obtain plenty of rest, take all medication as directed, and follow up with orthopedists for assistance with obtaining physical therapy.   Back Pain, Adult Back pain is very common. The pain often gets better over time. The cause of back pain is usually not dangerous. Most people can learn to manage their back pain on their own.  HOME CARE   Stay active. Start with short walks on flat ground if you can. Try to walk farther each day.  Do not sit, drive, or stand in one place for more than 30 minutes. Do not stay in bed.  Do not avoid exercise or work. Activity can help your back heal faster.  Be careful when you bend or lift an object. Bend at your knees, keep the object close to you, and do not twist.  Sleep on a firm mattress. Lie on your side, and bend your knees. If you lie on your back, put a pillow under your knees.  Only take medicines as told by your doctor.  Put ice on the injured area.  Put ice in a plastic bag.  Place a towel between your skin and the bag.  Leave the ice on for 15-20 minutes, 03-04 times a day for the first 2 to 3 days. After that, you can switch between ice and heat packs.  Ask your doctor about back exercises or massage.  Avoid feeling anxious or stressed. Find good ways to deal with stress, such as exercise. GET HELP RIGHT AWAY IF:   Your pain does not go away with rest or medicine.  Your pain does not go away in 1 week.  You have new problems.  You do not feel well.  The pain spreads into your legs.  You cannot control when you poop (bowel movement) or pee (urinate).  Your arms or legs feel weak or lose feeling (numbness).  You feel sick to your stomach (nauseous) or throw up (vomit).  You have belly (abdominal) pain.  You feel like you may pass out (faint). MAKE SURE YOU:   Understand these instructions.  Will watch your  condition.  Will get help right away if you are not doing well or get worse. Document Released: 05/11/2008 Document Revised: 02/15/2012 Document Reviewed: 03/27/2014 [facility] Patient Information 2015 [place]. This information is not intended to replace advice given to you by your health care provider. Make sure you discuss any questions you have with your health care provider.

## 2014-07-24 NOTE — ED Provider Notes (Signed)
CSN: [id number]     Arrival date & time 07/24/14  4098 History   First MD Initiated Contact with Patient 07/24/14 1057     Chief Complaint  Patient presents with  . Back Pain     (Consider location/radiation/quality/duration/timing/severity/associated sxs/prior Treatment) HPI Patient presents with back pain. Onset was 2 weeks ago, while the patient was working. Patient performs manual labor at work. Since onset the pain has been present in the back, initially in the lower, now in the upper back. Pain is sore, severe, minimally improved with OTC medication. There is no associated incontinence, fever, chills, chest pain, dyspnea or other focal complaints.  Past Medical History  Diagnosis Date  . No pertinent past medical history   . Gonorrhea   . Chlamydia   . Genital HSV     Last outbreak Nov 2013  . Trichomonas    Past Surgical History  Procedure Laterality Date  . No past surgeries     Family History  Problem Relation Age of Onset  . Heart disease Father   . Kidney disease Maternal Aunt    History  Substance Use Topics  . Smoking status: Never Smoker   . Smokeless tobacco: Never Used  . Alcohol Use: No   OB History   Grav Para Term Preterm Abortions TAB SAB Ect Mult Living   2 2 2       2      Review of Systems  Constitutional:       Per HPI, otherwise negative  HENT:       Per HPI, otherwise negative  Respiratory:       Per HPI, otherwise negative  Cardiovascular:       Per HPI, otherwise negative  Gastrointestinal: Negative for vomiting.  Endocrine:       Negative aside from HPI  Genitourinary:       Neg aside from HPI   Musculoskeletal:       Per HPI, otherwise negative  Skin: Negative.   Neurological: Negative for syncope.      Allergies  Latex  Home Medications   Prior to Admission medications   Not on File   BP 130/76  Pulse 57  Temp(Src) 98.5 F (36.9 C) (Oral)  Resp 17  Ht 5\' 4"  (1.626 m)  Wt 221 lb (100.245 kg)  BMI 37.92  kg/m2  SpO2 99% Physical Exam  Nursing note and vitals reviewed. Constitutional: She is oriented to person, place, and time. She appears well-developed and well-nourished. No distress.  HENT:  Head: Normocephalic and atraumatic.  Eyes: Conjunctivae and EOM are normal.  Cardiovascular: Normal rate and regular rhythm.   Pulmonary/Chest: Effort normal and breath sounds normal. No stridor. No respiratory distress.  Abdominal: She exhibits no distension.  Musculoskeletal: She exhibits no edema.  Tenderness along the upper and lower paraspinal area, with no focal deformity, no disfigurement, no lateral back pain on palpation. Patient has no other notable musculoskeletal findings  Neurological: She is alert and oriented to person, place, and time. She displays no atrophy and no tremor. No cranial nerve deficit or sensory deficit. She exhibits normal muscle tone. She displays no seizure activity. Coordination normal.  Skin: Skin is warm and dry.  Psychiatric: She has a normal mood and affect.    ED Course  Procedures (including critical care time) Labs Review Labs Reviewed  URINALYSIS, ROUTINE W REFLEX MICROSCOPIC  POC URINE PREG, ED    MDM  This generally well-appearing female presents with 2 weeks of ongoing  back pain.  She is neurologically intact, hemodynamically stable, with no evidence of neurologic compromise, or other ongoing infection or acute pathology. The patient was started on a course of muscle relaxants, analgesics, discharged to followup with orthopedics as needed.     Gerhard Munchobert Keoki Mchargue, MD 07/24/14 1332

## 2014-07-24 NOTE — ED Notes (Signed)
Pt reports back pain, onset 2 weeks ago,  No relief with otc meds, and denies inj.

## 2014-08-27 ENCOUNTER — Ambulatory Visit (INDEPENDENT_AMBULATORY_CARE_PROVIDER_SITE_OTHER): Payer: Medicaid Other | Admitting: Obstetrics & Gynecology

## 2014-08-27 DIAGNOSIS — Z309 Encounter for contraceptive management, unspecified: Secondary | ICD-10-CM

## 2014-08-29 ENCOUNTER — Encounter: Payer: Self-pay | Admitting: Obstetrics & Gynecology

## 2014-08-29 NOTE — Progress Notes (Signed)
No show

## 2014-09-19 ENCOUNTER — Ambulatory Visit: Payer: Medicaid Other | Admitting: Obstetrics & Gynecology

## 2014-10-05 ENCOUNTER — Encounter: Payer: Self-pay | Admitting: Obstetrics & Gynecology

## 2014-10-05 ENCOUNTER — Ambulatory Visit (INDEPENDENT_AMBULATORY_CARE_PROVIDER_SITE_OTHER): Payer: Medicaid Other | Admitting: Obstetrics & Gynecology

## 2014-10-05 VITALS — BP 114/78 | HR 86 | Ht 64.0 in | Wt 231.0 lb

## 2014-10-05 DIAGNOSIS — Z30013 Encounter for initial prescription of injectable contraceptive: Secondary | ICD-10-CM

## 2014-10-05 LAB — POCT URINE PREGNANCY: Preg Test, Ur: NEGATIVE

## 2014-10-05 MED ORDER — MEDROXYPROGESTERONE ACETATE 150 MG/ML IM SUSP: 150.0000 mg | INTRAMUSCULAR | Status: DC

## 2014-10-05 NOTE — Progress Notes (Signed)
Subjective:    Tanya Moreno is a 23 y.o. female who presents for contraception counseling. The patient has no complaints today. The patient is sexually active. Pertinent past medical history: sexually transmitted diseases.  The information documented in the HPI was reviewed and verified.  Menstrual History: OB History   Grav Para Term Preterm Abortions TAB SAB Ect Mult Living   2 2 2       2        Patient's last menstrual period was 09/23/2014.   Patient Active Problem List   Diagnosis Date Noted  . Latex allergy 08/26/2011   Past Medical History  Diagnosis Date  . No pertinent past medical history   . Gonorrhea   . Chlamydia   . Genital HSV     Last outbreak Nov 2013  . Trichomonas     Past Surgical History  Procedure Laterality Date  . No past surgeries      Current outpatient prescriptions:medroxyPROGESTERone (DEPO-PROVERA) 150 MG/ML injection, Inject 1 mL (150 mg total) into the muscle every 3 (three) months., Disp: 1 mL, Rfl: 3 Allergies  Allergen Reactions  . Latex Swelling    History  Substance Use Topics  . Smoking status: Current Every Day Smoker  . Smokeless tobacco: Never Used  . Alcohol Use: No    Family History  Problem Relation Age of Onset  . Heart disease Father   . Kidney disease Maternal Aunt        Review of Systems Constitutional: negative for weight loss Genitourinary:negative for abnormal menstrual periods and vaginal discharge   Objective:   BP 114/78  Pulse 86  Ht 5\' 4"  (1.626 m)  Wt 104.781 kg (231 lb)  BMI 39.63 kg/m2  LMP 09/23/2014  Breastfeeding? No    Lab Review Urine pregnancy test Labs reviewed no Radiologic studies reviewed no  50% of 15 min visit spent on counseling and coordination of care.   Assessment:    23 y.o., continuing Depo-Provera injections, no contraindications.   Plan:     Meds ordered this encounter  Medications  . medroxyPROGESTERone (DEPO-PROVERA) 150 MG/ML injection    Sig: Inject  1 mL (150 mg total) into the muscle every 3 (three) months.    Dispense:  1 mL    Refill:  3   Orders Placed This Encounter  Procedures  . POCT urine pregnancy    Follow up as needed.

## 2014-10-08 ENCOUNTER — Encounter: Payer: Self-pay | Admitting: Obstetrics & Gynecology

## 2014-10-19 ENCOUNTER — Ambulatory Visit (INDEPENDENT_AMBULATORY_CARE_PROVIDER_SITE_OTHER): Payer: Medicaid Other | Admitting: *Deleted

## 2014-10-19 VITALS — BP 130/85 | HR 71 | Wt 230.0 lb

## 2014-10-19 DIAGNOSIS — Z3202 Encounter for pregnancy test, result negative: Secondary | ICD-10-CM

## 2014-10-19 DIAGNOSIS — Z30013 Encounter for initial prescription of injectable contraceptive: Secondary | ICD-10-CM

## 2014-10-19 LAB — POCT URINE PREGNANCY: Preg Test, Ur: NEGATIVE

## 2014-10-19 MED ORDER — MEDROXYPROGESTERONE ACETATE 150 MG/ML IM SUSP
150.0000 mg | INTRAMUSCULAR | Status: AC
  Administered 2014-10-19 – 2015-07-02 (×4): 150 mg via INTRAMUSCULAR

## 2014-10-19 NOTE — Progress Notes (Signed)
Pt is in office today for depo injection.  Pt is starting depo today.  [facility].  UPT in office today is negative.  Pt was given depo injection in right deltoid.  Pt tolerated well.   Pt advised to RTO on 01-10-15 for next injection.  BP 130/85 mmHg  Pulse 71  Wt 230 lb (104.327 kg)  LMP 09/23/2014  Administrations This Visit    medroxyPROGESTERone (DEPO-PROVERA) injection 150 mg    Administered Action Dose Route Administered By         10/19/2014 Given 150 mg Intramuscular Lanney GinsSuzanne D Jakaylah Schlafer, CMA

## 2014-10-22 ENCOUNTER — Encounter: Payer: Self-pay | Admitting: Obstetrics & Gynecology

## 2014-12-03 ENCOUNTER — Encounter: Payer: Self-pay | Admitting: *Deleted

## 2014-12-04 ENCOUNTER — Encounter: Payer: Self-pay | Admitting: Obstetrics & Gynecology

## 2015-01-10 ENCOUNTER — Ambulatory Visit: Payer: Medicaid Other

## 2015-01-11 ENCOUNTER — Ambulatory Visit (INDEPENDENT_AMBULATORY_CARE_PROVIDER_SITE_OTHER): Payer: Medicaid Other | Admitting: *Deleted

## 2015-01-11 VITALS — BP 122/78 | HR 81 | Temp 97.0°F | Ht 64.0 in | Wt 233.0 lb

## 2015-01-11 DIAGNOSIS — Z3042 Encounter for surveillance of injectable contraceptive: Secondary | ICD-10-CM

## 2015-01-11 NOTE — Progress Notes (Signed)
Patient in the office for a Depo injection. Patient is on time for her injection.  Patient tolerated injection well.  Patient to return for next injection on 04-05-15.  BP 122/78 mmHg  Pulse 81  Temp(Src) 97 F (36.1 C)  Ht 5\' 4"  (1.626 m)  Wt 233 lb (105.688 kg)  BMI 39.97 kg/m2   Administrations This Visit    medroxyPROGESTERone (DEPO-PROVERA) injection 150 mg    Administered Action Dose Route Administered By         01/11/2015 Given 150 mg Intramuscular Shelda PalAndrea Lynn Shatima Zalar, LPN

## 2015-04-05 ENCOUNTER — Ambulatory Visit: Payer: Medicaid Other

## 2015-04-08 ENCOUNTER — Ambulatory Visit (INDEPENDENT_AMBULATORY_CARE_PROVIDER_SITE_OTHER): Payer: Medicaid Other | Admitting: *Deleted

## 2015-04-08 ENCOUNTER — Encounter: Payer: Self-pay | Admitting: *Deleted

## 2015-04-08 VITALS — BP 124/88 | HR 91 | Temp 98.5°F | Wt 235.8 lb

## 2015-04-08 DIAGNOSIS — Z30013 Encounter for initial prescription of injectable contraceptive: Secondary | ICD-10-CM

## 2015-04-08 DIAGNOSIS — Z3040 Encounter for surveillance of contraceptives, unspecified: Secondary | ICD-10-CM

## 2015-04-08 DIAGNOSIS — Z3042 Encounter for surveillance of injectable contraceptive: Secondary | ICD-10-CM

## 2015-04-08 NOTE — Progress Notes (Signed)
Pt is in office for depo injection.  Pt is on time for injection.  Pt tolerated injection well. Pt has no other concerns today. Pt advised to RTO on June 30, 2015 for next injection.  BP 124/88 mmHg  Pulse 91  Temp(Src) 98.5 F (36.9 C)  Wt 235 lb 12.8 oz (106.958 kg)  Administrations This Visit    medroxyPROGESTERone (DEPO-PROVERA) injection 150 mg    Admin Date Action Dose Route Administered By         04/08/2015 Given 150 mg Intramuscular Lanney GinsSuzanne D Peola Joynt, CMA

## 2015-07-01 ENCOUNTER — Ambulatory Visit: Payer: Self-pay

## 2015-07-02 ENCOUNTER — Ambulatory Visit (INDEPENDENT_AMBULATORY_CARE_PROVIDER_SITE_OTHER): Payer: Medicaid Other | Admitting: *Deleted

## 2015-07-02 ENCOUNTER — Ambulatory Visit: Payer: Self-pay

## 2015-07-02 VITALS — BP 130/82 | HR 86 | Temp 98.2°F | Ht 64.0 in | Wt 236.0 lb

## 2015-07-02 DIAGNOSIS — Z3042 Encounter for surveillance of injectable contraceptive: Secondary | ICD-10-CM

## 2015-07-02 DIAGNOSIS — Z3040 Encounter for surveillance of contraceptives, unspecified: Secondary | ICD-10-CM | POA: Diagnosis not present

## 2015-07-02 NOTE — Progress Notes (Signed)
Patient in office for a Depo Injection. Patient is on time for her injection. Patient tolerated injection well. Patient due for next injection on September 23, 2015.   BP 130/82 mmHg  Pulse 86  Temp(Src) 98.2 F (36.8 C)  Ht  (1.626 m)  Wt 236 lb (107.049 kg)  BMI 40.49 kg/m2   Administrations This Visit    medroxyPROGESTERone (DEPO-PROVERA) injection 150 mg    Admin Date Action Dose Route Administered By         07/02/2015 Given 150 mg Intramuscular Henriette Combs, LPN

## 2015-09-18 ENCOUNTER — Ambulatory Visit: Payer: Self-pay

## 2015-09-23 ENCOUNTER — Ambulatory Visit: Payer: Medicaid Other

## 2015-09-26 ENCOUNTER — Ambulatory Visit: Payer: Self-pay

## 2015-10-22 ENCOUNTER — Other Ambulatory Visit: Payer: Medicaid Other

## 2015-11-07 ENCOUNTER — Encounter (HOSPITAL_COMMUNITY): Payer: Self-pay | Admitting: *Deleted

## 2015-11-07 ENCOUNTER — Emergency Department (HOSPITAL_COMMUNITY)
Admission: EM | Admit: 2015-11-07 | Discharge: 2015-11-07 | Disposition: A | Discharge status: Home / Self Care | Payer: Medicaid Other | Attending: Emergency Medicine | Admitting: Emergency Medicine

## 2015-11-07 DIAGNOSIS — Z8619 Personal history of other infectious and parasitic diseases: Secondary | ICD-10-CM | POA: Insufficient documentation

## 2015-11-07 DIAGNOSIS — J069 Acute upper respiratory infection, unspecified: Secondary | ICD-10-CM | POA: Insufficient documentation

## 2015-11-07 DIAGNOSIS — Z87891 Personal history of nicotine dependence: Secondary | ICD-10-CM | POA: Insufficient documentation

## 2015-11-07 DIAGNOSIS — Z793 Long term (current) use of hormonal contraceptives: Secondary | ICD-10-CM | POA: Insufficient documentation

## 2015-11-07 DIAGNOSIS — Z91040 Latex allergy status: Secondary | ICD-10-CM | POA: Insufficient documentation

## 2015-11-07 LAB — RAPID STREP SCREEN (MED CTR MEBANE ONLY): Streptococcus, Group A Screen (Direct): NEGATIVE

## 2015-11-07 MED ORDER — IBUPROFEN 600 MG PO TABS: 600.0000 mg | ORAL_TABLET | Freq: Four times a day (QID) | ORAL | Status: DC | PRN

## 2015-11-07 MED ORDER — IBUPROFEN 400 MG PO TABS
800.0000 mg | ORAL_TABLET | Freq: Once | ORAL | Status: AC
  Administered 2015-11-07: 800 mg via ORAL
  Filled 2015-11-07: qty 2

## 2015-11-07 MED ORDER — LIDOCAINE VISCOUS 2 % MT SOLN: 20.0000 mL | OROMUCOSAL | Status: DC | PRN

## 2015-11-07 NOTE — ED Provider Notes (Signed)
CSN: [id number]     Arrival date & time 11/07/15  4098 History  By signing my name below, I, Ronney Lion, attest that this documentation has been prepared under the direction and in the presence of [employer], PA-C. Electronically Signed: Ronney Lion, ED Scribe. 11/07/2015. 2:13 PM.    Chief Complaint  Patient presents with  . Headache  . Sore Throat  . Generalized Body Aches   The history is provided by the patient. No language interpreter was used.    HPI Comments: Tanya Moreno is a 24 y.o. female who presents to the Emergency Department complaining of a gradual-onset, constant, moderate sore throat that began 2 days ago. She also complains of an associated dry cough that also began 2 days ago, headache, and generalized myalgias. This is a new problem. She denies a history of asthma. She states she has not tried any treatments or medications for her symptoms; nothing makes her symptoms better or worse. She denies any fever or otalgia.   Past Medical History  Diagnosis Date  . No pertinent past medical history   . Gonorrhea   . Chlamydia   . Genital HSV     Last outbreak Nov 2013  . Trichomonas    Past Surgical History  Procedure Laterality Date  . No past surgeries     Family History  Problem Relation Age of Onset  . Heart disease Father   . Kidney disease Maternal Aunt    Social History  Substance Use Topics  . Smoking status: Former Games developer  . Smokeless tobacco: Former Neurosurgeon    Quit date: 11/10/2014  . Alcohol Use: No   OB History    Gravida Para Term Preterm AB TAB SAB Ectopic Multiple Living   Review of Systems  Constitutional: Negative for fever.  HENT: Positive for sore throat. Negative for ear pain.   Respiratory: Positive for cough.   Musculoskeletal: Positive for myalgias (generalized).  Neurological: Positive for headaches.   Allergies  Latex  Home Medications   Prior to Admission medications   Medication Sig Start Date End  Date Taking? Authorizing Provider  ibuprofen (ADVIL,MOTRIN) 600 MG tablet Take 1 tablet (600 mg total) by mouth every 6 (six) hours as needed. 11/07/15   May Ozment Patel-Mills, PA-C  lidocaine (XYLOCAINE) 2 % solution Use as directed 20 mLs in the mouth or throat as needed for mouth pain. 11/07/15   Hill Mackie Patel-Mills, PA-C  medroxyPROGESTERone (DEPO-PROVERA) 150 MG/ML injection Inject 1 mL (150 mg total) into the muscle every 3 (three) months. 10/05/14   Antionette Char, MD   BP 121/74 mmHg  Pulse 74  Temp(Src) 98.2 F (36.8 C) (Oral)  Resp 16  SpO2 100% Physical Exam  Constitutional: She is oriented to person, place, and time. She appears well-developed and well-nourished. No distress.  HENT:  Head: Normocephalic and atraumatic.  Mouth/Throat: Uvula is midline. No trismus in the jaw. No uvula swelling. Posterior oropharyngeal edema and posterior oropharyngeal erythema present. No oropharyngeal exudate.  Right tonsillar swelling and erythema. No exudates. Patent airway. Uvula is midline without swelling. No trismus or drooling.   Eyes: Conjunctivae and EOM are normal.  Neck: Neck supple. No tracheal deviation present.  Cardiovascular: Normal rate.   Pulmonary/Chest: Effort normal. No respiratory distress.  Lungs are clear to auscultation bilaterally.   Musculoskeletal: Normal range of motion.  Neurological: She is alert and oriented to person, place, and time.  Skin: Skin is warm and dry.  Psychiatric: She has a normal mood and affect. Her behavior is normal.  Nursing note and vitals reviewed.   ED Course  Procedures (including critical care time)  DIAGNOSTIC STUDIES: Oxygen Saturation is 94% on RA, normal by my interpretation.    COORDINATION OF CARE: 10:42 AM - Discussed treatment plan with pt at bedside which includes awaiting rapid strep screen. If negative, will treat as viral URI. Pt verbalized understanding and agreed to plan.   11:08 AM - Pt made aware of negative rapid  strep screen results. Will treat symptomatically. Advised f/u with PCP. Return precautions discussed. Pt verbalized understanding and agreed to plan.    Labs Review Labs Reviewed  [facility] ST[facility] SCREEN ([facility])  CULTURE, GROUP A STREP   MDM   Final diagnoses:  Upper respiratory infection, viral   Rapid strep screen negative. Patients symptoms are consistent with URI, likely viral etiology. Discussed that antibiotics are not indicated for viral infections. Pt will be discharged with symptomatic treatment. Resource guide given for f/u with PCP as needed. Return precautions discussed. Pt verbalizes understanding and is agreeable with plan. Pt is hemodynamically stable & in NAD prior to dc.   I personally performed the services described in this documentation, which was scribed in my presence. The recorded information has been reviewed and is accurate.  Medications  ibuprofen (ADVIL,MOTRIN) tablet 800 mg (800 mg Oral Given 11/07/15 1056)   Filed Vitals:   11/07/15 1018 11/07/15 1128  BP: 124/80 121/74  Pulse: 79 74  Temp: 98.9 F (37.2 C) 98.2 F (36.8 C)  Resp: 18 [address]      Aarohi Redditt Patel-Mills, PA-C 11/07/15 1415  Lyndal Pulleyaniel Knott, MD 11/08/15 [phone]

## 2015-11-07 NOTE — ED Notes (Signed)
Pt is here with 2 days of headache sorethroat, and body aches for the last 2 days.

## 2015-11-07 NOTE — Discharge Instructions (Signed)
Upper Respiratory Infection, Adult Follow-up with a primary care provider using the resource guide below. Return for throat swelling with difficulty breathing or swallowing, difficulty opening your mouth. Most upper respiratory infections (URIs) are a viral infection of the air passages leading to the lungs. A URI affects the nose, throat, and upper air passages. The most common type of URI is nasopharyngitis and is typically referred to as "the common cold." URIs run their course and usually go away on their own. Most of the time, a URI does not require medical attention, but sometimes a bacterial infection in the upper airways can follow a viral infection. This is called a secondary infection. Sinus and middle ear infections are common types of secondary upper respiratory infections. Bacterial pneumonia can also complicate a URI. A URI can worsen asthma and chronic obstructive pulmonary disease (COPD). Sometimes, these complications can require emergency medical care and may be life threatening.  CAUSES Almost all URIs are caused by viruses. A virus is a type of germ and can spread from one person to another.  RISKS FACTORS You may be at risk for a URI if:   You smoke.   You have chronic heart or lung disease.  You have a weakened defense (immune) system.   You are very young or very old.   You have nasal allergies or asthma.  You work in crowded or poorly ventilated areas.  You work in health care facilities or schools. SIGNS AND SYMPTOMS  Symptoms typically develop 2-3 days after you come in contact with a cold virus. Most viral URIs last 7-10 days. However, viral URIs from the influenza virus (flu virus) can last 14-18 days and are typically more severe. Symptoms may include:   Runny or stuffy (congested) nose.   Sneezing.   Cough.   Sore throat.   Headache.   Fatigue.   Fever.   Loss of appetite.   Pain in your forehead, behind your eyes, and over your  cheekbones (sinus pain).  Muscle aches.  DIAGNOSIS  Your health care provider may diagnose a URI by:  Physical exam.  Tests to check that your symptoms are not due to another condition such as:  Strep throat.  Sinusitis.  Pneumonia.  Asthma. TREATMENT  A URI goes away on its own with time. It cannot be cured with medicines, but medicines may be prescribed or recommended to relieve symptoms. Medicines may help:  Reduce your fever.  Reduce your cough.  Relieve nasal congestion. HOME CARE INSTRUCTIONS   Take medicines only as directed by your health care provider.   Gargle warm saltwater or take cough drops to comfort your throat as directed by your health care provider.  Use a warm mist humidifier or inhale steam from a shower to increase air moisture. This may make it easier to breathe.  Drink enough fluid to keep your urine clear or pale yellow.   Eat soups and other clear broths and maintain good nutrition.   Rest as needed.   Return to work when your temperature has returned to normal or as your health care provider advises. You may need to stay home longer to avoid infecting others. You can also use a face mask and careful hand washing to prevent spread of the virus.  Increase the usage of your inhaler if you have asthma.   Do not use any tobacco products, including cigarettes, chewing tobacco, or electronic cigarettes. If you need help quitting, ask your health care provider. PREVENTION  The best  way to protect yourself from getting a cold is to practice good hygiene.   Avoid oral or hand contact with people with cold symptoms.   Wash your hands often if contact occurs.  There is no clear evidence that vitamin C, vitamin E, echinacea, or exercise reduces the chance of developing a cold. However, it is always recommended to get plenty of rest, exercise, and practice good nutrition.  SEEK MEDICAL CARE IF:   You are getting worse rather than better.    Your symptoms are not controlled by medicine.   You have chills.  You have worsening shortness of breath.  You have brown or red mucus.  You have yellow or brown nasal discharge.  You have pain in your face, especially when you bend forward.  You have a fever.  You have swollen neck glands.  You have pain while swallowing.  You have white areas in the back of your throat. SEEK IMMEDIATE MEDICAL CARE IF:   You have severe or persistent:  Headache.  Ear pain.  Sinus pain.  Chest pain.  You have chronic lung disease and any of the following:  Wheezing.  Prolonged cough.  Coughing up blood.  A change in your usual mucus.  You have a stiff neck.  You have changes in your:  Vision.  Hearing.  Thinking.  Mood. MAKE SURE YOU:   Understand these instructions.  Will watch your condition.  Will get help right away if you are not doing well or get worse.   This information is not intended to replace advice given to you by your health care provider. Make sure you discuss any questions you have with your health care provider.   Document Released: 05/19/2001 Document Revised: 04/09/2015 Document Reviewed: 02/28/2014 Elsevier Interactive Patient Education 2016 [employer].  Emergency Department Resource Guide 1) Find a Doctor and Pay Out of Pocket Although you won't have to find out who is covered by your insurance plan, it is a good idea to ask around and get recommendations. You will then need to call the office and see if the doctor you have chosen will accept you as a new patient and what types of options they offer for patients who are self-pay. Some doctors offer discounts or will set up payment plans for their patients who do not have insurance, but you will need to ask so you aren't surprised when you get to your appointment.  2) Contact Your Local Health Department Not all health departments have doctors that can see patients for sick visits, but  many do, so it is worth a call to see if yours does. If you don't know where your local health department is, you can check in your phone book. The CDC also has a tool to help you locate your state's health department, and many state websites also have listings of all of their local health departments.  3) Find a Walk-in Clinic If your illness is not likely to be very severe or complicated, you may want to try a walk in clinic. These are popping up all over the country in pharmacies, drugstores, and shopping centers. They're usually staffed by nurse practitioners or physician assistants that have been trained to treat common illnesses and complaints. They're usually fairly quick and inexpensive. However, if you have serious medical issues or chronic medical problems, these are probably not your best option.  No Primary Care Doctor: - Call Health Connect at  [phone] - they can help you locate a primary  care doctor that  accepts your insurance, provides certain services, etc. - Physician Referral Service- [phone]  Chronic Pain Problems: Organization         Address  Phone   Notes  [facility]  [phone]) [phone] Patients need to be referred by their primary care doctor.   Medication Assistance: Organization         Address  Phone   Notes  [facility]ication [facility] [address] [place], [place] [address] [phone]) [phone] --Must be a resident of [place] [place] -- Must have NO insurance coverage whatsoever (no Medicaid/ Medicare, etc.) -- The pt. MUST have a primary care doctor that directs their care regularly and follows them in the community   [facility]ist  [phone]   [employer]  [phone]) [phone]    Agencies that provide inexpensive medical care: Organization         Address  Phone   Notes  [facility]  [phone]   [facility]    [phone]   [facility] [address] [place], [place] [address] [phone]   [facility] [address], [place] [phone]) [phone]   [facility]anned Parenthood    [phone](336) [phone]   [facility]    [phone]   [facility]  [address], [place] Phone:  [phone], Fax:  [phone]) [phone] Hours of Operation:  9 am - 6 pm, M-F.  Also accepts Medicaid/Medicare and self-pay.  [facility]  [address], [place] Phone: [phone](336) [phone], Fax: [phone]. Hours of Operation:  8:30 am - 5:30 pm, M-F.  Also accepts Medicaid and self-pay.  [facility] [place] [address], [place] Phone: [phone]) [phone]   [facility] [address], [place], [place] [phone], Ext. [phone] Mondays & Thursdays: 7-9 AM.  First 15 patients are seen on a first come, first serve basis.    Medicaid-accepting [facility] Providers:  Organization         Address  Phone   Notes  [facility] [address], [place] [phone]) [phone] Also accepts self-pay patients.  [facility] [address], [place]  [phone](336) [phone]   [facility] [address], [place] [phone](336) [phone]   [facility] [address] [place] [address], [place] [phone]) [phone]   Renaye RakersVeita Bland [address], [place]   [phone] Only accepts [place] Access [place] patients after they have their name applied to their card.   Self-Pay (no insurance) in [facility]:  Organization         Address  Phone   Notes  Sickle Cell Patients, [facility] [address], [place] [phone]) [phone]   [facility] [address], [place] [phone]) [phone]   [facility] [place]  [address], [place] [phone]) [phone]   [facility]/Dr. Osei-Bonsu  [address] [place] [address], [place] or  [phone], [place] [phone] Phone number for both [place] and [place] locations is the same.  [facility] [address], [place] [phone]   [facility] [address] [place] [address], [place] or [address] [phone]) [phone]  [facility] [address], [place] [phone], phone; [phone], fax Sees patients 1st and 3rd Saturday of every month.  Must not qualify for public or private insurance (i.e. Medicaid, Medicare, [place] Health Choice, Veterans' Benefits)  Household income should be no more than 200% of the poverty level The clinic cannot treat you if you are pregnant or think you are pregnant  Sexually transmitted diseases are not treated at the clinic.    Dental Care: Organization         Address  Phone  Notes  [facility] Department of [facility] [address] [place], [place] [phone] Accepts children up to age 38 who are enrolled in [place] or [place] Health Choice; pregnant women with a Medicaid card; and children who have applied for Medicaid or [place] Health Choice, but were declined, whose parents can pay a reduced fee at time of service.  [facility] Department of [facility]  [address], [place] [phone] Accepts children up to age 11 who are enrolled in [place] or [place] Health Choice; pregnant women with a Medicaid card; and children who have applied for Medicaid or  Health Choice, but were declined, whose parents can pay a reduced fee at time of service.  [facility]  [address] [place], [place] [phone] Patients are seen by appointment only. Walk-ins are not accepted. [facility] will see patients 41 years of age and older. Monday - Tuesday (8am-5pm) Most Wednesdays (8:30-5pm) $30 per visit, cash only  [facility] Adult Dental Access PROGRAM  [address], [facility] [phone] Patients are seen by appointment only. Walk-ins are not accepted. [facility] will see patients 36 years of age and older. One Wednesday Evening (Monthly: Volunteer Based).  $30 per visit, cash only  [employer]  [phone] for adults; Children under age 21, call Graduate Pediatric Dentistry at [phone]. Children aged 53-14, please call [phone] to request a pediatric application.  Dental services are provided in all areas of dental care including fillings, crowns and bridges, complete and partial dentures, implants, gum treatment, root canals, and extractions. Preventive care is also provided. Treatment is provided to both adults and children. Patients are selected via a lottery and there is often a waiting list.   [facility] [address] [place]  [phone] [email or web address]   [facility] [address] [place], [place] [phone], Ext. [phone] Second and Fourth Thursday of each month, opens at 6:30 AM; Clinic ends at 9 AM.  Patients are seen on a first-come first-served basis, and a limited number are seen during each clinic.   [facility]  [address] Ether [place]ins [place], [place] [phone]   Eligibility Requirements You must have lived in [place], [place], or [place] counties for at least the last three months.   You cannot be eligible for state or federal sponsored [place], including [employer], [place], or [employer].   You generally cannot be eligible for healthcare insurance through your employer.    How to apply: Eligibility screenings are held every Tuesday and Wednesday afternoon from 1:00 pm until 4:00 pm. You do not need an appointment for the interview!  [facility] [address] [place], [place] [phone]   [facility] Health Department  [phone]   [facility] Health Department  [phone]   [facility]  Department  [phone]    Behavioral Health Resources in the Community: Intensive Outpatient Programs Organization         Address  Phone  Notes  [facility] [address] [place], [place] [phone]   [facility] Outpatient [address] [place], [place] [phone]   [facility] [address] [place], [place]  [phone]   [facility] Mental Health [address]  [place], [place] [phone] or [phone]   Substance Abuse Resources Organization         Address  Phone  Notes  Alcohol [facility] Drug Services  [phone]   [facility]  [phone]   [facility] [place]  [phone]   Floydene Flock  [phone]   Residential & Outpatient Substance Abuse Program  [phone]   Psychological Services Organization         Address  Phone  Notes  [facility]  [phone]   [facility]  [phone]   [facility] Mental Health [address], [place] [phone] or [phone]    Mobile Crisis Teams Organization         Address  Phone  Notes  [facility], Mobile Crisis Care Unit  [phone]   [facility]  [address] [place], [place] [phone]   Doristine Locks [address] [place] [phone]    Self-Help/Support Groups Organization         Address  Phone             Notes  [facility] - variety of support groups  [phone]- I7437963 Call for more information  Narcotics Anonymous (NA), [facility] [address], [employer] [place]  2 meetings at this location   Statistician         Address  Phone  Notes  [facility] [address],    [place]  [phone]   [facility]  [address] [place] [address], [place] [address], [place], [place] [phone]   [facility] [address] [place], [place] [phone]  Admissions: 8am-3pm M-F  [facility] [address],    [place], [place] [phone]   [facility] [address] [place], [place], [place] [phone]   [facility] [address]  [place], [place] [phone]   [facility] - Intensive Outpatient [address], [place], [place] [phone]   [facility] ([facility] [address] [place]  [place], [place] [phone] or [phone]   [facility] ([facility]) [address] [place], [place] [phone] Accepts Medicaid  [facility] [place] [address]  [place] [phone] Substance Abuse/Addiction Treatment   [facility] Organization         Address  Phone  Notes  [facility]  [phone]   Angie Fava, PhD [address]vin Kn[address] [place], [place]   [phone] or [phone]   [facility] Behavioral   [address] [place], [place] [phone]   [facility] [address], [place], [place] [phone] Insurance/Medicaid/sponsorship through [employer] and Families [address]                                    [place], [place] [phone] Therapy/tele-psych/case  [facility]  [place], [place] [phone]    Dr. Adele Schilder  [phone]   [facility] Dept. 1) [address], [place] 2) [place] 3)  [place] [address], [place] [phone]  [phone]   [place] [phone] or [phone] (After Hours)

## 2015-11-09 LAB — CULTURE, GROUP A STREP: Strep A Culture: NEGATIVE

## 2016-04-17 ENCOUNTER — Encounter (HOSPITAL_COMMUNITY): Payer: Self-pay | Admitting: *Deleted

## 2016-04-17 ENCOUNTER — Emergency Department (HOSPITAL_COMMUNITY)
Admission: EM | Admit: 2016-04-17 | Discharge: 2016-04-17 | Disposition: A | Discharge status: Home / Self Care | Payer: Medicaid Other | Attending: Emergency Medicine | Admitting: Emergency Medicine

## 2016-04-17 ENCOUNTER — Emergency Department (HOSPITAL_COMMUNITY): Payer: Medicaid Other

## 2016-04-17 DIAGNOSIS — W1842XA Slipping, tripping and stumbling without falling due to stepping into hole or opening, initial encounter: Secondary | ICD-10-CM | POA: Insufficient documentation

## 2016-04-17 DIAGNOSIS — S93401A Sprain of unspecified ligament of right ankle, initial encounter: Secondary | ICD-10-CM

## 2016-04-17 DIAGNOSIS — Y998 Other external cause status: Secondary | ICD-10-CM | POA: Insufficient documentation

## 2016-04-17 DIAGNOSIS — Z8619 Personal history of other infectious and parasitic diseases: Secondary | ICD-10-CM | POA: Insufficient documentation

## 2016-04-17 DIAGNOSIS — Z91040 Latex allergy status: Secondary | ICD-10-CM | POA: Insufficient documentation

## 2016-04-17 DIAGNOSIS — Z87891 Personal history of nicotine dependence: Secondary | ICD-10-CM | POA: Insufficient documentation

## 2016-04-17 DIAGNOSIS — Y9289 Other specified places as the place of occurrence of the external cause: Secondary | ICD-10-CM | POA: Insufficient documentation

## 2016-04-17 DIAGNOSIS — Y9389 Activity, other specified: Secondary | ICD-10-CM | POA: Insufficient documentation

## 2016-04-17 MED ORDER — NAPROXEN 500 MG PO TABS: 500.0000 mg | ORAL_TABLET | Freq: Two times a day (BID) | ORAL | Status: DC

## 2016-04-17 NOTE — ED Provider Notes (Signed)
CSN: [id number]     Arrival date & time 04/17/16  1921 History  By signing my name below, I, Marisue Humble, attest that this documentation has been prepared under the direction and in the presence of non-physician practitioner, Sharilyn Sites, PA-C. Electronically Signed: Marisue Humble, Scribe. 04/17/2016. 8:03 PM.    Chief Complaint  Patient presents with  . Ankle Injury   The history is provided by the patient. No language interpreter was used.   HPI Comments:  Tanya Moreno is a 25 y.o. female who presents to the Emergency Department complaining of constant 8/10 right ankle pain and swelling s/p twisting her ankle yesterday. Pt stepped in a hole and twisted her right ankle. No alleviating factors noted or treatments attempted PTA. She has ambulated with a limp since the injury. Denies previous injury to right ankle or numbness/weakness of right foot.  Past Medical History  Diagnosis Date  . No pertinent past medical history   . Gonorrhea   . Chlamydia   . Genital HSV     Last outbreak Nov 2013  . Trichomonas    Past Surgical History  Procedure Laterality Date  . No past surgeries     Family History  Problem Relation Age of Onset  . Heart disease Father   . Kidney disease Maternal Aunt    Social History  Substance Use Topics  . Smoking status: Former Games developer  . Smokeless tobacco: Former Neurosurgeon    Quit date: 11/10/2014  . Alcohol Use: No   OB History    Gravida Para Term Preterm AB TAB SAB Ectopic Multiple Living   Review of Systems  Musculoskeletal: Positive for arthralgias.  Neurological: Negative for numbness.  All other systems reviewed and are negative.  Allergies  Latex  Home Medications   Prior to Admission medications   Medication Sig Start Date End Date Taking? Authorizing Provider  ibuprofen (ADVIL,MOTRIN) 600 MG tablet Take 1 tablet (600 mg total) by mouth every 6 (six) hours as needed. 11/07/15   Hanna Patel-Mills, PA-C   lidocaine (XYLOCAINE) 2 % solution Use as directed 20 mLs in the mouth or throat as needed for mouth pain. 11/07/15   Hanna Patel-Mills, PA-C  medroxyPROGESTERone (DEPO-PROVERA) 150 MG/ML injection Inject 1 mL (150 mg total) into the muscle every 3 (three) months. 10/05/14   Antionette Char, MD   BP 125/67 mmHg  Pulse 80  Temp(Src) 99.1 F (37.3 C) (Oral)  Resp 24  SpO2 95%   Physical Exam  Constitutional: She is oriented to person, place, and time. She appears well-developed and well-nourished.  HENT:  Head: Normocephalic and atraumatic.  Mouth/Throat: Oropharynx is clear and moist.  Eyes: Conjunctivae and EOM are normal. Pupils are equal, round, and reactive to light.  Neck: Normal range of motion.  Cardiovascular: Normal rate, regular rhythm and normal heart sounds.   Pulmonary/Chest: Effort normal and breath sounds normal.  Abdominal: Soft. Bowel sounds are normal.  Musculoskeletal: Normal range of motion.  Right ankle with moderate swelling surrounding lateral malleolus; no gross deformity; pain with ROM but remains fully intact; DP pulse and capillary refil normal; moving all toes normally  Neurological: She is alert and oriented to person, place, and time.  Skin: Skin is warm and dry.  Psychiatric: She has a normal mood and affect.  Nursing note and vitals reviewed.   ED Course  ORTHOPEDIC INJURY TREATMENT Date/Time: 04/17/2016 8:58 PM Performed by: Allyne Gee,  LISA M Authorized by: Garlon HatchetSANDERS, LISA M Consent: Verbal consent obtained. Risks and benefits: risks, benefits and alternatives were discussed Consent given by: patient Patient understanding: patient states understanding of the procedure being performed Required items: required blood products, implants, devices, and special equipment available Patient identity confirmed: verbally with patient Injury location: ankle Location details: right ankle Injury type: soft tissue Pre-procedure neurovascular assessment:  neurovascularly intact Immobilization: splint Supplies used: aluminum splint Post-procedure neurovascular assessment: post-procedure neurovascularly intact Patient tolerance: Patient tolerated the procedure well with no immediate complications    DIAGNOSTIC STUDIES:  Oxygen Saturation is 95% on RA, adequate by my interpretation.    COORDINATION OF CARE:  8:01 PM Will discharge with ankle brace and crutches. Discussed treatment plan with pt at bedside and pt agreed to plan.  Labs Review Labs Reviewed - No data to display  Imaging Review Dg Ankle Complete Right  04/17/2016  CLINICAL DATA:  Pt rolled her ankle when she stepped in a small hole in the ground last night, lateral right ankle pain and swelling EXAM: RIGHT ANKLE - COMPLETE 3+ VIEW COMPARISON:  02/26/2014 FINDINGS: There is mild soft tissue swelling along the lateral aspect of the ankle. No acute fracture or subluxation. The mortise is intact. IMPRESSION: Soft tissue swelling without acute bony abnormality. Electronically Signed   By: Norva PavlovElizabeth  Brown M.D.   On: 04/17/2016 19:51   I have personally reviewed and evaluated these images and lab results as part of my medical decision-making.   EKG Interpretation None      MDM   Final diagnoses:  Ankle sprain, right, initial encounter   25 rolled female here with right ankle injury yesterday. She has swelling surrounding her lateral malleolus. She has no gross deformity on exam. Her foot remains neurovascularly intact. Screening x-ray negative for acute bony findings. Suspect sprain type injury. Patient placed in ASO brace and given crutches. Recommended to progress back to full weightbearing as tolerated. Also recommended to ice and elevate ankle at home to help with pain/swelling. She was given orthopedic follow-up if no improvement within the next week.  Discussed plan with patient, he/she acknowledged understanding and agreed with plan of care.  Return precautions given for  new or worsening symptoms.  I personally performed the services described in this documentation, which was scribed in my presence. The recorded information has been reviewed and is accurate.  Garlon HatchetLisa M Sanders, PA-C 04/17/16 2059  Tilden FossaElizabeth Rees, MD 04/18/16 [phone]

## 2016-04-17 NOTE — Discharge Instructions (Signed)
Take the prescribed medication as directed.  Recommend to ice/elevate ankle at home to help with pain/swelling. Use crutches for now, progress back to full weight bearing as tolerated. Follow-up with Dr. Roda ShuttersXu if no improvement in the next week. Return to the ED for new or worsening symptoms.

## 2016-04-17 NOTE — ED Notes (Signed)
The pt stepped in a hole and twisted her rt ankle yesterday.  Painful with sl swelling today  lmp  2 weeks ago

## 2016-04-17 NOTE — Progress Notes (Signed)
Orthopedic Tech Progress Note Patient Details:  [name] Oct 10, 1991 [id number] Applied ASO to Rt. ankle.  Pulses, sensation, motion intact before and after application.  Capillary refill less than 2 seconds before and after application.  Fit pt. for crutches and taught use of same. Ortho Devices Type of Ortho Device: ASO, Crutches Ortho Device/Splint Location: RLE Ortho Device/Splint Interventions: Application   Lesle ChrisGilliland, Elaf Clauson L 04/17/2016, 8:31 PM

## 2016-07-03 ENCOUNTER — Inpatient Hospital Stay (HOSPITAL_COMMUNITY)
Admission: AD | Admit: 2016-07-03 | Discharge: 2016-07-03 | Disposition: A | Discharge status: Home / Self Care | Payer: Medicaid Other | Source: Ambulatory Visit | Attending: Obstetrics & Gynecology | Admitting: Obstetrics & Gynecology

## 2016-07-03 ENCOUNTER — Encounter (HOSPITAL_COMMUNITY): Payer: Self-pay | Admitting: *Deleted

## 2016-07-03 DIAGNOSIS — Z91040 Latex allergy status: Secondary | ICD-10-CM | POA: Insufficient documentation

## 2016-07-03 DIAGNOSIS — R1030 Lower abdominal pain, unspecified: Secondary | ICD-10-CM

## 2016-07-03 DIAGNOSIS — A5602 Chlamydial vulvovaginitis: Secondary | ICD-10-CM

## 2016-07-03 DIAGNOSIS — Z87891 Personal history of nicotine dependence: Secondary | ICD-10-CM | POA: Insufficient documentation

## 2016-07-03 DIAGNOSIS — N72 Inflammatory disease of cervix uteri: Secondary | ICD-10-CM

## 2016-07-03 DIAGNOSIS — A5402 Gonococcal vulvovaginitis, unspecified: Secondary | ICD-10-CM

## 2016-07-03 LAB — URINALYSIS, ROUTINE W REFLEX MICROSCOPIC
Bilirubin Urine: NEGATIVE
Glucose, UA: NEGATIVE mg/dL
Ketones, ur: NEGATIVE mg/dL
NITRITE: NEGATIVE
PROTEIN: NEGATIVE mg/dL
SPECIFIC GRAVITY, URINE: 1.025 (ref 1.005–1.030)
pH: 6 (ref 5.0–8.0)

## 2016-07-03 LAB — WET PREP, GENITAL
Clue Cells Wet Prep HPF POC: NONE SEEN
Sperm: NONE SEEN
Trich, Wet Prep: NONE SEEN
Yeast Wet Prep HPF POC: NONE SEEN

## 2016-07-03 LAB — URINE MICROSCOPIC-ADD ON

## 2016-07-03 LAB — POCT PREGNANCY, URINE: PREG TEST UR: NEGATIVE

## 2016-07-03 MED ORDER — AZITHROMYCIN 500 MG PO TABS: 1000.0000 mg | ORAL_TABLET | Freq: Once | ORAL | 0 refills | Status: AC

## 2016-07-03 NOTE — MAU Note (Signed)
Been feeling movement in her stomach, been feeling sharp pains in bottom of her stomach.  For past 2 wks.  Neg HPT last wk.  Nausea once. No vomiting.  Has had diarrhea off and on, not daily

## 2016-07-03 NOTE — Discharge Instructions (Signed)
Cervicitis °Cervicitis is a soreness and puffiness (inflammation) of the cervix.  °HOME CARE °· Do not have sex (intercourse) until your doctor says it is okay. °· Do not have sex until your partner is treated or as told by your doctor. °· Take your antibiotic medicine as told. Finish it even if you start to feel better. °GET HELP IF:  °· Your symptoms that brought you to the doctor come back. °· You have a fever. °MAKE SURE YOU:  °· Understand these instructions. °· Will watch your condition. °· Will get help right away if you are not doing well or get worse. °  °This information is not intended to replace advice given to you by your health care provider. Make sure you discuss any questions you have with your health care provider. °  °Document Released: 09/01/2008 Document Revised: 11/28/2013 Document Reviewed: 05/17/2013 °Elsevier Interactive Patient Education ©2016 [employer] ° °

## 2016-07-03 NOTE — MAU Provider Note (Signed)
History     CSN: [id number]  Arrival date and time: 07/03/16 1135   First Provider Initiated Contact with Patient 07/03/16 1255      Chief Complaint  Patient presents with  . Abdominal Pain   Non-pregnant female c/o intermittent lower abdominal pain and "movement" x2 weeks. She also reports loose stools x1 weeks. No vomiting, one episode of nausea. No fevers. No urinary sx. Reports thin, white, non-odorous vaginal discharge. No new sexual partner for "years".   Abdominal Pain  Associated symptoms include diarrhea and nausea. Pertinent negatives include no constipation or vomiting.    Pertinent Gynecological History: Menses: LMP 04/10/16 Contraception: none Sexually transmitted diseases: past history: trich  Past Medical History:  Diagnosis Date  . Chlamydia   . Genital HSV    Last outbreak Nov 2013  . Gonorrhea   . No pertinent past medical history   . Trichomonas     Past Surgical History:  Procedure Laterality Date  . NO PAST SURGERIES      Family History  Problem Relation Age of Onset  . Heart disease Father   . Kidney disease Maternal Aunt     Social History  Substance Use Topics  . Smoking status: Former Games developer  . Smokeless tobacco: Former Neurosurgeon    Quit date: 11/10/2014  . Alcohol use No    Allergies:  Allergies  Allergen Reactions  . Latex Swelling    Mainly latex condoms    Prescriptions Prior to Admission  Medication Sig Dispense Refill Last Dose  . naproxen (NAPROSYN) 500 MG tablet Take 1 tablet (500 mg total) by mouth 2 (two) times daily with a meal. (Patient not taking: Reported on 07/03/2016) 30 tablet 0 Not Taking at Unknown time    Review of Systems  Constitutional: Negative.   Cardiovascular: Negative.   Gastrointestinal: Positive for abdominal pain, diarrhea and nausea. Negative for blood in stool, constipation and vomiting.   Physical Exam   Blood pressure 126/76, pulse 89, temperature 98.4 F (36.9 C), temperature source Oral,  resp. rate 18, weight 110.1 kg (242 lb 12.8 oz), last menstrual period 04/10/2016.  Physical Exam  Constitutional: She is oriented to person, place, and time. She appears well-developed and well-nourished.  HENT:  Head: Normocephalic and atraumatic.  Neck: Normal range of motion. Neck supple.  Cardiovascular: Normal rate.   Respiratory: Effort normal.  GI: Soft. She exhibits no distension and no mass. There is no tenderness. There is no rebound.  Genitourinary:  Genitourinary Comments: External: No lesions Vagina: rugated, parous, large amt thin yellow/green discharge in cul[place]ac, tiny erythemic areas on os, easily bled with gentle q-tip contact Uterus: non enlarged, anteverted, non tender, no CMT Adnexae: no masses, no tenderness left, no tenderness right No CMT   Musculoskeletal: Normal range of motion.  Neurological: She is alert and oriented to person, place, and time.  Skin: Skin is warm and dry.  Psychiatric: She has a normal mood and affect.   Results for orders placed or performed during the hospital encounter of 07/03/16 (from the past 24 hour(s))  Urinalysis, Routine w reflex microscopic (not at [facility]     Status: Abnormal   Collection Time: 07/03/16 11:55 AM  Result Value Ref Range   Color, Urine YELLOW YELLOW   APPearance CLOUDY (A) CLEAR   Specific Gravity, Urine 1.025 1.005 - 1.030   pH 6.0 5.0 - 8.0   Glucose, UA NEGATIVE NEGATIVE mg/dL   Hgb urine dipstick TRACE (A) NEGATIVE   Bilirubin Urine NEGATIVE NEGATIVE  Ketones, ur NEGATIVE NEGATIVE mg/dL   Protein, ur NEGATIVE NEGATIVE mg/dL   Nitrite NEGATIVE NEGATIVE   Leukocytes, UA LARGE (A) NEGATIVE  Urine microscopic-add on     Status: Abnormal   Collection Time: 07/03/16 11:55 AM  Result Value Ref Range   Squamous Epithelial / LPF 6-30 (A) NONE SEEN   WBC, UA 6-30 0 - 5 WBC/hpf   RBC / HPF 0-5 0 - 5 RBC/hpf   Bacteria, UA MANY (A) NONE SEEN  Pregnancy, urine POC     Status: None   Collection Time: 07/03/16  12:04 PM  Result Value Ref Range   Preg Test, Ur NEGATIVE NEGATIVE  Wet prep, genital     Status: Abnormal   Collection Time: 07/03/16  1:00 PM  Result Value Ref Range   Yeast Wet Prep HPF POC NONE SEEN NONE SEEN   Trich, Wet Prep NONE SEEN NONE SEEN   Clue Cells Wet Prep HPF POC NONE SEEN NONE SEEN   WBC, Wet Prep HPF POC MANY (A) NONE SEEN   Sperm NONE SEEN     MAU Course  Procedures  MDM Labs ordered and reviewed. No evidence of acute abdomen or pelvic pathology. GC/CMT pending. Stable for discharge home.  Assessment and Plan   1. Cervicitis   2. Lower abdominal pain  Discharge home Azithromycin 1gm po x1-Rx sent (pt request) Follow up in [facility] for AEX or prn  Return for worsening sx  Donette Larry, CNM 07/03/2016, 1:09 PM

## 2016-07-06 LAB — GC/CHLAMYDIA PROBE AMP (~~LOC~~) NOT AT ARMC
Chlamydia: POSITIVE — AB
NEISSERIA GONORRHEA: POSITIVE — AB

## 2016-08-21 ENCOUNTER — Emergency Department (HOSPITAL_COMMUNITY): Payer: Medicaid Other

## 2016-08-21 ENCOUNTER — Emergency Department (HOSPITAL_COMMUNITY)
Admission: EM | Admit: 2016-08-21 | Discharge: 2016-08-21 | Disposition: A | Discharge status: Home / Self Care | Payer: Medicaid Other | Attending: Emergency Medicine | Admitting: Emergency Medicine

## 2016-08-21 ENCOUNTER — Encounter (HOSPITAL_COMMUNITY): Payer: Self-pay | Admitting: *Deleted

## 2016-08-21 ENCOUNTER — Encounter (HOSPITAL_COMMUNITY): Payer: Self-pay

## 2016-08-21 DIAGNOSIS — R52 Pain, unspecified: Secondary | ICD-10-CM | POA: Insufficient documentation

## 2016-08-21 DIAGNOSIS — Z87891 Personal history of nicotine dependence: Secondary | ICD-10-CM | POA: Insufficient documentation

## 2016-08-21 DIAGNOSIS — J029 Acute pharyngitis, unspecified: Secondary | ICD-10-CM | POA: Insufficient documentation

## 2016-08-21 DIAGNOSIS — Z91040 Latex allergy status: Secondary | ICD-10-CM | POA: Insufficient documentation

## 2016-08-21 DIAGNOSIS — Z5321 Procedure and treatment not carried out due to patient leaving prior to being seen by health care provider: Secondary | ICD-10-CM | POA: Insufficient documentation

## 2016-08-21 LAB — RAPID STREP SCREEN (MED CTR MEBANE ONLY)
STREPTOCOCCUS, GROUP A SCREEN (DIRECT): NEGATIVE
Streptococcus, Group A Screen (Direct): NEGATIVE

## 2016-08-21 MED ORDER — PENICILLIN V POTASSIUM 500 MG PO TABS: 500.0000 mg | ORAL_TABLET | Freq: Two times a day (BID) | ORAL | 0 refills | Status: AC

## 2016-08-21 NOTE — ED Triage Notes (Addendum)
C/o URI sx, generalized body aches and sore throat, also productive cough (denies nvd or fever). No meds PTA. Body aches onset yesterday, sore throat onset 3d ago. Tonsils red and swollen. LS CTA and diminished.

## 2016-08-21 NOTE — ED Notes (Signed)
See PA note.

## 2016-08-21 NOTE — ED Notes (Signed)
Patient told this nurse she was leaving because she had to work in the morning.

## 2016-08-21 NOTE — ED Provider Notes (Signed)
[facility]-EMERGENCY DEPT Provider Note   CSN: [id number] Arrival date & time: 08/21/16  0755     History   Chief Complaint Chief Complaint  Patient presents with  . Sore Throat    HPI Tanya Moreno is a 25 y.o. female.  HPI  25 y.o. female presents to the Emergency Department today complaining of sore throat, cough, nasal congestion x 2 days. Notes coming in last night with fever. None today. No cough. Has not tried any medication PTA. Notes sick contacts at home. No N/V/D. Notes pain 9/10. Able to tolerate PO without difficulty. Full ROM of neck without pain. No CP/SOB/ABD pain. No other symptoms noted.    Past Medical History:  Diagnosis Date  . Chlamydia   . Genital HSV    Last outbreak Nov 2013  . Gonorrhea   . No pertinent past medical history   . Trichomonas     Patient Active Problem List   Diagnosis Date Noted  . Latex allergy 08/26/2011    Past Surgical History:  Procedure Laterality Date  . NO PAST SURGERIES      OB History    Gravida Para Term Preterm AB Living   2 2 2     2    SAB TAB Ectopic Multiple Live Births           2       Home Medications    Prior to Admission medications   Not on File    Family History Family History  Problem Relation Age of Onset  . Heart disease Father   . Kidney disease Maternal Aunt     Social History Social History  Substance Use Topics  . Smoking status: Former Games developermoker  . Smokeless tobacco: Former NeurosurgeonUser    Quit date: 11/10/2014  . Alcohol use No     Allergies   Latex   Review of Systems Review of Systems  Constitutional: Negative for fever.  HENT: Positive for congestion, postnasal drip, rhinorrhea, sinus pressure and sore throat. Negative for ear discharge, ear pain, trouble swallowing and voice change.   Respiratory: Negative for cough, shortness of breath and wheezing.   Cardiovascular: Negative for chest pain.   Physical Exam Updated Vital Signs BP 116/72 (BP Location: Left Arm)   Pulse  108   Temp 99.2 F (37.3 C) (Oral)   Resp 14   Ht 5\' 4"  (1.626 m)   Wt 99.8 kg   LMP 08/12/2016 (Exact Date)   SpO2 99%   BMI 37.76 kg/m   Physical Exam  Constitutional: She is oriented to person, place, and time. She appears well-developed and well-nourished. No distress.  HENT:  Head: Normocephalic and atraumatic.  Right Ear: Tympanic membrane, external ear and ear canal normal.  Left Ear: Tympanic membrane, external ear and ear canal normal.  Nose: Nose normal.  Mouth/Throat: Uvula is midline and mucous membranes are normal. No trismus in the jaw. No uvula swelling. Posterior oropharyngeal erythema present. No oropharyngeal exudate or tonsillar abscesses.  No Trismus. No Uvula deviation. Tonsils enlarged. Erythematous. No exudate.   Eyes: EOM are normal. Pupils are equal, round, and reactive to light.  Neck: Normal range of motion. Neck supple. No tracheal deviation present.  Full ROM of neck without pain  Cardiovascular: Normal rate, regular rhythm, S1 normal, S2 normal, normal heart sounds, intact distal pulses and normal pulses.   Pulmonary/Chest: Effort normal and breath sounds normal. No respiratory distress. She has no decreased breath sounds. She has no wheezes. She has  no rhonchi. She has no rales.  Abdominal: Normal appearance and bowel sounds are normal. There is no tenderness.  Musculoskeletal: Normal range of motion.  Neurological: She is alert and oriented to person, place, and time.  Skin: Skin is warm and dry.  Psychiatric: She has a normal mood and affect. Her speech is normal and behavior is normal. Thought content normal.   ED Treatments / Results  Labs (all labs ordered are listed, but only abnormal results are displayed) Labs Reviewed  RAPID STREP SCREEN (NOT AT [facility])    EKG  EKG Interpretation None       Radiology Dg Chest 2 View  Result Date: 08/21/2016 CLINICAL DATA:  Cough and shortness of breath today.  Weakness. EXAM: CHEST  2 VIEW  COMPARISON:  05/21/2014 FINDINGS: The cardiomediastinal contours are normal. The lungs are clear. Pulmonary vasculature is normal. No consolidation, pleural effusion, or pneumothorax. No acute osseous abnormalities are seen. IMPRESSION: No acute pulmonary process. Electronically Signed   By: Rubye Oaks M.D.   On: 08/21/2016 01:07    Procedures Procedures (including critical care time)  Medications Ordered in ED Medications - No data to display   Initial Impression / Assessment and Plan / ED Course  I have reviewed the triage vital signs and the nursing notes.  Pertinent labs & imaging results that were available during my care of the patient were reviewed by me and considered in my medical decision making (see chart for details).  Clinical Course    Final Clinical Impressions(s) / ED Diagnoses  I have reviewed and evaluated the relevant laboratory values I have reviewed and evaluated the relevant imaging studies.  I have reviewed the relevant previous healthcare records.I obtained HPI from historian.  ED Course:  Assessment: Pt is a 25yF presents with URI symptoms x 2 days.  Sore throat. No cough. Subjective fevers. No N/V/D. Able to tolerate PO. Full ROM of neck without pain. On exam, pt in NAD. VSS. Afebrile. Lungs CTA, Heart RRR. Abdomen nontender/soft. No Trismus. No Uvula deviation. Posterior oropharynx erythematous without exudate. Pt CXR negative for acute infiltrate. Strep negative. Culture pending.. Based on [facility], will treat with ABX. Pt will be discharged with symptomatic treatment.  Verbalizes understanding and is agreeable with plan. Pt is hemodynamically stable & in NAD prior to dc.  Disposition/Plan:  DC Home Additional Verbal discharge instructions given and discussed with patient.  Pt Instructed to f/u with PCP in the next week for evaluation and treatment of symptoms. Return precautions given Pt acknowledges and agrees with plan  Supervising Physician Maia Plan, MD   Final diagnoses:  Pharyngitis    New Prescriptions New Prescriptions   No medications on file     Audry Pili, PA-C 08/21/16 0911    Maia Plan, MD 08/21/16 2006

## 2016-08-21 NOTE — ED Triage Notes (Signed)
Per Pt, Pt is coming from home with complaints of cough, sore throat, and generalized body aches that started yesterday.

## 2016-08-21 NOTE — Discharge Instructions (Signed)
Please read and follow all provided instructions.  Your diagnoses today include:  1. Pharyngitis     Tests performed today include: Vital signs. See below for your results today.   Medications prescribed:  Take as prescribed   Home care instructions:  Follow any educational materials contained in this packet.  Follow-up instructions: Please follow-up with your primary care provider for further evaluation of symptoms and treatment   Return instructions:  Please return to the Emergency Department if you do not get better, if you get worse, or new symptoms OR  - Fever (temperature greater than 101.6F)  - Bleeding that does not stop with holding pressure to the area    -Severe pain (please note that you may be more sore the day after your accident)  - Chest Pain  - Difficulty breathing  - Severe nausea or vomiting  - Inability to tolerate food and liquids  - Passing out  - Skin becoming red around your wounds  - Change in mental status (confusion or lethargy)  - New numbness or weakness    Please return if you have any other emergent concerns.  Additional Information:  Your vital signs today were: BP 116/72 (BP Location: Left Arm)    Pulse 108    Temp 99.2 F (37.3 C) (Oral)    Resp 14    Ht 5\' 4"  (1.626 m)    Wt 99.8 kg    LMP 08/12/2016 (Exact Date)    SpO2 99%    BMI 37.76 kg/m  If your blood pressure (BP) was elevated above 135/85 this visit, please have this repeated by your doctor within one month. ---------------

## 2016-08-23 LAB — CULTURE, GROUP A STREP (THRC)

## 2016-10-21 ENCOUNTER — Encounter (HOSPITAL_COMMUNITY): Payer: Self-pay | Admitting: *Deleted

## 2016-10-21 ENCOUNTER — Emergency Department (HOSPITAL_COMMUNITY)
Admission: EM | Admit: 2016-10-21 | Discharge: 2016-10-21 | Disposition: A | Discharge status: Home / Self Care | Payer: Medicaid Other | Attending: Emergency Medicine | Admitting: Emergency Medicine

## 2016-10-21 DIAGNOSIS — W1830XA Fall on same level, unspecified, initial encounter: Secondary | ICD-10-CM | POA: Insufficient documentation

## 2016-10-21 DIAGNOSIS — Z87891 Personal history of nicotine dependence: Secondary | ICD-10-CM | POA: Insufficient documentation

## 2016-10-21 DIAGNOSIS — Y929 Unspecified place or not applicable: Secondary | ICD-10-CM | POA: Insufficient documentation

## 2016-10-21 DIAGNOSIS — J029 Acute pharyngitis, unspecified: Secondary | ICD-10-CM | POA: Insufficient documentation

## 2016-10-21 DIAGNOSIS — Y939 Activity, unspecified: Secondary | ICD-10-CM | POA: Insufficient documentation

## 2016-10-21 DIAGNOSIS — J028 Acute pharyngitis due to other specified organisms: Secondary | ICD-10-CM

## 2016-10-21 DIAGNOSIS — B9789 Other viral agents as the cause of diseases classified elsewhere: Secondary | ICD-10-CM

## 2016-10-21 DIAGNOSIS — Y999 Unspecified external cause status: Secondary | ICD-10-CM | POA: Insufficient documentation

## 2016-10-21 DIAGNOSIS — Z91040 Latex allergy status: Secondary | ICD-10-CM | POA: Insufficient documentation

## 2016-10-21 DIAGNOSIS — S8001XA Contusion of right knee, initial encounter: Secondary | ICD-10-CM

## 2016-10-21 LAB — RAPID STREP SCREEN (MED CTR MEBANE ONLY): Streptococcus, Group A Screen (Direct): NEGATIVE

## 2016-10-21 MED ORDER — NAPROXEN 375 MG PO TABS: 375.0000 mg | ORAL_TABLET | Freq: Two times a day (BID) | ORAL | 0 refills | Status: DC

## 2016-10-21 NOTE — ED Provider Notes (Signed)
[facility]-EMERGENCY DEPT Provider Note   CSN: [id number] Arrival date & time: 10/21/16  [id number]  By signing my name below, I, Freida Busmaniana Omoyeni, attest that this documentation has been prepared under the direction and in the presence of non-physician practitioner, Arthor CaptainAbigail Maymuna Detzel, PA-C. Electronically Signed: Freida Busmaniana Omoyeni, Scribe. 10/21/2016. 10:29 AM.    History   Chief Complaint Chief Complaint  Patient presents with  . Sore Throat  . Knee Pain   The history is provided by the patient. No language interpreter was used.     HPI Comments:  Tanya Moreno is a 25 y.o. female who presents to the Emergency Department complaining of sore throat x 1 day with moderate pain when swallowing. Pt reports sick contacts; states her friend has been diagnosed with strep. She denies fever and chills. She is able to swallow her secretions. No alleviating factors noted.   Pt is also complaining of  right knee pain s/p mechanical  fall last night. She notes she landed on the knee.    Past Medical History:  Diagnosis Date  . Chlamydia   . Genital HSV    Last outbreak Nov 2013  . Gonorrhea   . No pertinent past medical history   . Trichomonas     Patient Active Problem List   Diagnosis Date Noted  . Latex allergy 08/26/2011    Past Surgical History:  Procedure Laterality Date  . NO PAST SURGERIES      OB History    Gravida Para Term Preterm AB Living   2 2 2     2    SAB TAB Ectopic Multiple Live Births           2       Home Medications    Prior to Admission medications   Not on File    Family History Family History  Problem Relation Age of Onset  . Heart disease Father   . Kidney disease Maternal Aunt     Social History Social History  Substance Use Topics  . Smoking status: Former Games developermoker  . Smokeless tobacco: Former NeurosurgeonUser    Quit date: 11/10/2014  . Alcohol use No     Allergies   Latex   Review of Systems Review of Systems  Constitutional: Negative for chills  and fever.  HENT: Positive for sore throat.   Musculoskeletal: Positive for arthralgias.     Physical Exam Updated Vital Signs BP 119/69 (BP Location: Left Arm)   Pulse 78   Temp 98.8 F (37.1 C) (Oral)   Resp 17   Ht 5\' 4"  (1.626 m)   Wt 220 lb (99.8 kg)   LMP 10/04/2016   SpO2 98%   BMI 37.76 kg/m   Physical Exam  Constitutional: She is oriented to person, place, and time. She appears well-developed and well-nourished. No distress.  HENT:  Head: Normocephalic and atraumatic.  Bilateral tonsillar swelling and erythema; no exudates   Eyes: Conjunctivae are normal.  Cardiovascular: Normal rate.   Pulmonary/Chest: Effort normal.  Abdominal: She exhibits no distension.  Neurological: She is alert and oriented to person, place, and time.  Skin: Skin is warm and dry.  Psychiatric: She has a normal mood and affect.  Nursing note and vitals reviewed.    ED Treatments / Results  DIAGNOSTIC STUDIES:  Oxygen Saturation is 98% on RA, normal by my interpretation.    COORDINATION OF CARE:  10:29 AM Discussed treatment plan with pt at bedside and pt agreed to plan.  Labs (all  labs ordered are listed, but only abnormal results are displayed) Labs Reviewed  RAPID ST[facility] SCREEN ([facility])    EKG  EKG Interpretation None       Radiology No results found.  Procedures Procedures (including critical care time)  Medications Ordered in ED Medications - No data to display   Initial Impression / Assessment and Plan / ED Course  I have reviewed the triage vital signs and the nursing notes.  Pertinent labs & imaging results that were available during my care of the patient were reviewed by me and considered in my medical decision making (see chart for details).  Clinical Course     I personally performed the services described in this documentation, which was scribed in my presence. The recorded information has been reviewed and is accurate.     Final Clinical  Impressions(s) / ED Diagnoses   Final diagnoses:  Contusion of right knee, initial encounter  Sore throat (viral)    New Prescriptions New Prescriptions   No medications on file   Patient X-Ray negative for obvious fracture or dislocation. Pain managed in ED. Pt advised to follow up with orthopedics if symptoms persist for possibility of missed fracture diagnosis. Conservative therapy recommended and discussed. Patient will be dc home & is agreeable with above plan.  Pt afebrile without tonsillar exudate, negative strep. Presents with mild cervical lymphadenopathy, & dysphagia; diagnosis of viral pharyngitis. No abx indicated. DC w symptomatic tx for pain  Pt does not appear dehydrated, but did discuss importance of water rehydration. Presentation non concerning for PTA or infxn spread to soft tissue. No trismus or uvula deviation. Specific return precautions discussed. Pt able to drink water in ED without difficulty with intact air way. Recommended PCP follow up.     Arthor Captainbigail Aws Shere, PA-C 10/21/16 1053    Heide Scaleshristopher J Tegeler, MD 10/21/16 [phone]

## 2016-10-21 NOTE — ED Notes (Signed)
Declined W/C at D/C and was escorted to lobby by RN. 

## 2016-10-21 NOTE — ED Triage Notes (Signed)
Pt reports having a sore throat for several days, pain when swallowing. Denies fever. Also has right knee pain, thinks that she sprained her knee. Ambulatory and no distress noted at triage.

## 2016-10-21 NOTE — Discharge Instructions (Signed)
Get help right away if: °You have difficulty breathing. °You cannot swallow fluids, soft foods, or your saliva. °You have increased swelling in your throat or neck. °You have persistent nausea and vomiting. °

## 2016-10-23 ENCOUNTER — Encounter (HOSPITAL_COMMUNITY): Payer: Self-pay

## 2016-10-23 ENCOUNTER — Emergency Department (HOSPITAL_COMMUNITY)
Admission: EM | Admit: 2016-10-23 | Discharge: 2016-10-23 | Disposition: A | Discharge status: Home / Self Care | Payer: Medicaid Other | Attending: Emergency Medicine | Admitting: Emergency Medicine

## 2016-10-23 DIAGNOSIS — Z87891 Personal history of nicotine dependence: Secondary | ICD-10-CM | POA: Insufficient documentation

## 2016-10-23 DIAGNOSIS — H9201 Otalgia, right ear: Secondary | ICD-10-CM

## 2016-10-23 DIAGNOSIS — Z91040 Latex allergy status: Secondary | ICD-10-CM | POA: Insufficient documentation

## 2016-10-23 DIAGNOSIS — H6123 Impacted cerumen, bilateral: Secondary | ICD-10-CM | POA: Insufficient documentation

## 2016-10-23 LAB — CULTURE, GROUP A STREP (THRC)

## 2016-10-23 MED ORDER — DOCUSATE SODIUM 50 MG/5ML PO LIQD
20.0000 mg | Freq: Once | ORAL | Status: AC
Start: 2016-10-23 — End: 2016-10-23
  Administered 2016-10-23: 10 mg via OTIC
  Filled 2016-10-23 (×2): qty 10

## 2016-10-23 MED ORDER — NEOMYCIN-POLYMYXIN-HC 3.5-10000-1 OT SUSP: 5.0000 [drp] | Freq: Four times a day (QID) | OTIC | 0 refills | Status: DC

## 2016-10-23 NOTE — ED Notes (Signed)
Med ordered from pharmacy.

## 2016-10-23 NOTE — ED Provider Notes (Signed)
[facility]PT Provider Note   CSN: [id number] Arrival date & time: 10/23/16  1040   By signing my name below, I, [name], attest that this documentation has been prepared under the direction and in the presence of Shawn Joy, PA-C. Electronically Signed: Clovis PuAvnee Patel, ED Scribe. 10/23/16. 11:03 AM.   History   Chief Complaint Chief Complaint  Patient presents with  . Otalgia   The history is provided by the patient. No language interpreter was used.   HPI Comments:  Tanya Moreno is a 25 y.o. female who presents to the Emergency Department complaining of sudden onset, aching, moderate right sided ear pain which began in the AM today. Pt denies fevers, ear drainage, any associated symptoms or modifying factors at this time.      Past Medical History:  Diagnosis Date  . Chlamydia   . Genital HSV    Last outbreak Nov 2013  . Gonorrhea   . No pertinent past medical history   . Trichomonas     Patient Active Problem List   Diagnosis Date Noted  . Latex allergy 08/26/2011    Past Surgical History:  Procedure Laterality Date  . NO PAST SURGERIES      OB History    Gravida Para Term Preterm AB Living   2 2 2     2    SAB TAB Ectopic Multiple Live Births           2       Home Medications    Prior to Admission medications   Medication Sig Start Date End Date Taking? Authorizing Provider  naproxen (NAPROSYN) 375 MG tablet Take 1 tablet (375 mg total) by mouth 2 (two) times daily. 10/21/16   Arthor CaptainAbigail Harris, PA-C  neomycin-polymyxin-hydrocortisone (CORTISPORIN) 3.5-10000-1 otic suspension Place 5 drops into the right ear 4 (four) times daily. Use for 7 days. 10/23/16   Anselm PancoastShawn C Joy, PA-C    Family History Family History  Problem Relation Age of Onset  . Heart disease Father   . Kidney disease Maternal Aunt     Social History Social History  Substance Use Topics  . Smoking status: Former Games developermoker  . Smokeless tobacco: Former NeurosurgeonUser    Quit date:  11/10/2014  . Alcohol use No     Allergies   Latex   Review of Systems Review of Systems  Constitutional: Negative for fever.  HENT: Positive for ear pain. Negative for ear discharge and hearing loss.     Physical Exam Updated Vital Signs BP 132/85 (BP Location: Right Arm)   Pulse 88   Temp 98 F (36.7 C) (Oral)   Resp 18   Ht 5\' 4"  (1.626 m)   Wt 200 lb (90.7 kg)   LMP 10/04/2016   SpO2 98%   BMI 34.33 kg/m   Physical Exam  Constitutional: She appears well-developed and well-nourished. No distress.  HENT:  Head: Normocephalic and atraumatic.  Right Ear: External ear normal. Tympanic membrane is erythematous.  Left Ear: External ear and ear canal normal.  Cerumen blocking view of TM's bilaterally. Inflammation and erythema present in right canal. After cerumen impaction was cleared, TM was able to be visualized. The erythema and inflammation to the TM.  Eyes: Conjunctivae are normal.  Neck: Neck supple.  Cardiovascular: Normal rate and regular rhythm.   Pulmonary/Chest: Effort normal.  Neurological: She is alert.  Skin: Skin is warm and dry. She is not diaphoretic.  Psychiatric: She has a normal mood and affect. Her behavior  is normal.  Nursing note and vitals reviewed.   ED Treatments / Results  DIAGNOSTIC STUDIES:  Oxygen Saturation is 98% on RA, normal by my interpretation.    COORDINATION OF CARE:  10:59 AM Discussed treatment plan with pt at bedside and pt agreed to plan.  Labs (all labs ordered are listed, but only abnormal results are displayed) Labs Reviewed - No data to display  EKG  EKG Interpretation None       Radiology No results found.  Procedures .Ear Cerumen Removal Date/Time: 10/23/2016 12:24 PM Performed by: Anselm PancoastJOY, SHAWN C Authorized by: Anselm PancoastJOY, SHAWN C   Consent:    Consent obtained:  Verbal   Consent given by:  Patient   Risks discussed:  Bleeding, infection, incomplete removal, TM perforation and pain   Alternatives  discussed:  No treatment Procedure details:    Location:  R ear   Procedure type: irrigation   Post-procedure details:    Inspection:  TM intact   Hearing quality:  Normal   Patient tolerance of procedure:  Tolerated well, no immediate complications Comments:     Colace placed in the ear by the RN.    (including critical care time)  Medications Ordered in ED Medications  docusate (COLACE) 50 MG/5ML liquid 20 mg (10 mg Both Ears Given 10/23/16 1136)     Initial Impression / Assessment and Plan / ED Course  I have reviewed the triage vital signs and the nursing notes.  Pertinent labs & imaging results that were available during my care of the patient were reviewed by me and considered in my medical decision making (see chart for details).  Clinical Course     Patient presents with right ear pain beginning this morning. Bilateral cerumen impaction. Right ear canal was cleared here in ED to allow for evaluation of the TM. Possible early otitis media. Instructions given for future cerumen impactions. PCP follow-up.  Vitals:   10/23/16 1043 10/23/16 1044  BP: 132/85   Pulse: 88   Resp: 18   Temp: 98 F (36.7 C)   TempSrc: Oral   SpO2: 98%   Weight:  90.7 kg  Height:  5\' 4"  (1.626 m)      Final Clinical Impressions(s) / ED Diagnoses   Final diagnoses:  Bilateral impacted cerumen  Otalgia of right ear    New Prescriptions New Prescriptions   NEOMYCIN-POLYMYXIN-HYDROCORTISONE (CORTISPORIN) 3.5-10000-1 OTIC SUSPENSION    Place 5 drops into the right ear 4 (four) times daily. Use for 7 days.   I personally performed the services described in this documentation, which was scribed in my presence. The recorded information has been reviewed and is accurate.    Anselm PancoastShawn C Joy, PA-C 10/23/16 1227    Gwyneth SproutWhitney Plunkett, MD 10/24/16 [phone]

## 2016-10-23 NOTE — ED Triage Notes (Signed)
Woke up with rt. Ear ache,  Used OTC ear gtts for pain.

## 2016-10-23 NOTE — Discharge Instructions (Signed)
You have a buildup of ear wax in your ears. This was cleared today. Avoid using qtips as this can push the ear wax deeper and also injure the structures inside the ear. Use the ear drops beginning today. Administer 5 drops four times a day for 7 days. Follow up with a primary care provider for any future management.

## 2016-12-07 NOTE — L&D Delivery Note (Signed)
  Delivery Note At 12:19 AM a viable female was delivered via Vaginal, Spontaneous (Presentation: cephalic;LOA  ).  APGAR: 5, 9; weight 8 lb 10.8 oz (3935 g).   Placenta status: spontaneous, intact. Cord: 3 vessel  Episiotomy: None Lacerations: None Suture Repair: na Est. Blood Loss (mL): 250  Mom to postpartum.  Baby to Couplet care / Skin to Skin.  Rolm BookbinderAmber Arlis [facility] Fellow

## 2016-12-14 ENCOUNTER — Emergency Department (HOSPITAL_COMMUNITY)
Admission: EM | Admit: 2016-12-14 | Discharge: 2016-12-14 | Disposition: A | Discharge status: Home / Self Care | Payer: Self-pay | Attending: Emergency Medicine | Admitting: Emergency Medicine

## 2016-12-14 ENCOUNTER — Emergency Department (HOSPITAL_COMMUNITY): Payer: Self-pay

## 2016-12-14 ENCOUNTER — Encounter (HOSPITAL_COMMUNITY): Payer: Self-pay

## 2016-12-14 DIAGNOSIS — Z91040 Latex allergy status: Secondary | ICD-10-CM | POA: Insufficient documentation

## 2016-12-14 DIAGNOSIS — Z87891 Personal history of nicotine dependence: Secondary | ICD-10-CM | POA: Insufficient documentation

## 2016-12-14 DIAGNOSIS — J029 Acute pharyngitis, unspecified: Secondary | ICD-10-CM | POA: Insufficient documentation

## 2016-12-14 DIAGNOSIS — M791 Myalgia: Secondary | ICD-10-CM | POA: Insufficient documentation

## 2016-12-14 DIAGNOSIS — R52 Pain, unspecified: Secondary | ICD-10-CM

## 2016-12-14 LAB — RAPID STREP SCREEN (MED CTR MEBANE ONLY): STREPTOCOCCUS, GROUP A SCREEN (DIRECT): NEGATIVE

## 2016-12-14 MED ORDER — PREDNISONE 10 MG PO TABS: 20.0000 mg | ORAL_TABLET | Freq: Every day | ORAL | 0 refills | Status: AC

## 2016-12-14 MED ORDER — DEXAMETHASONE SODIUM PHOSPHATE 10 MG/ML IJ SOLN
10.0000 mg | Freq: Once | INTRAMUSCULAR | Status: AC
  Administered 2016-12-14: 10 mg via INTRAMUSCULAR
  Filled 2016-12-14: qty 1

## 2016-12-14 MED ORDER — AMOXICILLIN-POT CLAVULANATE 875-125 MG PO TABS: 1.0000 | ORAL_TABLET | Freq: Two times a day (BID) | ORAL | 0 refills | Status: AC

## 2016-12-14 NOTE — Discharge Instructions (Addendum)
You've been prescribed prednisone and Augmentin for your symptoms. Please take these as prescribed. Your symptoms should resolve within the next 3-5 days, at most 7 days. Return to the emergency department review experience difficulty controlling her oral secretions, difficulty opening her jaw, fevers, difficulty breathing or if her symptoms worsen.

## 2016-12-14 NOTE — ED Triage Notes (Signed)
Pt reports sore throat, cough, chills and body aches since Saturday.

## 2016-12-14 NOTE — ED Provider Notes (Signed)
[facility]-EMERGENCY DEPT Provider Note    By signing my name below, I, Earmon Phoenix, attest that this documentation has been prepared under the direction and in the presence of Sharen Heck, PA-C. Electronically Signed: Earmon Phoenix, ED Scribe. 12/14/16. 3:21 PM.    History   Chief Complaint Chief Complaint  Patient presents with  . Cough  . Sore Throat   HPI  Tanya Moreno is an obese 26 y.o. female who presents to the Emergency Department complaining of productive cough of phlegm and saliva that began three days ago. She reports associated nausea, nasal congestion, chills and generalized body aches. Pt states her symptoms began three days ago with a sore throat that has now resolved. She denies any known sick contacts. She has has not taken anything for pain relief. Pt denies modifying factors. She denies fever, vomiting, SOB, CP, abdominal pain, neck pain/stiffness.   Past Medical History:  Diagnosis Date  . Chlamydia   . Genital HSV    Last outbreak Nov 2013  . Gonorrhea   . No pertinent past medical history   . Trichomonas     Patient Active Problem List   Diagnosis Date Noted  . Latex allergy 08/26/2011    Past Surgical History:  Procedure Laterality Date  . NO PAST SURGERIES      OB History    Gravida Para Term Preterm AB Living   2 2 2     2    SAB TAB Ectopic Multiple Live Births           2       Home Medications    Prior to Admission medications   Medication Sig Start Date End Date Taking? Authorizing Provider  amoxicillin-clavulanate (AUGMENTIN) 875-125 MG tablet Take 1 tablet by mouth 2 (two) times daily. 12/14/16 12/21/16  Liberty Handy, PA-C  predniSONE (DELTASONE) 10 MG tablet Take 2 tablets (20 mg total) by mouth daily. 12/14/16 12/19/16  Liberty Handy, PA-C    Family History Family History  Problem Relation Age of Onset  . Heart disease Father   . Kidney disease Maternal Aunt     Social History Social History  Substance Use  Topics  . Smoking status: Former Games developer  . Smokeless tobacco: Former Neurosurgeon    Quit date: 11/10/2014  . Alcohol use No     Allergies   Latex   Review of Systems Review of Systems  Constitutional: Positive for chills. Negative for fever.  HENT: Positive for congestion. Negative for sore throat.   Eyes: Negative for visual disturbance.  Respiratory: Positive for cough. Negative for shortness of breath.   Cardiovascular: Negative for chest pain.  Gastrointestinal: Negative for abdominal pain, constipation, diarrhea, nausea and vomiting.  Genitourinary: Negative for difficulty urinating.  Musculoskeletal: Positive for myalgias. Negative for arthralgias, neck pain and neck stiffness.  Neurological: Negative for dizziness, weakness, light-headedness and headaches.     Physical Exam Updated Vital Signs BP 109/81 (BP Location: Right Arm)   Pulse 72   Temp 98.6 F (37 C) (Oral)   Resp 16   LMP 12/12/2016   SpO2 100%   Physical Exam  Constitutional: She is oriented to person, place, and time. She appears well-developed and well-nourished. No distress.  HENT:  Head: Normocephalic and atraumatic.  Right Ear: External ear normal.  Left Ear: External ear normal.  Nose: Nose normal.  Mouth/Throat: Oropharynx is clear and moist. No oropharyngeal exudate.  Non-bulging, pearly gray TMs with cone of light without lesions or erythema.  Erythematous and edematous tonsils R>L with exudates. R tonsil touching uvula although uvula still midline. No trismus.  No pooling of oral secretions. No hot potato voice.  Edematous and erythematous inferior turbinates bilaterally (R > L).   Eyes: Conjunctivae and EOM are normal. Pupils are equal, round, and reactive to light. No scleral icterus.  Neck: Normal range of motion. Neck supple. No JVD present.  Cardiovascular: Normal rate, regular rhythm and normal heart sounds.   No murmur heard. Pulmonary/Chest: Effort normal and breath sounds normal. She  has no wheezes.  Musculoskeletal: Normal range of motion. She exhibits no deformity.  Lymphadenopathy:    She has no cervical adenopathy.  Neurological: She is alert and oriented to person, place, and time.  Skin: Skin is warm and dry. Capillary refill takes less than 2 seconds.  Psychiatric: She has a normal mood and affect. Her behavior is normal. Judgment and thought content normal.  Nursing note and vitals reviewed.    ED Treatments / Results  DIAGNOSTIC STUDIES: Oxygen Saturation is 100% on RA, normal by my interpretation.   COORDINATION OF CARE: 3:19 PM- Will check rapid strep test. Will order injection of Decadron and prescribe Flonase. Pt verbalizes understanding and agrees to plan.  Medications  dexamethasone (DECADRON) injection 10 mg (10 mg Intramuscular Given 12/14/16 1557)    Labs (all labs ordered are listed, but only abnormal results are displayed) Labs Reviewed  RAPID STREP SCREEN (NOT AT [facility])  CULTURE, GROUP A STREP [facility])    EKG  EKG Interpretation None       Radiology Dg Chest 2 View  Result Date: 12/14/2016 CLINICAL DATA:  Productive cough for 4 days. EXAM: CHEST  2 VIEW COMPARISON:  08/21/2016 FINDINGS: The heart size and mediastinal contours are within normal limits. Both lungs are clear. The visualized skeletal structures are unremarkable. IMPRESSION: Negative.  No active cardiopulmonary disease. Electronically Signed   By: Myles Rosenthal M.D.   On: 12/14/2016 14:05    Procedures Procedures (including critical care time)  Medications Ordered in ED Medications  dexamethasone (DECADRON) injection 10 mg (10 mg Intramuscular Given 12/14/16 1557)     Initial Impression / Assessment and Plan / ED Course  I have reviewed the triage vital signs and the nursing notes.  Pertinent labs & imaging results that were available during my care of the patient were reviewed by me and considered in my medical decision making (see chart for details).  Clinical  Course    Erythema and edema of tonsils most significant on the right side with exudates bilaterally. Right tonsil is touching the uvula however uvula is still midline. No soft palate edema, erythema or tenderness. No trismus. No hot potato voice. No pooling of oral secretions. No neck stiffness or anterior neck pain with neck range of motion.  Chest x-ray is negative. Strep was negative. Given moderate edema of tonsils patient received Decadron in the emergency department. Vital signs are reassuring. Patient will be discharged with a short course of prednisone and Augmentin given the fact that the patient does not have a PCP and is not reliable to follow-up in the next 2-3 days, I wanted to cover her for bacterial tonsillitis to hopefully prevent tonsillar abscess. I have low suspicion for tonsillar abscess, retropharyngeal abscess at this point.  Patient was given strict ED return instructions, patient is aware that her symptoms could progress into a deep neck abscess. Patient verbalized understanding and is agreeable to discharge plan. Patient was discussed with Dr. Hermina Staggers who  agrees with ED treatment and discharge plan.  I personally performed the services described in this documentation, which was scribed in my presence. The recorded information has been reviewed and is accurate.   Final Clinical Impressions(s) / ED Diagnoses   Final diagnoses:  Sore throat  Body aches    New Prescriptions Discharge Medication List as of 12/14/2016  5:03 PM    START taking these medications   Details  amoxicillin-clavulanate (AUGMENTIN) 875-125 MG tablet Take 1 tablet by mouth 2 (two) times daily., Starting Mon 12/14/2016, Until Mon 12/21/2016, Print    predniSONE (DELTASONE) 10 MG tablet Take 2 tablets (20 mg total) by mouth daily., Starting Mon 12/14/2016, Until Sat 12/19/2016, Print         Liberty HandyClaudia J Eugine Bubb, PA-C 12/15/16 1618    Vanetta MuldersScott Zackowski, MD 12/15/16 [phone]

## 2016-12-14 NOTE — ED Notes (Signed)
Declined W/C at D/C and was escorted to lobby by RN. 

## 2016-12-16 LAB — CULTURE, GROUP A STREP (THRC)

## 2017-02-08 ENCOUNTER — Inpatient Hospital Stay (HOSPITAL_COMMUNITY)
Admission: AD | Admit: 2017-02-08 | Discharge: 2017-02-08 | Disposition: A | Discharge status: Home / Self Care | Payer: Medicaid Other | Source: Ambulatory Visit | Attending: Family Medicine | Admitting: Family Medicine

## 2017-02-08 ENCOUNTER — Encounter (HOSPITAL_COMMUNITY): Payer: Self-pay | Admitting: *Deleted

## 2017-02-08 ENCOUNTER — Inpatient Hospital Stay (HOSPITAL_COMMUNITY): Payer: Medicaid Other

## 2017-02-08 DIAGNOSIS — O26899 Other specified pregnancy related conditions, unspecified trimester: Secondary | ICD-10-CM

## 2017-02-08 DIAGNOSIS — O9989 Other specified diseases and conditions complicating pregnancy, childbirth and the puerperium: Secondary | ICD-10-CM

## 2017-02-08 DIAGNOSIS — O26891 Other specified pregnancy related conditions, first trimester: Secondary | ICD-10-CM | POA: Diagnosis not present

## 2017-02-08 DIAGNOSIS — Z87891 Personal history of nicotine dependence: Secondary | ICD-10-CM | POA: Diagnosis not present

## 2017-02-08 DIAGNOSIS — Z3A09 9 weeks gestation of pregnancy: Secondary | ICD-10-CM | POA: Insufficient documentation

## 2017-02-08 DIAGNOSIS — R109 Unspecified abdominal pain: Secondary | ICD-10-CM | POA: Insufficient documentation

## 2017-02-08 DIAGNOSIS — O3680X0 Pregnancy with inconclusive fetal viability, not applicable or unspecified: Secondary | ICD-10-CM

## 2017-02-08 LAB — CBC
HEMATOCRIT: 42 % (ref 36.0–46.0)
Hemoglobin: 14.5 g/dL (ref 12.0–15.0)
MCH: 30.3 pg (ref 26.0–34.0)
MCHC: 34.5 g/dL (ref 30.0–36.0)
MCV: 87.9 fL (ref 78.0–100.0)
Platelets: 351 10*3/uL (ref 150–400)
RBC: 4.78 MIL/uL (ref 3.87–5.11)
RDW: 12.5 % (ref 11.5–15.5)
WBC: 7.3 10*3/uL (ref 4.0–10.5)

## 2017-02-08 LAB — URINALYSIS, ROUTINE W REFLEX MICROSCOPIC
BACTERIA UA: NONE SEEN
BILIRUBIN URINE: NEGATIVE
Glucose, UA: NEGATIVE mg/dL
HGB URINE DIPSTICK: NEGATIVE
Ketones, ur: NEGATIVE mg/dL
NITRITE: NEGATIVE
PH: 6 (ref 5.0–8.0)
Protein, ur: NEGATIVE mg/dL
SPECIFIC GRAVITY, URINE: 1.016 (ref 1.005–1.030)

## 2017-02-08 LAB — WET PREP, GENITAL
Clue Cells Wet Prep HPF POC: NONE SEEN
SPERM: NONE SEEN
Trich, Wet Prep: NONE SEEN
Yeast Wet Prep HPF POC: NONE SEEN

## 2017-02-08 LAB — POCT PREGNANCY, URINE: Preg Test, Ur: POSITIVE — AB

## 2017-02-08 LAB — HCG, QUANTITATIVE, PREGNANCY: hCG, Beta Chain, Quant, S: 487 m[IU]/mL — ABNORMAL HIGH (ref <5)

## 2017-02-08 NOTE — MAU Note (Signed)
Been having pains in bottom of her stomach for past 2 day.  Pains are sharp, comes and goes.  No period since Dec, has not done a test

## 2017-02-08 NOTE — MAU Provider Note (Signed)
History     CSN: [id number]  Arrival date and time: 02/08/17 [id number]   First Provider Initiated Contact with Patient 02/08/17 1120       Chief Complaint  Patient presents with  . Abdominal Pain   HPI  Tanya Moreno is a 26 y.o. G3P2002 at [id number] by LMP who presents with abdominal pain. Pain started 2 days ago. Reports intermittent lower abdominal pain that she describes as sharp. Rates pain 7/10. Has not treated. Nothing makes better or worse. Denies n/v/d, constipation, dysuria, vaginal bleeding, or vaginal discharge.   OB History    Gravida Para Term Preterm AB Living   3 2 2     2    SAB TAB Ectopic Multiple Live Births           2      Past Medical History:  Diagnosis Date  . Chlamydia   . Genital HSV    Last outbreak Nov 2013  . Gonorrhea   . Trichomonas     Past Surgical History:  Procedure Laterality Date  . NO PAST SURGERIES      Family History  Problem Relation Age of Onset  . Heart disease Father   . Kidney disease Maternal Aunt     Social History  Substance Use Topics  . Smoking status: Former Games developermoker  . Smokeless tobacco: Former NeurosurgeonUser    Quit date: 11/10/2014  . Alcohol use No    Allergies:  Allergies  Allergen Reactions  . Latex Swelling    Mainly latex condoms    No prescriptions prior to admission.    Review of Systems  Constitutional: Negative for chills and fever.  Gastrointestinal: Positive for abdominal pain. Negative for constipation, diarrhea, nausea and vomiting.  Genitourinary: Negative for dysuria, vaginal bleeding and vaginal discharge.   Physical Exam   Blood pressure 130/76, pulse 78, temperature 98.5 F (36.9 C), temperature source Oral, resp. rate 18, weight 250 lb 12 oz (113.7 kg), last menstrual period 12/02/2016, SpO2 100 %.  Physical Exam  Nursing note and vitals reviewed. Constitutional: She is oriented to person, place, and time. She appears well-developed and well-nourished. No distress.  HENT:  Head:  Normocephalic and atraumatic.  Eyes: Conjunctivae are normal. Right eye exhibits no discharge. Left eye exhibits no discharge. No scleral icterus.  Neck: Normal range of motion.  Respiratory: Effort normal. No respiratory distress.  GI: Soft. She exhibits no distension. There is no tenderness. There is no rebound.  Genitourinary: Uterus normal. Cervix exhibits no motion tenderness. Right adnexum displays no mass and no tenderness. Left adnexum displays no mass and no tenderness.  Genitourinary Comments: Cervix closed  Neurological: She is alert and oriented to person, place, and time.  Skin: Skin is warm and dry. She is not diaphoretic.  Psychiatric: She has a normal mood and affect. Her behavior is normal. Judgment and thought content normal.    MAU Course  Procedures Results for orders placed or performed during the hospital encounter of 02/08/17 (from the past 24 hour(s))  Urinalysis, Routine w reflex microscopic     Status: Abnormal   Collection Time: 02/08/17 10:00 AM  Result Value Ref Range   Color, Urine YELLOW YELLOW   APPearance HAZY (A) CLEAR   Specific Gravity, Urine 1.016 1.005 - 1.030   pH 6.0 5.0 - 8.0   Glucose, UA NEGATIVE NEGATIVE mg/dL   Hgb urine dipstick NEGATIVE NEGATIVE   Bilirubin Urine NEGATIVE NEGATIVE   Ketones, ur NEGATIVE NEGATIVE mg/dL   Protein,  ur NEGATIVE NEGATIVE mg/dL   Nitrite NEGATIVE NEGATIVE   Leukocytes, UA TRACE (A) NEGATIVE   RBC / HPF 0-5 0 - 5 RBC/hpf   WBC, UA 6-30 0 - 5 WBC/hpf   Bacteria, UA NONE SEEN NONE SEEN   Squamous Epithelial / LPF 0-5 (A) NONE SEEN   Mucous PRESENT   Pregnancy, urine POC     Status: Abnormal   Collection Time: 02/08/17 10:11 AM  Result Value Ref Range   Preg Test, Ur POSITIVE (A) NEGATIVE  CBC     Status: None   Collection Time: 02/08/17 11:30 AM  Result Value Ref Range   WBC 7.3 4.0 - 10.5 K/uL   RBC 4.78 3.87 - 5.11 MIL/uL   Hemoglobin 14.5 12.0 - 15.0 g/dL   HCT 16.1 09.6 - 04.5 %   MCV 87.9 78.0 -  100.0 fL   MCH 30.3 26.0 - 34.0 pg   MCHC 34.5 30.0 - 36.0 g/dL   RDW 40.9 81.1 - 91.4 %   Platelets 351 150 - 400 K/uL  hCG, quantitative, pregnancy     Status: Abnormal   Collection Time: 02/08/17 11:30 AM  Result Value Ref Range   hCG, Beta Chain, Quant, S 487 (H) <5 mIU/mL  Wet prep, genital     Status: Abnormal   Collection Time: 02/08/17 11:35 AM  Result Value Ref Range   Yeast Wet Prep HPF POC NONE SEEN NONE SEEN   Trich, Wet Prep NONE SEEN NONE SEEN   Clue Cells Wet Prep HPF POC NONE SEEN NONE SEEN   WBC, Wet Prep HPF POC MODERATE (A) NONE SEEN   Sperm NONE SEEN    US Ob Comp Less 14 Wks  Result Date: 02/08/2017 CLINICAL DATA:  Lower pelvic pain for 2 days EXAM: OBSTETRIC <14 WK [place] AND TRANSVAGINAL OB US TECHNIQUE: Both transabdominal and transvaginal ultrasound examinations were performed for complete evaluation of the gestation as well as the maternal uterus, adnexal regions, and pelvic cul-de-sac. Transvaginal technique was performed to assess early pregnancy. COMPARISON:  None. FINDINGS: Intrauterine gestational sac: Single Yolk sac:  Not visualize Embryo:  Not visualized Cardiac Activity: N/A Heart Rate:   bpm MSD: 2  mm   4 w   6  d CRL:    mm    w    d                  [place] EDC: Subchorionic hemorrhage:  None visualized. Maternal uterus/adnexae: No adnexal mass or free fluid. IMPRESSION: Early intrauterine pregnancy, 4 weeks 6 days by mean sac diameter. No yolk sac or fetal pole currently. No acute maternal findings. Electronically Signed   By: Charlett Nose M.D.   On: 02/08/2017 12:31   US Ob Transvaginal  Result Date: 02/08/2017 CLINICAL DATA:  Lower pelvic pain for 2 days EXAM: OBSTETRIC <14 WK [place] AND TRANSVAGINAL OB US TECHNIQUE: Both transabdominal and transvaginal ultrasound examinations were performed for complete evaluation of the gestation as well as the maternal uterus, adnexal regions, and pelvic cul-de-sac. Transvaginal technique was performed to assess early pregnancy.  COMPARISON:  None. FINDINGS: Intrauterine gestational sac: Single Yolk sac:  Not visualize Embryo:  Not visualized Cardiac Activity: N/A Heart Rate:   bpm MSD: 2  mm   4 w   6  d CRL:    mm    w    d                  [place]  EDC: Subchorionic hemorrhage:  None visualized. Maternal uterus/adnexae: No adnexal mass or free fluid. IMPRESSION: Early intrauterine pregnancy, 4 weeks 6 days by mean sac diameter. No yolk sac or fetal pole currently. No acute maternal findings. Electronically Signed   By: Charlett Nose M.D.   On: 02/08/2017 12:31    MDM +UPT UA, wet prep, GC/chlamydia, CBC, ABO/Rh, quant hCG, HIV, and [place] today to rule out ectopic pregnancy O positive BHCG 487; ultrasound shows IUGS, no yolk sac, no evidence of ectopic Assessment and Plan  A: 1. Pregnancy of unknown anatomic location   2. Abdominal pain affecting pregnancy    P: Discharge home Go to [facility] Wednesday morning for repeat BHCG Discussed reasons to return to MAU including s/s ectopic GC/CT & urine culture pending  Judeth Horn 02/08/2017, 11:20 AM

## 2017-02-08 NOTE — Discharge Instructions (Signed)
Abdominal Pain During Pregnancy  Abdominal pain is common in pregnancy. Most of the time, it does not cause harm. There are many causes of abdominal pain. Some causes are more serious than others and sometimes the cause is not known. Abdominal pain can be a sign that something is very wrong with the pregnancy or the pain may have nothing to do with the pregnancy. Always tell your health care provider if you have any abdominal pain.  Follow these instructions at home:  · Do not have sex or put anything in your vagina until your symptoms go away completely.  · Watch your abdominal pain for any changes.  · Get plenty of rest until your pain improves.  · Drink enough fluid to keep your urine clear or pale yellow.  · Take over-the-counter or prescription medicines only as told by your health care provider.  · Keep all follow-up visits as told by your health care provider. This is important.  Contact a health care provider if:  · You have a fever.  · Your pain gets worse or you have cramping.  · Your pain continues after resting.  Get help right away if:  · You are bleeding, leaking fluid, or passing tissue from the vagina.  · You have vomiting or diarrhea that does not go away.  · You have painful or bloody urination.  · You notice a decrease in your baby's movements.  · You feel very weak or faint.  · You have shortness of breath.  · You develop a severe headache with abdominal pain.  · You have abnormal vaginal discharge with abdominal pain.  This information is not intended to replace advice given to you by your health care provider. Make sure you discuss any questions you have with your health care provider.  Document Released: 11/23/2005 Document Revised: 09/03/2016 Document Reviewed: 06/22/2013  Elsevier Interactive Patient Education © 2017 [employer]

## 2017-02-09 ENCOUNTER — Other Ambulatory Visit: Payer: Self-pay | Admitting: Advanced Practice Midwife

## 2017-02-09 ENCOUNTER — Telehealth (HOSPITAL_COMMUNITY): Payer: Self-pay | Admitting: *Deleted

## 2017-02-09 DIAGNOSIS — A749 Chlamydial infection, unspecified: Secondary | ICD-10-CM

## 2017-02-09 DIAGNOSIS — O98811 Other maternal infectious and parasitic diseases complicating pregnancy, first trimester: Secondary | ICD-10-CM

## 2017-02-09 HISTORY — DX: Chlamydial infection, unspecified: O98.811

## 2017-02-09 HISTORY — DX: Chlamydial infection, unspecified: A74.9

## 2017-02-09 LAB — CULTURE, OB URINE

## 2017-02-09 LAB — HIV ANTIBODY (ROUTINE TESTING W REFLEX): HIV Screen 4th Generation wRfx: NONREACTIVE

## 2017-02-09 LAB — GC/CHLAMYDIA PROBE AMP (~~LOC~~) NOT AT ARMC
Chlamydia: POSITIVE — AB
Neisseria Gonorrhea: NEGATIVE

## 2017-02-09 MED ORDER — AZITHROMYCIN 500 MG PO TABS: 1000.0000 mg | ORAL_TABLET | Freq: Once | ORAL | 0 refills | Status: AC

## 2017-02-10 ENCOUNTER — Ambulatory Visit: Payer: Self-pay | Admitting: General Practice

## 2017-02-10 ENCOUNTER — Telehealth: Payer: Self-pay | Admitting: General Practice

## 2017-02-10 ENCOUNTER — Encounter: Payer: Self-pay | Admitting: Family Medicine

## 2017-02-10 DIAGNOSIS — Z3490 Encounter for supervision of normal pregnancy, unspecified, unspecified trimester: Secondary | ICD-10-CM

## 2017-02-10 LAB — HCG, QUANTITATIVE, PREGNANCY: hCG, Beta Chain, Quant, S: 989 m[IU]/mL — ABNORMAL HIGH (ref <5)

## 2017-02-10 NOTE — Telephone Encounter (Signed)
Called patient & informed her of bhcg results & ultrasound appt. Patient verbalized understanding & asked how she could get a pregnancy verification letter. Told patient she may come by our office during office hours to pick up that letter. Patient verbalized understanding & had no questions

## 2017-02-10 NOTE — Progress Notes (Signed)
Patient here for stat bhcg today. Patient states pain is gone and she is not having bleeding. Per Sharen CounterLisa Leftwich-Kirby, patient does not need to wait for results- can be contacted by phone. Patient provided number [phone]. Informed patient of + chlamydia. Patient states she is aware and has already received treatment. Patient had no questions. Per Wynelle BourgeoisMarie Williams, patient has had a favorable rise in bhcg levels. Patient needs follow up ultrasound in 2 weeks. Scheduled for 3/19 @ 8am. Will call patient

## 2017-02-11 ENCOUNTER — Encounter: Payer: Self-pay | Admitting: Family Medicine

## 2017-02-22 ENCOUNTER — Ambulatory Visit (HOSPITAL_COMMUNITY): Admission: RE | Admit: 2017-02-22 | Payer: Self-pay | Source: Ambulatory Visit

## 2017-02-22 ENCOUNTER — Telehealth: Payer: Self-pay

## 2017-02-22 ENCOUNTER — Telehealth: Payer: Self-pay | Admitting: *Deleted

## 2017-02-22 NOTE — Telephone Encounter (Signed)
Called to set up new ob appointment. Left voice mail for patient to call office.

## 2017-02-22 NOTE — Telephone Encounter (Signed)
Received voicemail from this am stating need ob less than 14 weeks ordered with ob transvag. Per discussion with Judeth HornErin Lawrence, NP already had ob less than 14 weeks, should only need ob transvag. Called to discuss with Darl PikesSusan and she states since she left message saw that she already had that so only needs transvag now.

## 2017-02-24 ENCOUNTER — Ambulatory Visit: Admission: RE | Admit: 2017-02-24 | Payer: Self-pay | Source: Ambulatory Visit

## 2017-03-01 ENCOUNTER — Ambulatory Visit: Payer: Medicaid Other | Admitting: *Deleted

## 2017-03-01 ENCOUNTER — Ambulatory Visit (HOSPITAL_COMMUNITY)
Admission: RE | Admit: 2017-03-01 | Discharge: 2017-03-01 | Disposition: A | Discharge status: Home / Self Care | Payer: Medicaid Other | Source: Ambulatory Visit | Attending: Advanced Practice Midwife | Admitting: Advanced Practice Midwife

## 2017-03-01 DIAGNOSIS — Z3A01 Less than 8 weeks gestation of pregnancy: Secondary | ICD-10-CM | POA: Diagnosis not present

## 2017-03-01 DIAGNOSIS — O283 Abnormal ultrasonic finding on antenatal screening of mother: Secondary | ICD-10-CM | POA: Insufficient documentation

## 2017-03-01 DIAGNOSIS — Z3490 Encounter for supervision of normal pregnancy, unspecified, unspecified trimester: Secondary | ICD-10-CM

## 2017-03-01 NOTE — Progress Notes (Signed)
Patient came in for results from ultrasound.Results reviewed with patient and new ob appointment with [facility]. Also reviewed meds.

## 2017-03-03 ENCOUNTER — Emergency Department (HOSPITAL_COMMUNITY)
Admission: EM | Admit: 2017-03-03 | Discharge: 2017-03-03 | Disposition: A | Discharge status: Home / Self Care | Payer: Medicaid Other | Attending: Emergency Medicine | Admitting: Emergency Medicine

## 2017-03-03 ENCOUNTER — Encounter (HOSPITAL_COMMUNITY): Payer: Self-pay | Admitting: Emergency Medicine

## 2017-03-03 DIAGNOSIS — Z91040 Latex allergy status: Secondary | ICD-10-CM | POA: Diagnosis not present

## 2017-03-03 DIAGNOSIS — Z3A01 Less than 8 weeks gestation of pregnancy: Secondary | ICD-10-CM | POA: Diagnosis not present

## 2017-03-03 DIAGNOSIS — O200 Threatened abortion: Secondary | ICD-10-CM | POA: Insufficient documentation

## 2017-03-03 DIAGNOSIS — Z87891 Personal history of nicotine dependence: Secondary | ICD-10-CM | POA: Insufficient documentation

## 2017-03-03 DIAGNOSIS — O208 Other hemorrhage in early pregnancy: Secondary | ICD-10-CM | POA: Diagnosis present

## 2017-03-03 LAB — CBC WITH DIFFERENTIAL/PLATELET
Basophils Absolute: 0 10*3/uL (ref 0.0–0.1)
Basophils Relative: 0 %
EOS ABS: 0.1 10*3/uL (ref 0.0–0.7)
EOS PCT: 1 %
HCT: 39.4 % (ref 36.0–46.0)
Hemoglobin: 13.6 g/dL (ref 12.0–15.0)
LYMPHS ABS: 3.5 10*3/uL (ref 0.7–4.0)
Lymphocytes Relative: 47 %
MCH: 30.3 pg (ref 26.0–34.0)
MCHC: 34.5 g/dL (ref 30.0–36.0)
MCV: 87.8 fL (ref 78.0–100.0)
MONO ABS: 0.4 10*3/uL (ref 0.1–1.0)
Monocytes Relative: 5 %
Neutro Abs: 3.6 10*3/uL (ref 1.7–7.7)
Neutrophils Relative %: 47 %
PLATELETS: 334 10*3/uL (ref 150–400)
RBC: 4.49 MIL/uL (ref 3.87–5.11)
RDW: 12.8 % (ref 11.5–15.5)
WBC: 7.5 10*3/uL (ref 4.0–10.5)

## 2017-03-03 LAB — BASIC METABOLIC PANEL
Anion gap: 9 (ref 5–15)
BUN: 6 mg/dL (ref 6–20)
CALCIUM: 9.5 mg/dL (ref 8.9–10.3)
CO2: 22 mmol/L (ref 22–32)
CREATININE: 0.79 mg/dL (ref 0.44–1.00)
Chloride: 104 mmol/L (ref 101–111)
GFR calc Af Amer: >60 mL/min (ref >60)
GLUCOSE: 131 mg/dL — AB (ref 65–99)
POTASSIUM: 3.8 mmol/L (ref 3.5–5.1)
SODIUM: 135 mmol/L (ref 135–145)

## 2017-03-03 LAB — HCG, QUANTITATIVE, PREGNANCY: HCG, BETA CHAIN, QUANT, S: 69431 m[IU]/mL — AB (ref <5)

## 2017-03-03 LAB — WET PREP, GENITAL
CLUE CELLS WET PREP: NONE SEEN
Sperm: NONE SEEN
TRICH WET PREP: NONE SEEN
YEAST WET PREP: NONE SEEN

## 2017-03-03 NOTE — Discharge Instructions (Signed)
Follow up with your ob

## 2017-03-03 NOTE — ED Provider Notes (Signed)
[facility]-EMERGENCY DEPT Provider Note   CSN: [id number] Arrival date & time: 03/03/17  1344   By signing my name below, I, Teofilo Pod, attest that this documentation has been prepared under the direction and in the presence of Melene Plan, DO . Electronically Signed: Teofilo Pod, ED Scribe. 03/03/2017. 4:02 PM.   History   Chief Complaint Chief Complaint  Patient presents with  . Vaginal Bleeding    The history is provided by the patient. No language interpreter was used.   HPI Comments:  Tanya Moreno is a 26 y.o. female who presents to the Emergency Department complaining of persistent vaginal bleeding x 2 days. Pt had 1 episode of vomiting today and lower abdominal pain that is since resolved. Pt states that the bleeding has gotten heavier, and notes that the blood is similar to a period. Pt had a [place] 2 days ago where it was determined that she is [redacted] weeks pregnant, and is G3P2A0. No alleviating factors noted. Pt denies urinary symptoms.   Past Medical History:  Diagnosis Date  . Chlamydia   . Genital HSV    Last outbreak Nov 2013  . Gonorrhea   . Trichomonas     Patient Active Problem List   Diagnosis Date Noted  . Chlamydia infection affecting pregnancy in first trimester 02/09/2017  . Latex allergy 08/26/2011    Past Surgical History:  Procedure Laterality Date  . NO PAST SURGERIES      OB History    Gravida Para Term Preterm AB Living   3 2 2     2    SAB TAB Ectopic Multiple Live Births           2       Home Medications    Prior to Admission medications   Not on File    Family History Family History  Problem Relation Age of Onset  . Heart disease Father   . Kidney disease Maternal Aunt     Social History Social History  Substance Use Topics  . Smoking status: Former Games developer  . Smokeless tobacco: Former Neurosurgeon    Quit date: 11/10/2014  . Alcohol use No     Allergies   Latex   Review of Systems Review of Systems    Gastrointestinal: Positive for nausea and vomiting. Negative for abdominal pain.  Genitourinary: Positive for vaginal bleeding. Negative for dysuria.     Physical Exam Updated Vital Signs BP 129/72 (BP Location: Right Arm)   Pulse 84   Temp 98.9 F (37.2 C) (Oral)   Resp 16   Ht 5\' 4"  (1.626 m)   Wt 250 lb (113.4 kg)   LMP 12/02/2016   SpO2 100%   BMI 42.91 kg/m   Physical Exam  Constitutional: She is oriented to person, place, and time. She appears well-developed and well-nourished. No distress.  HENT:  Head: Normocephalic and atraumatic.  Eyes: EOM are normal. Pupils are equal, round, and reactive to light.  Neck: Normal range of motion. Neck supple.  Cardiovascular: Normal rate and regular rhythm.  Exam reveals no gallop and no friction rub.   No murmur heard. Pulmonary/Chest: Effort normal. She has no wheezes. She has no rales.  Abdominal: Soft. She exhibits no distension. There is no tenderness.  Musculoskeletal: She exhibits no edema or tenderness.  Neurological: She is alert and oriented to person, place, and time.  Skin: Skin is warm and dry. She is not diaphoretic.  Psychiatric: She has a normal mood and  affect. Her behavior is normal.  Nursing note and vitals reviewed.    ED Treatments / Results  DIAGNOSTIC STUDIES:  Oxygen Saturation is 97% on RA, normal by my interpretation.    COORDINATION OF CARE:  3:55 PM Will preform US. Discussed treatment plan with pt at bedside and pt agreed to plan.   Labs (all labs ordered are listed, but only abnormal results are displayed) Labs Reviewed  WET PREP, GENITAL - Abnormal; Notable for the following:       Result Value   WBC, Wet Prep HPF POC MANY (*)    All other components within normal limits  BASIC METABOLIC PANEL - Abnormal; Notable for the following:    Glucose, Bld 131 (*)    All other components within normal limits  HCG, QUANTITATIVE, PREGNANCY - Abnormal; Notable for the following:    hCG, Beta  Chain, Quant, S 69,431 (*)    All other components within normal limits  CBC WITH DIFFERENTIAL/PLATELET  GC/CHLAMYDIA PROBE AMP ([place]) NOT [facility]    EKG  EKG Interpretation None       Radiology No results found.  Procedures Procedures (including critical care time) EMERGENCY DEPARTMENT US PREGNANCY "Study: Limited Ultrasound of the Pelvis"  INDICATIONS:Pregnancy(required) and Vaginal bleeding Multiple views of the uterus and pelvic cavity are obtained with a multi-frequency probe.  APPROACH:Transabdominal   PERFORMED BY: Myself  IMAGES ARCHIVED?: Yes  LIMITATIONS: Body habitus and Decompressed bladder  PREGNANCY FREE FLUID: None  PREGNANCY UTERUS FINDINGS:Uterus enlarged, Gestational sac noted and Non-specific intrauterine fluid noted ADNEXAL FINDINGS:Left ovary not seen and Right ovary not seen  PREGNANCY FINDINGS: Intrauterine gestational sac noted and Fetal heart activity seen  INTERPRETATION: Intrauterine gestational sac noted, Fetal pole present and Fetal heart activity seen  GESTATIONAL AGE, ESTIMATE: 7wk 3 days  Medications Ordered in ED Medications - No data to display   Initial Impression / Assessment and Plan / ED Course  I have reviewed the triage vital signs and the nursing notes.  Pertinent labs & imaging results that were available during my care of the patient were reviewed by me and considered in my medical decision making (see chart for details).     26 yo F With a chief complaint of vaginal bleeding. Patient just had an ultrasound done yesterday that documented she was 7 weeks and 1 day. Patient having some worsening bleeding throughout the day and now it is about the amount should have within normal period. Denies any pain.  Exam with a boggy uterus fingertip dilation. Wet prep negative. OB follow up.   7:21 PM:  I have discussed the diagnosis/risks/treatment options with the patient and believe the pt to be eligible for discharge  home to follow-up with OB. We also discussed returning to the ED immediately if new or worsening sx occur. We discussed the sx which are most concerning (e.g., sudden worsening pain, fever, inability to tolerate by mouth) that necessitate immediate return. Medications administered to the patient during their visit and any new prescriptions provided to the patient are listed below.  Medications given during this visit Medications - No data to display   The patient appears reasonably screen and/or stabilized for discharge and I doubt any other medical condition or other [facility] requiring further screening, evaluation, or treatment in the ED at this time prior to discharge.    Final Clinical Impressions(s) / ED Diagnoses   Final diagnoses:  Threatened miscarriage    New Prescriptions There are no discharge medications for  this patient. I personally performed the services described in this documentation, which was scribed in my presence. The recorded information has been reviewed and is accurate.      Melene Plan, DO 03/03/17 1921

## 2017-03-03 NOTE — ED Notes (Signed)
Pt states she is [redacted] weeks pregnant

## 2017-03-03 NOTE — ED Notes (Signed)
Patient given two ginger ale's to drink

## 2017-03-03 NOTE — ED Triage Notes (Signed)
Pt sts told she was [redacted] weeks pregnant at appointment on Monday now having bleeding; pt G3 P2

## 2017-03-04 LAB — GC/CHLAMYDIA PROBE AMP (~~LOC~~) NOT AT ARMC
Chlamydia: POSITIVE — AB
Neisseria Gonorrhea: NEGATIVE

## 2017-03-08 ENCOUNTER — Encounter: Payer: Self-pay | Admitting: Certified Nurse Midwife

## 2017-03-08 NOTE — Telephone Encounter (Signed)
Refer to telephone call

## 2017-03-24 ENCOUNTER — Encounter: Payer: Self-pay | Admitting: Certified Nurse Midwife

## 2017-03-29 ENCOUNTER — Emergency Department (HOSPITAL_COMMUNITY)
Admission: EM | Admit: 2017-03-29 | Discharge: 2017-03-29 | Disposition: A | Discharge status: Home / Self Care | Payer: Medicaid Other | Attending: Emergency Medicine | Admitting: Emergency Medicine

## 2017-03-29 ENCOUNTER — Encounter (HOSPITAL_COMMUNITY): Payer: Self-pay

## 2017-03-29 DIAGNOSIS — Z91040 Latex allergy status: Secondary | ICD-10-CM | POA: Diagnosis not present

## 2017-03-29 DIAGNOSIS — O26891 Other specified pregnancy related conditions, first trimester: Secondary | ICD-10-CM | POA: Insufficient documentation

## 2017-03-29 DIAGNOSIS — Z3A10 10 weeks gestation of pregnancy: Secondary | ICD-10-CM | POA: Insufficient documentation

## 2017-03-29 DIAGNOSIS — Z87891 Personal history of nicotine dependence: Secondary | ICD-10-CM | POA: Insufficient documentation

## 2017-03-29 DIAGNOSIS — O219 Vomiting of pregnancy, unspecified: Secondary | ICD-10-CM

## 2017-03-29 DIAGNOSIS — R112 Nausea with vomiting, unspecified: Secondary | ICD-10-CM | POA: Insufficient documentation

## 2017-03-29 LAB — URINALYSIS, ROUTINE W REFLEX MICROSCOPIC
Bilirubin Urine: NEGATIVE
GLUCOSE, UA: NEGATIVE mg/dL
Hgb urine dipstick: NEGATIVE
KETONES UR: NEGATIVE mg/dL
Nitrite: NEGATIVE
PH: 7 (ref 5.0–8.0)
Protein, ur: NEGATIVE mg/dL
SPECIFIC GRAVITY, URINE: 1.021 (ref 1.005–1.030)

## 2017-03-29 MED ORDER — ONDANSETRON 4 MG PO TBDP: 4.0000 mg | ORAL_TABLET | Freq: Three times a day (TID) | ORAL | 0 refills | Status: DC | PRN

## 2017-03-29 MED ORDER — KCL IN DEXTROSE-NACL 20-5-0.45 MEQ/L-%-% IV SOLN
Freq: Once | INTRAVENOUS | Status: AC
  Administered 2017-03-29: 10:00:00 via INTRAVENOUS
  Filled 2017-03-29: qty 1000

## 2017-03-29 MED ORDER — ONDANSETRON HCL 4 MG/2ML IJ SOLN
4.0000 mg | Freq: Once | INTRAMUSCULAR | Status: AC
  Administered 2017-03-29: 4 mg via INTRAVENOUS
  Filled 2017-03-29: qty 2

## 2017-03-29 NOTE — ED Triage Notes (Signed)
Pt states she is approximately [redacted] weeks pregnant. She reports having nausea, cannot keep anything down. She also reports intermittent epigastric pain.

## 2017-03-29 NOTE — ED Provider Notes (Signed)
[facility]PT Provider Note   CSN: [id number] Arrival date & time: 03/29/17  8295     History   Chief Complaint Chief Complaint  Patient presents with  . Morning Sickness    HPI Tanya Moreno is a 26 y.o. female.  HPI Patient presents with concern of nausea, vomiting, generalized weakness. Patient is approximately [redacted] weeks pregnant, has been attempting to follow up with OB, but has had difficulty with Medicaid. She now presents with concern of nausea, vomiting for the past 3 days. She notes that she is in tolerant of oral intake, but has no abdominal pain, chest pain, no vaginal bleeding, discharge nor dysuria. No medication taken for relief. This is her third pregnancy, and although she has not seen an obstetrician, she denies complication is per   Past Medical History:  Diagnosis Date  . Chlamydia   . Genital HSV    Last outbreak Nov 2013  . Gonorrhea   . Trichomonas     Patient Active Problem List   Diagnosis Date Noted  . Chlamydia infection affecting pregnancy in first trimester 02/09/2017  . Latex allergy 08/26/2011    Past Surgical History:  Procedure Laterality Date  . NO PAST SURGERIES      OB History    Gravida Para Term Preterm AB Living   SAB TAB Ectopic Multiple Live Births           2       Home Medications    Prior to Admission medications   Not on File    Family History Family History  Problem Relation Age of Onset  . Heart disease Father   . Kidney disease Maternal Aunt     Social History Social History  Substance Use Topics  . Smoking status: Former Games developer  . Smokeless tobacco: Former Neurosurgeon    Quit date: 11/10/2014  . Alcohol use No     Allergies   Latex   Review of Systems Review of Systems  Constitutional:       Per HPI, otherwise negative  HENT:       Per HPI, otherwise negative  Respiratory:       Per HPI, otherwise negative  Cardiovascular:       Per HPI, otherwise negative    Gastrointestinal: Positive for nausea and vomiting.  Endocrine:       Negative aside from HPI  Genitourinary:       Neg aside from HPI   Musculoskeletal:       Per HPI, otherwise negative  Skin: Negative.   Neurological: Positive for weakness. Negative for syncope.     Physical Exam Updated Vital Signs BP 124/77 (BP Location: Left Arm)   Pulse 90   Temp 98.4 F (36.9 C) (Oral)   Resp 19   LMP 12/02/2016   SpO2 98%   Physical Exam  Constitutional: She is oriented to person, place, and time. She appears well-developed and well-nourished. No distress.  HENT:  Head: Normocephalic and atraumatic.  Eyes: Conjunctivae and EOM are normal.  Cardiovascular: Normal rate and regular rhythm.   Pulmonary/Chest: Effort normal and breath sounds normal. No stridor. No respiratory distress.  Abdominal: She exhibits no distension and no mass. There is no tenderness. There is no rebound and no guarding.  Musculoskeletal: She exhibits no edema.  Neurological: She is alert and oriented to person, place, and time. No cranial nerve deficit.  Skin: Skin is warm and dry.  Psychiatric: She has a normal mood and affect.  Nursing note and vitals reviewed.    ED Treatments / Results  Labs (all labs ordered are listed, but only abnormal results are displayed) Labs Reviewed  URINALYSIS, ROUTINE W REFLEX MICROSCOPIC     Procedures Procedures (including critical care time)  Medications Ordered in ED Medications  ondansetron (ZOFRAN) injection 4 mg (not administered)  dextrose 5 % and 0.45 % NaCl with KCl 20 mEq/L infusion (not administered)     Initial Impression / Assessment and Plan / ED Course  I have reviewed the triage vital signs and the nursing notes.  Pertinent labs & imaging results that were available during my care of the patient were reviewed by me and considered in my medical decision making (see chart for details).    [place] from 3wk pta  IMPRESSION: Single live  intrauterine gestation, with estimated gestational age [redacted] weeks 4 days by crown-rump length.   Urinalysis generally reassuring, slight haziness, no ketonuria. Patient has received fluid resuscitation, dextrose 5, half normal saline, with potassium.  On re-check the patient appears better, is tolerating PO intake.   Final Clinical Impressions(s) / ED Diagnoses  Nausea, vomiting, first trimester pregnancy.   Gerhard Munch, MD 03/29/17 1250

## 2017-03-29 NOTE — ED Notes (Signed)
Pt reports she is feeling better; no needs.

## 2017-04-09 ENCOUNTER — Other Ambulatory Visit (HOSPITAL_COMMUNITY)
Admission: RE | Admit: 2017-04-09 | Discharge: 2017-04-09 | Disposition: A | Discharge status: Home / Self Care | Payer: Medicaid Other | Source: Ambulatory Visit | Attending: Obstetrics | Admitting: Obstetrics

## 2017-04-09 ENCOUNTER — Ambulatory Visit (INDEPENDENT_AMBULATORY_CARE_PROVIDER_SITE_OTHER): Payer: Medicaid Other | Admitting: Obstetrics

## 2017-04-09 ENCOUNTER — Encounter: Payer: Self-pay | Admitting: Obstetrics

## 2017-04-09 VITALS — BP 118/80 | HR 78 | Wt 250.2 lb

## 2017-04-09 DIAGNOSIS — Z3481 Encounter for supervision of other normal pregnancy, first trimester: Secondary | ICD-10-CM | POA: Diagnosis not present

## 2017-04-09 DIAGNOSIS — Z3480 Encounter for supervision of other normal pregnancy, unspecified trimester: Secondary | ICD-10-CM | POA: Insufficient documentation

## 2017-04-09 DIAGNOSIS — O219 Vomiting of pregnancy, unspecified: Secondary | ICD-10-CM

## 2017-04-09 DIAGNOSIS — O98811 Other maternal infectious and parasitic diseases complicating pregnancy, first trimester: Secondary | ICD-10-CM

## 2017-04-09 DIAGNOSIS — A749 Chlamydial infection, unspecified: Secondary | ICD-10-CM

## 2017-04-09 MED ORDER — DOXYLAMINE-PYRIDOXINE 10-10 MG PO TBEC: DELAYED_RELEASE_TABLET | ORAL | 5 refills | Status: DC

## 2017-04-09 MED ORDER — CEFIXIME 400 MG PO TABS: 400.0000 mg | ORAL_TABLET | Freq: Every day | ORAL | 0 refills | Status: DC

## 2017-04-09 MED ORDER — DOXYLAMINE-PYRIDOXINE 10-10 MG PO TBEC: 2.0000 | DELAYED_RELEASE_TABLET | Freq: Every day | ORAL | 5 refills | Status: DC

## 2017-04-09 MED ORDER — AZITHROMYCIN 500 MG PO TABS: 1000.0000 mg | ORAL_TABLET | Freq: Once | ORAL | 0 refills | Status: AC

## 2017-04-09 MED ORDER — AZITHROMYCIN 500 MG PO TABS: 1000.0000 mg | ORAL_TABLET | Freq: Once | ORAL | 0 refills | Status: DC

## 2017-04-09 NOTE — Progress Notes (Signed)
Patient is in the office for initial ob visit. Pt is reports feeling occassional pressure, denies pain and bleeding. Pt is requesting rx for nausea.

## 2017-04-09 NOTE — Progress Notes (Signed)
Subjective:    Tanya Moreno is being seen today for her first obstetrical visit.  This is not a planned pregnancy. She is at [redacted]w[redacted]d gestation. Her obstetrical history is significant for obesity. Relationship with FOB: significant other, not living together. Patient does intend to breast feed. Pregnancy history fully reviewed.  The information documented in the HPI was reviewed and verified.  Menstrual History: OB History    Gravida Para Term Preterm AB Living   3 2 2     2    SAB TAB Ectopic Multiple Live Births           2       Patient's last menstrual period was 12/02/2016.    Past Medical History:  Diagnosis Date  . Chlamydia   . Genital HSV    Last outbreak Nov 2013  . Gonorrhea   . Trichomonas     Past Surgical History:  Procedure Laterality Date  . NO PAST SURGERIES       (Not in a hospital admission) Allergies  Allergen Reactions  . Latex Swelling and Other (See Comments)    Mainly latex condoms    Social History  Substance Use Topics  . Smoking status: Former Smoker    Quit date: 2015  . Smokeless tobacco: Former Neurosurgeon    Quit date: 11/10/2014  . Alcohol use No    Family History  Problem Relation Age of Onset  . Heart disease Father   . Kidney disease Maternal Aunt      Review of Systems Constitutional: negative for weight loss Gastrointestinal: negative for vomiting Genitourinary:negative for genital lesions and vaginal discharge and dysuria Musculoskeletal:negative for back pain Behavioral/Psych: negative for abusive relationship, depression, illegal drug usage and tobacco use    Objective:    BP 118/80   Pulse 78   Wt 250 lb 3.2 oz (113.5 kg)   LMP 12/02/2016   BMI 42.95 kg/m  General Appearance:    Alert, cooperative, no distress, appears stated age  Head:    Normocephalic, without obvious abnormality, atraumatic  Eyes:    PERRL, conjunctiva/corneas clear, EOM's intact, fundi    benign, both eyes  Ears:    Normal TM's and external ear  canals, both ears  Nose:   Nares normal, septum midline, mucosa normal, no drainage    or sinus tenderness  Throat:   Lips, mucosa, and tongue normal; teeth and gums normal  Neck:   Supple, symmetrical, trachea midline, no adenopathy;    thyroid:  no enlargement/tenderness/nodules; no carotid   bruit or JVD  Back:     Symmetric, no curvature, ROM normal, no CVA tenderness  Lungs:     Clear to auscultation bilaterally, respirations unlabored  Chest Wall:    No tenderness or deformity   Heart:    Regular rate and rhythm, S1 and S2 normal, no murmur, rub   or gallop  Breast Exam:    No tenderness, masses, or nipple abnormality  Abdomen:     Soft, non-tender, bowel sounds active all four quadrants,    no masses, no organomegaly  Genitalia:    Normal female without lesion, discharge or tenderness  Extremities:   Extremities normal, atraumatic, no cyanosis or edema  Pulses:   2+ and symmetric all extremities  Skin:   Skin color, texture, turgor normal, no rashes or lesions  Lymph nodes:   Cervical, supraclavicular, and axillary nodes normal  Neurologic:   CNII-XII intact, normal strength, sensation and reflexes    throughout  Lab Review Urine pregnancy test Labs reviewed yes Radiologic studies reviewed yes Assessment:    Pregnancy at 1467w1d weeks    Plan:     1. Supervision of other normal pregnancy, antepartum Rx: - Cytology - PAP - Cervicovaginal ancillary only - Culture, OB Urine - Vitamin D (25 hydroxy) - Hemoglobinopathy evaluation - Obstetric Panel, Including HIV - Varicella zoster antibody, IgG  2. Chlamydia infection affecting pregnancy in first trimester Rx: - Azithromycin / Suprax   3. History ofChlamydia infection, untreated   4. Nausea and vomiting in pregnancy prior to [redacted] weeks gestation Rx: - Diclegis  Prenatal vitamins.  Counseling provided regarding continued use of seat belts, cessation of alcohol consumption, smoking or use of illicit drugs;  infection precautions i.e., influenza/TDAP immunizations, toxoplasmosis,CMV, parvovirus, listeria and varicella; workplace safety, exercise during pregnancy; routine dental care, safe medications, sexual activity, hot tubs, saunas, pools, travel, caffeine use, fish and methlymercury, potential toxins, hair treatments, varicose veins Weight gain recommendations per IOM guidelines reviewed: underweight/BMI< 18.5--> gain 28 - 40 lbs; normal weight/BMI 18.5 - 24.9--> gain 25 - 35 lbs; overweight/BMI 25 - 29.9--> gain 15 - 25 lbs; obese/BMI >30->gain  11 - 20 lbs Problem list reviewed and updated. FIRST/CF mutation testing/NIPT/QUAD SCREEN/fragile X/Ashkenazi Jewish population testing/Spinal muscular atrophy discussed: requested. Role of ultrasound in pregnancy discussed; fetal survey: requested. Amniocentesis discussed: not indicated.  Meds ordered this encounter  Medications  . DISCONTD: azithromycin (ZITHROMAX) 500 MG tablet    Sig: Take 2 tablets (1,000 mg total) by mouth once.    Dispense:  2 tablet    Refill:  0  . DISCONTD: cefixime (SUPRAX) 400 MG tablet    Sig: Take 1 tablet (400 mg total) by mouth daily.    Dispense:  1 tablet    Refill:  0  . DISCONTD: Doxylamine-Pyridoxine (DICLEGIS) 10-10 MG TBEC    Sig: 1 tab in AM, 1 tab mid afternoon 2 tabs at bedtime. Max dose 4 tabs daily.    Dispense:  100 tablet    Refill:  5  . azithromycin (ZITHROMAX) 500 MG tablet    Sig: Take 2 tablets (1,000 mg total) by mouth once.    Dispense:  4 tablet    Refill:  0  . cefixime (SUPRAX) 400 MG tablet    Sig: Take 1 tablet (400 mg total) by mouth daily.    Dispense:  2 tablet    Refill:  0  . Doxylamine-Pyridoxine (DICLEGIS) 10-10 MG TBEC    Sig: Take 2 tablets by mouth at bedtime. 1 tab in AM, 1 tab mid afternoon 2 tabs at bedtime. Max dose 4 tabs daily    Dispense:  100 tablet    Refill:  5   Orders Placed This Encounter  Procedures  . Culture, OB Urine  . Vitamin D (25 hydroxy)  .  Hemoglobinopathy evaluation  . Obstetric Panel, Including HIV  . Varicella zoster antibody, IgG    Follow up in 2 weeks. 50% of 20 min visit spent on counseling and coordination of care.

## 2017-04-11 LAB — URINE CULTURE, OB REFLEX

## 2017-04-11 LAB — CULTURE, OB URINE

## 2017-04-12 ENCOUNTER — Other Ambulatory Visit: Payer: Self-pay | Admitting: Obstetrics

## 2017-04-12 DIAGNOSIS — A749 Chlamydial infection, unspecified: Secondary | ICD-10-CM

## 2017-04-12 LAB — CERVICOVAGINAL ANCILLARY ONLY
BACTERIAL VAGINITIS: NEGATIVE
CHLAMYDIA, DNA PROBE: POSITIVE — AB
Candida vaginitis: POSITIVE — AB
NEISSERIA GONORRHEA: NEGATIVE
TRICH (WINDOWPATH): NEGATIVE

## 2017-04-12 LAB — CYTOLOGY - PAP: Diagnosis: NEGATIVE

## 2017-04-13 ENCOUNTER — Other Ambulatory Visit: Payer: Self-pay | Admitting: Obstetrics

## 2017-04-13 ENCOUNTER — Telehealth: Payer: Self-pay

## 2017-04-13 DIAGNOSIS — B373 Candidiasis of vulva and vagina: Secondary | ICD-10-CM

## 2017-04-13 DIAGNOSIS — B3731 Acute candidiasis of vulva and vagina: Secondary | ICD-10-CM

## 2017-04-13 DIAGNOSIS — A749 Chlamydial infection, unspecified: Secondary | ICD-10-CM

## 2017-04-13 LAB — OBSTETRIC PANEL, INCLUDING HIV
Antibody Screen: NEGATIVE
BASOS ABS: 0 10*3/uL (ref 0.0–0.2)
Basos: 0 %
EOS (ABSOLUTE): 0 10*3/uL (ref 0.0–0.4)
Eos: 1 %
HIV Screen 4th Generation wRfx: NONREACTIVE
Hematocrit: 40 % (ref 34.0–46.6)
Hemoglobin: 13.7 g/dL (ref 11.1–15.9)
Hepatitis B Surface Ag: NEGATIVE
IMMATURE GRANS (ABS): 0 10*3/uL (ref 0.0–0.1)
IMMATURE GRANULOCYTES: 0 %
LYMPHS: 46 %
Lymphocytes Absolute: 2.8 10*3/uL (ref 0.7–3.1)
MCH: 30.6 pg (ref 26.6–33.0)
MCHC: 34.3 g/dL (ref 31.5–35.7)
MCV: 89 fL (ref 79–97)
MONOS ABS: 0.4 10*3/uL (ref 0.1–0.9)
Monocytes: 6 %
NEUTROS PCT: 47 %
Neutrophils Absolute: 2.8 10*3/uL (ref 1.4–7.0)
Platelets: 299 10*3/uL (ref 150–379)
RBC: 4.48 x10E6/uL (ref 3.77–5.28)
RDW: 13.5 % (ref 12.3–15.4)
RPR Ser Ql: NONREACTIVE
RUBELLA: 1.62 {index} (ref >0.99)
Rh Factor: POSITIVE
WBC: 6 10*3/uL (ref 3.4–10.8)

## 2017-04-13 LAB — VITAMIN D 25 HYDROXY (VIT D DEFICIENCY, FRACTURES): Vit D, 25-Hydroxy: 12.5 ng/mL — ABNORMAL LOW (ref 30.0–100.0)

## 2017-04-13 LAB — HEMOGLOBINOPATHY EVALUATION
HGB A: 96.9 % (ref 96.4–98.8)
HGB C: 0 %
HGB S: 0 %
HGB VARIANT: 0 %
Hemoglobin A2 Quantitation: 3.1 % (ref 1.8–3.2)
Hemoglobin F Quantitation: 0 % (ref 0.0–2.0)

## 2017-04-13 LAB — VARICELLA ZOSTER ANTIBODY, IGG: Varicella zoster IgG: 1659 index (ref >165)

## 2017-04-13 MED ORDER — AZITHROMYCIN 500 MG PO TABS
1000.0000 mg | ORAL_TABLET | Freq: Once | ORAL | 0 refills | Status: AC
Start: 2017-04-13 — End: 2017-04-13

## 2017-04-13 MED ORDER — AZITHROMYCIN 500 MG PO TABS: 1000.0000 mg | ORAL_TABLET | Freq: Once | ORAL | 0 refills | Status: AC

## 2017-04-13 MED ORDER — CEFIXIME 400 MG PO TABS: 400.0000 mg | ORAL_TABLET | Freq: Once | ORAL | 0 refills | Status: AC

## 2017-04-13 MED ORDER — TERCONAZOLE 0.8 % VA CREA: 1.0000 | TOPICAL_CREAM | Freq: Every day | VAGINAL | 0 refills | Status: DC

## 2017-04-13 NOTE — Telephone Encounter (Signed)
S/w patient about results and medication management. Patient stated that pharmacy told her that they were out of the antibiotics but would have them today by 3:00.

## 2017-04-14 ENCOUNTER — Encounter: Payer: Self-pay | Admitting: Obstetrics and Gynecology

## 2017-04-14 DIAGNOSIS — O99210 Obesity complicating pregnancy, unspecified trimester: Secondary | ICD-10-CM | POA: Insufficient documentation

## 2017-04-14 DIAGNOSIS — Z6841 Body Mass Index (BMI) 40.0 and over, adult: Secondary | ICD-10-CM | POA: Insufficient documentation

## 2017-04-14 DIAGNOSIS — O2641 Herpes gestationis, first trimester: Secondary | ICD-10-CM | POA: Insufficient documentation

## 2017-04-23 ENCOUNTER — Encounter: Payer: Medicaid Other | Admitting: Obstetrics

## 2017-05-02 ENCOUNTER — Encounter (HOSPITAL_COMMUNITY): Payer: Self-pay | Admitting: Certified Nurse Midwife

## 2017-05-02 ENCOUNTER — Inpatient Hospital Stay (HOSPITAL_COMMUNITY)
Admission: AD | Admit: 2017-05-02 | Discharge: 2017-05-02 | Disposition: A | Discharge status: Home / Self Care | Payer: Medicaid Other | Source: Ambulatory Visit | Attending: Obstetrics and Gynecology | Admitting: Obstetrics and Gynecology

## 2017-05-02 DIAGNOSIS — Z3A16 16 weeks gestation of pregnancy: Secondary | ICD-10-CM | POA: Insufficient documentation

## 2017-05-02 DIAGNOSIS — R109 Unspecified abdominal pain: Secondary | ICD-10-CM | POA: Diagnosis not present

## 2017-05-02 DIAGNOSIS — O219 Vomiting of pregnancy, unspecified: Secondary | ICD-10-CM | POA: Diagnosis present

## 2017-05-02 DIAGNOSIS — R197 Diarrhea, unspecified: Secondary | ICD-10-CM | POA: Diagnosis not present

## 2017-05-02 DIAGNOSIS — Z87891 Personal history of nicotine dependence: Secondary | ICD-10-CM | POA: Insufficient documentation

## 2017-05-02 DIAGNOSIS — O9989 Other specified diseases and conditions complicating pregnancy, childbirth and the puerperium: Secondary | ICD-10-CM | POA: Diagnosis not present

## 2017-05-02 DIAGNOSIS — O26892 Other specified pregnancy related conditions, second trimester: Secondary | ICD-10-CM | POA: Diagnosis present

## 2017-05-02 LAB — URINALYSIS, ROUTINE W REFLEX MICROSCOPIC
Bilirubin Urine: NEGATIVE
GLUCOSE, UA: NEGATIVE mg/dL
Hgb urine dipstick: NEGATIVE
KETONES UR: 15 mg/dL — AB
LEUKOCYTES UA: NEGATIVE
NITRITE: NEGATIVE
PH: 6.5 (ref 5.0–8.0)
Protein, ur: NEGATIVE mg/dL
SPECIFIC GRAVITY, URINE: 1.025 (ref 1.005–1.030)

## 2017-05-02 MED ORDER — ONDANSETRON HCL 4 MG/2ML IJ SOLN: 4.0000 mg | Freq: Once | INTRAMUSCULAR | Status: DC

## 2017-05-02 MED ORDER — ONDANSETRON 4 MG PO TBDP: 4.0000 mg | ORAL_TABLET | Freq: Three times a day (TID) | ORAL | 0 refills | Status: DC | PRN

## 2017-05-02 MED ORDER — ONDANSETRON 8 MG PO TBDP
8.0000 mg | ORAL_TABLET | Freq: Once | ORAL | Status: AC
  Administered 2017-05-02: 8 mg via ORAL
  Filled 2017-05-02: qty 1

## 2017-05-02 MED ORDER — DEXTROSE 5 % IN LACTATED RINGERS IV BOLUS: 1000.0000 mL | Freq: Once | INTRAVENOUS | Status: DC

## 2017-05-02 MED ORDER — IBUPROFEN 600 MG PO TABS
600.0000 mg | ORAL_TABLET | Freq: Once | ORAL | Status: AC
  Administered 2017-05-02: 600 mg via ORAL
  Filled 2017-05-02: qty 1

## 2017-05-02 MED ORDER — FAMOTIDINE IN NACL 20-0.9 MG/50ML-% IV SOLN: 20.0000 mg | Freq: Once | INTRAVENOUS | Status: DC

## 2017-05-02 MED ORDER — LACTATED RINGERS IV BOLUS (SEPSIS): 1000.0000 mL | Freq: Once | INTRAVENOUS | Status: DC

## 2017-05-02 NOTE — MAU Note (Signed)
Patient presents to MAU with c/o abdominal pain associated with N/V and diarrhea. Woke up out of sleep around 0400 with pain. 1 occurrence of diarrhea and vomited x2 this morning. Denies LOF or vaginal bleeding. Dr Clearance CootsHarper primary for care.

## 2017-05-02 NOTE — MAU Provider Note (Signed)
History     CSN: [id number]  Arrival date and time: 05/02/17 0454   First Provider Initiated Contact with Patient 05/02/17 [phone]      Chief Complaint  Patient presents with  . Abdominal Pain  . Nausea  . Diarrhea  . Emesis   HPI   Ms.Tanya Moreno is a 26 y.o. female 453P2002 @ 7383w3d here via EMS with abdomianlpain that started yesterday around 11pm. The pain is sharp and cramping. The pain is located in the lower part of her stomach. The pain comes and goes. No history of preterm delivery. Shortly after the pain started she started experiencing N/V/D. She has had 2 episodes of liquid diarrhea, and 2 episodes of vomiting. Abdominal pain has improved since diarrhea. Denies vaginal bleeding.  No sick contacts.   OB History    Gravida Para Term Preterm AB Living   3 2 2     2    SAB TAB Ectopic Multiple Live Births           2      Past Medical History:  Diagnosis Date  . Chlamydia   . Genital HSV    Last outbreak Nov 2013  . Gonorrhea   . Trichomonas     Past Surgical History:  Procedure Laterality Date  . NO PAST SURGERIES      Family History  Problem Relation Age of Onset  . Heart disease Father   . Kidney disease Maternal Aunt     Social History  Substance Use Topics  . Smoking status: Former Smoker    Quit date: 2015  . Smokeless tobacco: Former NeurosurgeonUser    Quit date: 11/10/2014  . Alcohol use No    Allergies:  Allergies  Allergen Reactions  . Latex Swelling and Other (See Comments)    Mainly latex condoms    Prescriptions Prior to Admission  Medication Sig Dispense Refill Last Dose  . Doxylamine-Pyridoxine (DICLEGIS) 10-10 MG TBEC Take 2 tablets by mouth at bedtime. 1 tab in AM, 1 tab mid afternoon 2 tabs at bedtime. Max dose 4 tabs daily 100 tablet 5 05/02/2017 at Unknown time  . ondansetron (ZOFRAN ODT) 4 MG disintegrating tablet Take 1 tablet (4 mg total) by mouth every 8 (eight) hours as needed for nausea or vomiting. (Patient not taking: Reported  on 04/09/2017) 20 tablet 0 Not Taking  . terconazole (TERAZOL 3) 0.8 % vaginal cream Place 1 applicator vaginally at bedtime. 20 g 0    Results for orders placed or performed during the hospital encounter of 05/02/17 (from the past 48 hour(s))  Urinalysis, Routine w reflex microscopic     Status: Abnormal   Collection Time: 05/02/17  5:30 AM  Result Value Ref Range   Color, Urine YELLOW YELLOW   APPearance CLEAR CLEAR   Specific Gravity, Urine 1.025 1.005 - 1.030   pH 6.5 5.0 - 8.0   Glucose, UA NEGATIVE NEGATIVE mg/dL   Hgb urine dipstick NEGATIVE NEGATIVE   Bilirubin Urine NEGATIVE NEGATIVE   Ketones, ur 15 (A) NEGATIVE mg/dL   Protein, ur NEGATIVE NEGATIVE mg/dL   Nitrite NEGATIVE NEGATIVE   Leukocytes, UA NEGATIVE NEGATIVE    Comment: Microscopic not done on urines with negative protein, blood, leukocytes, nitrite, or glucose < 500 mg/dL.   Review of Systems  Constitutional: Negative for fever.  Gastrointestinal: Positive for diarrhea, nausea and vomiting.  Genitourinary: Negative for vaginal bleeding.   Physical Exam   Blood pressure 140/78, pulse 87, temperature 98.5 F (36.9  C), temperature source Oral, resp. rate 16, height 5\' 4"  (1.626 m), weight 218 lb 12 oz (99.2 kg), last menstrual period 12/02/2016.  Physical Exam  Constitutional: She is oriented to person, place, and time. She appears well-developed and well-nourished. No distress.  HENT:  Head: Normocephalic.  GI: Soft. She exhibits no distension. There is no tenderness. There is no rebound.  Genitourinary:  Genitourinary Comments: Dilation: Closed Effacement (%): Thick Exam by:: J Amelianna Meller NP  Musculoskeletal: Normal range of motion.  Neurological: She is alert and oriented to person, place, and time.  Skin: Skin is warm. She is not diaphoretic.  Psychiatric: Her behavior is normal.    MAU Course  Procedures  None  MDM  + fetal heart tones via doppler  Zofran ODT Ibuprofen 600 mg PO  Patient tolerating  PO fluids   Assessment and Plan   A:  1. Abdominal pain in pregnancy, second trimester   2. Diarrhea, unspecified type   3. Nausea and vomiting in pregnancy     P:  Discharge home in stable condition Rx: Zofran Return to MAU if symptoms worsen  Ok to use imodium as directed on the bottle [employer]    Venia Carbon I, NP 05/02/2017 11:30 AM

## 2017-05-05 ENCOUNTER — Encounter: Payer: Medicaid Other | Admitting: Obstetrics

## 2017-05-13 ENCOUNTER — Encounter: Payer: Medicaid Other | Admitting: Obstetrics

## 2017-05-24 ENCOUNTER — Encounter (HOSPITAL_COMMUNITY): Payer: Self-pay

## 2017-05-24 ENCOUNTER — Emergency Department (HOSPITAL_COMMUNITY)
Admission: EM | Admit: 2017-05-24 | Discharge: 2017-05-24 | Disposition: A | Discharge status: Home / Self Care | Payer: Medicaid Other | Attending: Emergency Medicine | Admitting: Emergency Medicine

## 2017-05-24 DIAGNOSIS — O368120 Decreased fetal movements, second trimester, not applicable or unspecified: Secondary | ICD-10-CM | POA: Diagnosis present

## 2017-05-24 DIAGNOSIS — Z79899 Other long term (current) drug therapy: Secondary | ICD-10-CM | POA: Insufficient documentation

## 2017-05-24 DIAGNOSIS — Z87891 Personal history of nicotine dependence: Secondary | ICD-10-CM | POA: Diagnosis not present

## 2017-05-24 DIAGNOSIS — Z3A19 19 weeks gestation of pregnancy: Secondary | ICD-10-CM | POA: Insufficient documentation

## 2017-05-24 DIAGNOSIS — Z91040 Latex allergy status: Secondary | ICD-10-CM | POA: Insufficient documentation

## 2017-05-24 LAB — CBC WITH DIFFERENTIAL/PLATELET
BASOS ABS: 0 10*3/uL (ref 0.0–0.1)
BASOS PCT: 0 %
EOS ABS: 0.1 10*3/uL (ref 0.0–0.7)
Eosinophils Relative: 1 %
HCT: 40.1 % (ref 36.0–46.0)
Hemoglobin: 13.6 g/dL (ref 12.0–15.0)
Lymphocytes Relative: 36 %
Lymphs Abs: 2.5 10*3/uL (ref 0.7–4.0)
MCH: 30.4 pg (ref 26.0–34.0)
MCHC: 33.9 g/dL (ref 30.0–36.0)
MCV: 89.5 fL (ref 78.0–100.0)
Monocytes Absolute: 0.6 10*3/uL (ref 0.1–1.0)
Monocytes Relative: 9 %
NEUTROS PCT: 54 %
Neutro Abs: 3.8 10*3/uL (ref 1.7–7.7)
Platelets: 260 10*3/uL (ref 150–400)
RBC: 4.48 MIL/uL (ref 3.87–5.11)
RDW: 12.9 % (ref 11.5–15.5)
WBC: 7.1 10*3/uL (ref 4.0–10.5)

## 2017-05-24 LAB — URINALYSIS, ROUTINE W REFLEX MICROSCOPIC
Bilirubin Urine: NEGATIVE
Glucose, UA: 50 mg/dL — AB
Hgb urine dipstick: NEGATIVE
Ketones, ur: 20 mg/dL — AB
LEUKOCYTES UA: NEGATIVE
NITRITE: NEGATIVE
Protein, ur: NEGATIVE mg/dL
SPECIFIC GRAVITY, URINE: 1.015 (ref 1.005–1.030)
pH: 7 (ref 5.0–8.0)

## 2017-05-24 LAB — COMPREHENSIVE METABOLIC PANEL
ALT: 19 U/L (ref 14–54)
AST: 22 U/L (ref 15–41)
Albumin: 3.5 g/dL (ref 3.5–5.0)
Alkaline Phosphatase: 52 U/L (ref 38–126)
Anion gap: 9 (ref 5–15)
BUN: <5 mg/dL — ABNORMAL LOW (ref 6–20)
CALCIUM: 9.4 mg/dL (ref 8.9–10.3)
CHLORIDE: 106 mmol/L (ref 101–111)
CO2: 19 mmol/L — ABNORMAL LOW (ref 22–32)
CREATININE: 0.55 mg/dL (ref 0.44–1.00)
Glucose, Bld: 103 mg/dL — ABNORMAL HIGH (ref 65–99)
Potassium: 3.5 mmol/L (ref 3.5–5.1)
Sodium: 134 mmol/L — ABNORMAL LOW (ref 135–145)
Total Bilirubin: 0.3 mg/dL (ref 0.3–1.2)
Total Protein: 6.8 g/dL (ref 6.5–8.1)

## 2017-05-24 MED ORDER — SODIUM CHLORIDE 0.9 % IV BOLUS (SEPSIS)
1000.0000 mL | Freq: Once | INTRAVENOUS | Status: AC
  Administered 2017-05-24: 1000 mL via INTRAVENOUS

## 2017-05-24 NOTE — ED Notes (Addendum)
Pt aware urine specimen needed but unable to urinate at this time. Bolus of fluid going at this time.

## 2017-05-24 NOTE — ED Notes (Signed)
No fetal heart tones noted with small doppler. EDP at bedside performing ultrasound.

## 2017-05-24 NOTE — ED Triage Notes (Signed)
Patient here to have fetus checked. States that she is [redacted] weeks pregnant and hasn't felt fetal movement since last night. Denies any abdominal pain, no bleeding, no fluid leakage. Denies trauma. G3, P2. Alert and oriented, VSS

## 2017-05-24 NOTE — ED Notes (Signed)
Pt denies N/V/D/Fever.  Denies pain/contractions.  Patient only wanting to make sure baby is ok since decrease in fetal movement today.

## 2017-05-24 NOTE — ED Provider Notes (Signed)
Received care from Dr. Pecola Leisureeese at [facility]. Briefly, this is a 26yo female at [facility] who presented with concern for decreased fetal movement. Urinalysis pending.  Urinalysis shows no UTI.  Last blood pressure recorded as 83/61 is inaccurate, other blood pressures noted on monitor minutes later and while I was in room 110 to 120 systolic, patient asymptomati.  She feels fetal movement at this time. Recommend continued follow up with OBGYN. Patient discharged in stable condition with understanding of reasons to return.    Alvira MondaySchlossman, Leonel Mccollum, MD 05/26/17 1321

## 2017-05-24 NOTE — ED Notes (Signed)
Pt given graham crackers and ginger ale ?

## 2017-05-24 NOTE — ED Provider Notes (Signed)
[facility]-EMERGENCY DEPT Provider Note   CSN: [id number] Arrival date & time: 05/24/17  1224     History   Chief Complaint No chief complaint on file.   HPI Tanya Moreno is a 26 y.o. female.  The history is provided by the patient. No language interpreter was used.   Tanya Moreno is a 26 y.o. female who presents to the Emergency Department complaining of decreased fetal movement.  She is 19 weeks and 4 days pregnant G3 P2 here for evaluation of decreased fetal movement. She has been in her retained state of health with frequent nausea but no other complaints. Today she noticed that the baby is not moving as much. She does feel a little bit of movement but decreased compared to prior. No abdominal pain, back pain, fevers, vomiting, dysuria, vaginal discharge, vaginal bleeding, leakage of fluids. Past Medical History:  Diagnosis Date  . Chlamydia   . Genital HSV    Last outbreak Nov 2013  . Gonorrhea   . Trichomonas     Patient Active Problem List   Diagnosis Date Noted  . History of HSV 04/14/2017  . BMI 40.0-44.9, adult (HCC) 04/14/2017  . Obesity in pregnancy, antepartum 04/14/2017  . Supervision of other normal pregnancy, antepartum 04/09/2017  . Chlamydia infection affecting pregnancy in first trimester 02/09/2017  . Latex allergy 08/26/2011    Past Surgical History:  Procedure Laterality Date  . NO PAST SURGERIES      OB History    Gravida Para Term Preterm AB Living   3 2 2     2    SAB TAB Ectopic Multiple Live Births           2       Home Medications    Prior to Admission medications   Medication Sig Start Date End Date Taking? Authorizing Provider  Doxylamine-Pyridoxine (DICLEGIS) 10-10 MG TBEC Take 2 tablets by mouth at bedtime. 1 tab in AM, 1 tab mid afternoon 2 tabs at bedtime. Max dose 4 tabs daily Patient taking differently: Take 1-2 tablets by mouth See admin instructions. 1 tablet in the morning then 1 tablet mid-afternoon then 2 tablets at  bedtime with a MAX of 4 tablets/day 04/09/17  Yes Brock Bad, MD  ondansetron (ZOFRAN ODT) 4 MG disintegrating tablet Take 1 tablet (4 mg total) by mouth every 8 (eight) hours as needed for nausea or vomiting. Patient not taking: Reported on 05/24/2017 05/02/17   Rasch, Victorino Dike I, NP  terconazole (TERAZOL 3) 0.8 % vaginal cream Place 1 applicator vaginally at bedtime. Patient not taking: Reported on 05/24/2017 04/13/17   Brock Bad, MD    Family History Family History  Problem Relation Age of Onset  . Heart disease Father   . Kidney disease Maternal Aunt     Social History Social History  Substance Use Topics  . Smoking status: Former Smoker    Quit date: 2015  . Smokeless tobacco: Former Neurosurgeon    Quit date: 11/10/2014  . Alcohol use No     Allergies   Latex and Tape   Review of Systems Review of Systems  All other systems reviewed and are negative.    Physical Exam Updated Vital Signs BP 112/63   Pulse 92   Temp 98.2 F (36.8 C) (Oral)   Resp 18   LMP 12/02/2016   SpO2 100%   Physical Exam  Constitutional: She is oriented to person, place, and time. She appears well-developed and well-nourished.  HENT:  Head: Normocephalic and atraumatic.  Cardiovascular: Normal rate and regular rhythm.   No murmur heard. Pulmonary/Chest: Effort normal and breath sounds normal. No respiratory distress.  Abdominal:  Gravid abdomen and abdominal obesity with no tenderness.  Musculoskeletal: She exhibits no edema or tenderness.  Neurological: She is alert and oriented to person, place, and time.  Skin: Skin is warm and dry.  Psychiatric: She has a normal mood and affect. Her behavior is normal.  Nursing note and vitals reviewed.    ED Treatments / Results  Labs (all labs ordered are listed, but only abnormal results are displayed) Labs Reviewed  COMPREHENSIVE METABOLIC PANEL  CBC WITH DIFFERENTIAL/PLATELET  URINALYSIS, ROUTINE W REFLEX MICROSCOPIC    EKG   EKG Interpretation None       Radiology No results found.  Procedures Procedures (including critical care time)  Medications Ordered in ED Medications  sodium chloride 0.9 % bolus 1,000 mL (1,000 mLs Intravenous New Bag/Given 05/24/17 1550)     Initial Impression / Assessment and Plan / ED Course  I have reviewed the triage vital signs and the nursing notes.  Pertinent labs & imaging results that were available during my care of the patient were reviewed by me and considered in my medical decision making (see chart for details).    Bedside fetal heart tones in the 140s to 150s with fetal activity.  Patient is 19 weeks and 4 days pregnant here for evaluation of decreased fetal movement. Fetal heart rate obtained and reassuring. During her ED stay she began to feel more fetal movement. She has no abdominal tenderness or contractions. Offered patient reassurance with plan to DC home with outpatient follow-up. Patient care transferred pending urinalysis.  Final Clinical Impressions(s) / ED Diagnoses   Final diagnoses:  None    New Prescriptions New Prescriptions   No medications on file     Tilden Fossaees, Felishia Wartman, MD 05/25/17 [phone]

## 2017-05-25 ENCOUNTER — Encounter: Payer: Medicaid Other | Admitting: Obstetrics

## 2017-05-27 ENCOUNTER — Ambulatory Visit (INDEPENDENT_AMBULATORY_CARE_PROVIDER_SITE_OTHER): Payer: Medicaid Other | Admitting: Obstetrics & Gynecology

## 2017-05-27 VITALS — BP 118/74 | HR 83 | Wt 251.0 lb

## 2017-05-27 DIAGNOSIS — Z3482 Encounter for supervision of other normal pregnancy, second trimester: Secondary | ICD-10-CM

## 2017-05-27 DIAGNOSIS — Z3480 Encounter for supervision of other normal pregnancy, unspecified trimester: Secondary | ICD-10-CM

## 2017-05-27 NOTE — Progress Notes (Signed)
Missed last appt   PRENATAL VISIT NOTE  Subjective:  Tanya Moreno is a 26 y.o. G3P2002 at 8919w0d being seen today for ongoing prenatal care.  She is currently monitored for the following issues for this low-risk pregnancy and has Latex allergy; Chlamydia infection affecting pregnancy in first trimester; Supervision of other normal pregnancy, antepartum; History of HSV; BMI 40.0-44.9, adult (HCC); and Obesity in pregnancy, antepartum on her problem list.  Patient reports no complaints.  Contractions: Not present. Vag. Bleeding: None.  Movement: Present. Denies leaking of fluid.   The following portions of the patient's history were reviewed and updated as appropriate: allergies, current medications, past family history, past medical history, past social history, past surgical history and problem list. Problem list updated.  Objective:   Vitals:   05/27/17 1513  BP: 118/74  Pulse: 83  Weight: 251 lb (113.9 kg)    Fetal Status: Fetal Heart Rate (bpm): 140   Movement: Present     General:  Alert, oriented and cooperative. Patient is in no acute distress.  Skin: Skin is warm and dry. No rash noted.   Cardiovascular: Normal heart rate noted  Respiratory: Normal respiratory effort, no problems with respiration noted  Abdomen: Soft, gravid, appropriate for gestational age. Pain/Pressure: Absent     Pelvic:  Cervical exam deferred        Extremities: Normal range of motion.  Edema: None  Mental Status: Normal mood and affect. Normal behavior. Normal judgment and thought content.   Assessment and Plan:  Pregnancy: G3P2002 at 6719w0d  1. Supervision of other normal pregnancy, antepartum Needs TOC today - AFP TETRA - [facility] - US MFM OB COMP + 14 WK; Future  Preterm labor symptoms and general obstetric precautions including but not limited to vaginal bleeding, contractions, leaking of fluid and fetal movement were reviewed in detail with the  patient. Please refer to After Visit Summary for other counseling recommendations.  4 weeks  Scheryl DarterJames Anjalee Cope, MD

## 2017-05-27 NOTE — Patient Instructions (Signed)

## 2017-05-31 ENCOUNTER — Other Ambulatory Visit: Payer: Self-pay | Admitting: Obstetrics & Gynecology

## 2017-05-31 ENCOUNTER — Ambulatory Visit (HOSPITAL_COMMUNITY)
Admission: RE | Admit: 2017-05-31 | Discharge: 2017-05-31 | Disposition: A | Discharge status: Home / Self Care | Payer: Medicaid Other | Source: Ambulatory Visit | Attending: Obstetrics & Gynecology | Admitting: Obstetrics & Gynecology

## 2017-05-31 DIAGNOSIS — Z3689 Encounter for other specified antenatal screening: Secondary | ICD-10-CM | POA: Diagnosis not present

## 2017-05-31 DIAGNOSIS — O99212 Obesity complicating pregnancy, second trimester: Secondary | ICD-10-CM

## 2017-05-31 DIAGNOSIS — Z3A20 20 weeks gestation of pregnancy: Secondary | ICD-10-CM

## 2017-05-31 DIAGNOSIS — Z3480 Encounter for supervision of other normal pregnancy, unspecified trimester: Secondary | ICD-10-CM

## 2017-06-01 LAB — AFP TETRA
DIA Value (EIA): 183.39 pg/mL
Gestational Age: 20 WEEKS
MATERNAL AGE AT EDD: 27.7 a
MSAFP: 45 ng/mL
MSHCG: 9367 m[IU]/mL
uE3 Value: 2.51 ng/mL

## 2017-06-24 ENCOUNTER — Encounter: Payer: Medicaid Other | Admitting: Obstetrics & Gynecology

## 2017-07-05 ENCOUNTER — Other Ambulatory Visit (HOSPITAL_COMMUNITY)
Admission: RE | Admit: 2017-07-05 | Discharge: 2017-07-05 | Disposition: A | Discharge status: Home / Self Care | Payer: Medicaid Other | Source: Ambulatory Visit | Attending: Obstetrics and Gynecology | Admitting: Obstetrics and Gynecology

## 2017-07-05 ENCOUNTER — Ambulatory Visit (INDEPENDENT_AMBULATORY_CARE_PROVIDER_SITE_OTHER): Payer: Medicaid Other | Admitting: Obstetrics and Gynecology

## 2017-07-05 VITALS — BP 116/66 | HR 82 | Wt 252.0 lb

## 2017-07-05 DIAGNOSIS — O98812 Other maternal infectious and parasitic diseases complicating pregnancy, second trimester: Secondary | ICD-10-CM

## 2017-07-05 DIAGNOSIS — A749 Chlamydial infection, unspecified: Secondary | ICD-10-CM | POA: Insufficient documentation

## 2017-07-05 DIAGNOSIS — Z3480 Encounter for supervision of other normal pregnancy, unspecified trimester: Secondary | ICD-10-CM

## 2017-07-05 DIAGNOSIS — B9689 Other specified bacterial agents as the cause of diseases classified elsewhere: Secondary | ICD-10-CM | POA: Diagnosis not present

## 2017-07-05 DIAGNOSIS — Z3A25 25 weeks gestation of pregnancy: Secondary | ICD-10-CM | POA: Insufficient documentation

## 2017-07-05 DIAGNOSIS — O98811 Other maternal infectious and parasitic diseases complicating pregnancy, first trimester: Secondary | ICD-10-CM

## 2017-07-05 DIAGNOSIS — O99212 Obesity complicating pregnancy, second trimester: Secondary | ICD-10-CM

## 2017-07-05 DIAGNOSIS — O99210 Obesity complicating pregnancy, unspecified trimester: Secondary | ICD-10-CM

## 2017-07-05 DIAGNOSIS — Z3482 Encounter for supervision of other normal pregnancy, second trimester: Secondary | ICD-10-CM

## 2017-07-05 MED ORDER — COMFORT FIT MATERNITY SUPP MED MISC: 0 refills | Status: DC

## 2017-07-05 NOTE — Progress Notes (Signed)
   PRENATAL VISIT NOTE  Subjective:  [name] is a 26 y.o. G3P2002 at 6862w4d being seen today for ongoing prenatal care.  She is currently monitored for the following issues for this low-risk pregnancy and has Latex allergy; Chlamydia infection affecting pregnancy in first trimester; Supervision of other normal pregnancy, antepartum; History of HSV; BMI 40.0-44.9, adult (HCC); and Obesity in pregnancy, antepartum on her problem list.  Patient reports no complaints.  Contractions: Not present.  .  Movement: Present. Denies leaking of fluid.   The following portions of the patient's history were reviewed and updated as appropriate: allergies, current medications, past family history, past medical history, past social history, past surgical history and problem list. Problem list updated.  Objective:   Vitals:   07/05/17 0838  BP: 116/66  Pulse: 82  Weight: 252 lb (114.3 kg)    Fetal Status: Fetal Heart Rate (bpm): 135 Fundal Height: 26 cm Movement: Present     General:  Alert, oriented and cooperative. Patient is in no acute distress.  Skin: Skin is warm and dry. No rash noted.   Cardiovascular: Normal heart rate noted  Respiratory: Normal respiratory effort, no problems with respiration noted  Abdomen: Soft, gravid, appropriate for gestational age.  Pain/Pressure: Absent     Pelvic: Cervical exam deferred        Extremities: Normal range of motion.     Mental Status:  Normal mood and affect. Normal behavior. Normal judgment and thought content.   Assessment and Plan:  Pregnancy: G3P2002 at 462w4d  1. Supervision of other normal pregnancy, antepartum Patient is doing well without complaints Third trimester labs next visit with glucola and Tdap  2. Obesity in pregnancy, antepartum Reviewed recommended weight gain during pregnancy and advised patient to choose healthier options and to exercise daily (minimum of a 30 minute walk)  3. Chlamydia infection affecting pregnancy in  first trimester Positive in May TOC today - Urine cytology ancillary only  Preterm labor symptoms and general obstetric precautions including but not limited to vaginal bleeding, contractions, leaking of fluid and fetal movement were reviewed in detail with the patient. Please refer to After Visit Summary for other counseling recommendations.  Return in about 3 weeks (around 07/26/2017) for ROB, 2 hr glucola next visit.   Tanya AntiguaPeggy Annis Lagoy, MD

## 2017-07-07 ENCOUNTER — Encounter (HOSPITAL_COMMUNITY): Payer: Self-pay | Admitting: Emergency Medicine

## 2017-07-07 ENCOUNTER — Emergency Department (HOSPITAL_COMMUNITY)
Admission: EM | Admit: 2017-07-07 | Discharge: 2017-07-07 | Disposition: A | Discharge status: Home / Self Care | Payer: Medicaid Other | Attending: Emergency Medicine | Admitting: Emergency Medicine

## 2017-07-07 DIAGNOSIS — R102 Pelvic and perineal pain: Secondary | ICD-10-CM

## 2017-07-07 DIAGNOSIS — O9989 Other specified diseases and conditions complicating pregnancy, childbirth and the puerperium: Secondary | ICD-10-CM | POA: Insufficient documentation

## 2017-07-07 DIAGNOSIS — O26893 Other specified pregnancy related conditions, third trimester: Secondary | ICD-10-CM

## 2017-07-07 DIAGNOSIS — Z87891 Personal history of nicotine dependence: Secondary | ICD-10-CM | POA: Insufficient documentation

## 2017-07-07 DIAGNOSIS — Z3A25 25 weeks gestation of pregnancy: Secondary | ICD-10-CM | POA: Insufficient documentation

## 2017-07-07 DIAGNOSIS — O26899 Other specified pregnancy related conditions, unspecified trimester: Secondary | ICD-10-CM

## 2017-07-07 DIAGNOSIS — R1030 Lower abdominal pain, unspecified: Secondary | ICD-10-CM | POA: Diagnosis present

## 2017-07-07 DIAGNOSIS — Z91040 Latex allergy status: Secondary | ICD-10-CM | POA: Diagnosis not present

## 2017-07-07 DIAGNOSIS — R109 Unspecified abdominal pain: Secondary | ICD-10-CM

## 2017-07-07 LAB — URINE CYTOLOGY ANCILLARY ONLY
Chlamydia: POSITIVE — AB
Neisseria Gonorrhea: NEGATIVE

## 2017-07-07 LAB — URINALYSIS, ROUTINE W REFLEX MICROSCOPIC
Bilirubin Urine: NEGATIVE
Glucose, UA: NEGATIVE mg/dL
KETONES UR: NEGATIVE mg/dL
NITRITE: NEGATIVE
PH: 7 (ref 5.0–8.0)
Protein, ur: 30 mg/dL — AB
Specific Gravity, Urine: 1.018 (ref 1.005–1.030)

## 2017-07-07 LAB — CBC
HCT: 36.5 % (ref 36.0–46.0)
Hemoglobin: 12.7 g/dL (ref 12.0–15.0)
MCH: 30.8 pg (ref 26.0–34.0)
MCHC: 34.8 g/dL (ref 30.0–36.0)
MCV: 88.6 fL (ref 78.0–100.0)
Platelets: 244 10*3/uL (ref 150–400)
RBC: 4.12 MIL/uL (ref 3.87–5.11)
RDW: 13 % (ref 11.5–15.5)
WBC: 6.9 10*3/uL (ref 4.0–10.5)

## 2017-07-07 LAB — COMPREHENSIVE METABOLIC PANEL
ALK PHOS: 63 U/L (ref 38–126)
ALT: 13 U/L — AB (ref 14–54)
AST: 16 U/L (ref 15–41)
Albumin: 3.2 g/dL — ABNORMAL LOW (ref 3.5–5.0)
Anion gap: 7 (ref 5–15)
BUN: <5 mg/dL — ABNORMAL LOW (ref 6–20)
CALCIUM: 9.4 mg/dL (ref 8.9–10.3)
CO2: 23 mmol/L (ref 22–32)
CREATININE: 0.55 mg/dL (ref 0.44–1.00)
Chloride: 106 mmol/L (ref 101–111)
Glucose, Bld: 70 mg/dL (ref 65–99)
Potassium: 3.5 mmol/L (ref 3.5–5.1)
Sodium: 136 mmol/L (ref 135–145)
TOTAL PROTEIN: 6.6 g/dL (ref 6.5–8.1)
Total Bilirubin: 0.4 mg/dL (ref 0.3–1.2)

## 2017-07-07 LAB — LIPASE, BLOOD: Lipase: 27 U/L (ref 11–51)

## 2017-07-07 LAB — POC URINE PREG, ED: Preg Test, Ur: POSITIVE — AB

## 2017-07-07 NOTE — Discharge Instructions (Signed)
Wear your pregnancy girdle and take tylenol as needed. Follow up with Dr. Clearance CootsHarper.

## 2017-07-07 NOTE — ED Triage Notes (Addendum)
States she is 6 months pregnant ( 25 weeks )and she is having lower abd pain, denies n/v/d/sob or vag d/c , states it is worse at night  G 3 P 2 A 0 L2

## 2017-07-07 NOTE — H&P (Signed)
Dr Despina HiddenEure notified that pt presented to [facility] today at 25.6 wk complaining of lower abdominal pain x2 days.  Pt reports that she told Dr Clearance CootsHarper of this and he informed her of round ligament pain and gave her an abdominal girdle.  Pt reports that this seemed to help throughout the day yesterday but that she was in severe pain last night after removing the girdle.  Dr Despina HiddenEure notified that fhr is reassuring and no uc traced.  MD says to perform NST and encourage pt to wear girdle at night and that she may be cleared obstetrically after NST.

## 2017-07-07 NOTE — ED Triage Notes (Signed)
Rapid response RN at Pt bed side

## 2017-07-07 NOTE — ED Provider Notes (Signed)
[facility]-EMERGENCY DEPT Provider Note   CSN: [id number] Arrival date & time: 07/07/17  1556 By signing my name below, I, Bridgette HabermannMaria Tan, attest that this documentation has been prepared under the direction and in the presence of Kerrie BuffaloHope Naureen Benton, [place]. Electronically Signed: Bridgette HabermannMaria Tan, ED Scribe. 07/07/17. 5:39 PM.  History   Chief Complaint Chief Complaint  Patient presents with  . Abdominal Pain    HPI The history is provided by the patient. No language interpreter was used.   HPI Comments: Tanya Moreno is a 26 y.o. [id number] @ 9325w6d, LMP 12/02/16, who presents to the Emergency Department complaining of lower abdominal pain beginning several days ago. Pt reports pain is worse at night. She has been to her OB, Dr. Clearance CootsHarper, and given a pregnancy belt that helps during the day when she wears it but she has pain at night. She does not wear the belt at night. [facility] her ride took her here. No OTC medications PTA. Pt denies fever, chills, nausea, vomiting, back pain, dysuria, urinary frequency/urgency, or any other associated symptoms.  Past Medical History:  Diagnosis Date  . Chlamydia   . Genital HSV    Last outbreak Nov 2013  . Gonorrhea   . Trichomonas     Patient Active Problem List   Diagnosis Date Noted  . History of HSV 04/14/2017  . BMI 40.0-44.9, adult (HCC) 04/14/2017  . Obesity in pregnancy, antepartum 04/14/2017  . Supervision of other normal pregnancy, antepartum 04/09/2017  . Chlamydia infection affecting pregnancy in first trimester 02/09/2017  . Latex allergy 08/26/2011    Past Surgical History:  Procedure Laterality Date  . NO PAST SURGERIES      OB History    Gravida Para Term Preterm AB Living   3 2 2     2    SAB TAB Ectopic Multiple Live Births           2       Home Medications    Prior to Admission medications   Medication Sig Start Date End Date Taking? Authorizing Provider  Doxylamine-Pyridoxine (DICLEGIS) 10-10 MG TBEC Take  2 tablets by mouth at bedtime. 1 tab in AM, 1 tab mid afternoon 2 tabs at bedtime. Max dose 4 tabs daily Patient taking differently: Take 1-2 tablets by mouth See admin instructions. 1 tablet in the morning then 1 tablet mid-afternoon then 2 tablets at bedtime with a MAX of 4 tablets/day 04/09/17   Brock BadHarper, Charles A, MD  Elastic Bandages & Supports (COMFORT FIT MATERNITY SUPP MED) MISC Wear daily when ambulating 07/05/17   Constant, Peggy, MD    Family History Family History  Problem Relation Age of Onset  . Heart disease Father   . Kidney disease Maternal Aunt     Social History Social History  Substance Use Topics  . Smoking status: Former Smoker    Quit date: 2015  . Smokeless tobacco: Former NeurosurgeonUser    Quit date: 11/10/2014  . Alcohol use No     Allergies   Latex and Tape   Review of Systems Review of Systems  Constitutional: Negative for chills.  HENT: Negative.   Eyes: Negative for visual disturbance.  Gastrointestinal: Positive for abdominal pain. Negative for nausea and vomiting.  Genitourinary: Negative for dysuria, frequency and urgency.       [redacted] weeks gestation  Musculoskeletal: Positive for back pain (occasional).  Skin: Negative for rash.  Neurological: Negative for headaches.   Physical Exam Updated  Vital Signs BP 121/77 (BP Location: Left Arm)   Pulse 83   Temp 98.3 F (36.8 C) (Oral)   Resp 16   LMP 12/02/2016   SpO2 100%   Physical Exam  Constitutional: She appears well-developed and well-nourished. No distress.  HENT:  Head: Normocephalic.  Eyes: Conjunctivae and EOM are normal.  Neck: Neck supple.  Cardiovascular: Normal rate and regular rhythm.   Pulmonary/Chest: Effort normal. No respiratory distress.  Abdominal: Bowel sounds are normal. There is no tenderness.  Gravid @ 3031w6d   Musculoskeletal: Normal range of motion.  Neurological: She is alert.  Skin: Skin is warm and dry.  Psychiatric: She has a normal mood and affect. Her behavior is  normal.  Nursing note and vitals reviewed.    ED Treatments / Results  DIAGNOSTIC STUDIES: EFM applied by rapid OB nurse Oxygen Saturation is 100% on RA, normal by my interpretation.   COORDINATION OF CARE: 5:39 PM-Discussed next steps with pt. Pt verbalized understanding and is agreeable with the plan.   Labs (all labs ordered are listed, but only abnormal results are displayed) Labs Reviewed  COMPREHENSIVE METABOLIC PANEL - Abnormal; Notable for the following:       Result Value   BUN <5 (*)    Albumin 3.2 (*)    ALT 13 (*)    All other components within normal limits  URINALYSIS, ROUTINE W REFLEX MICROSCOPIC - Abnormal; Notable for the following:    Hgb urine dipstick MODERATE (*)    Protein, ur 30 (*)    Leukocytes, UA LARGE (*)    Bacteria, UA FEW (*)    Squamous Epithelial / LPF 0-5 (*)    All other components within normal limits  POC URINE PREG, ED - Abnormal; Notable for the following:    Preg Test, Ur POSITIVE (*)    All other components within normal limits  URINE CULTURE  LIPASE, BLOOD  CBC    Radiology No results found.  Procedures Procedures (including critical care time)  Medications Ordered in ED Medications - No data to display   Initial Impression / Assessment and Plan / ED Course  I have reviewed the triage vital signs and the nursing notes. 5:45 PM consult with rapid OB nurse and she will come to evaluate the patient.  6:00 pm Rapid OB here to evaluate the patient.  See rapid OB note.   I Consulted with Dr. Despina HiddenEure, on for OB. Discussed urine results. Urine sent for culture will await results before starting any antibiotic since patient has no UTI symptoms.   Final Clinical Impressions(s) / ED Diagnoses  26 y.o. [id number] @[redacted]w[redacted]d  stable for d/c after evaluation by me and by the rapid OB nurse and consult with Dr. Despina HiddenEure. Patient not contracting and monitor tracing reassuring. Patient to wear her pregnancy belt prescribed by Dr. Clearance CootsHarper and f/u in  the office. She will take tylenol as needed for pain.  Final diagnoses:  Abdominal pain in pregnancy, third trimester  Pain of round ligament affecting pregnancy, antepartum   New Prescriptions New Prescriptions   No medications on file  I personally performed the services described in this documentation, which was scribed in my presence. The recorded information has been reviewed and is accurate.    Kerrie Buffaloeese, Hisashi Amadon [place], [place] 07/07/17 Sable [id number]    Arby BarrettePfeiffer, Marcy, MD 07/08/17 [phone]

## 2017-07-08 ENCOUNTER — Telehealth: Payer: Self-pay

## 2017-07-08 ENCOUNTER — Encounter: Payer: Self-pay | Admitting: Obstetrics and Gynecology

## 2017-07-08 ENCOUNTER — Other Ambulatory Visit: Payer: Self-pay | Admitting: Obstetrics and Gynecology

## 2017-07-08 MED ORDER — AZITHROMYCIN 500 MG PO TABS: 1000.0000 mg | ORAL_TABLET | Freq: Once | ORAL | 1 refills | Status: AC

## 2017-07-08 NOTE — Telephone Encounter (Signed)
-----   Message from Catalina AntiguaPeggy Constant, MD sent at 07/08/2017  8:28 AM EDT ----- Please inform patient of positive chlamydia infection. She has been positive since May 2018. Her antibiotics has been e-prescribed. It is extremely important for her to take this one time treatment. It is even more important for her to inform her partner in order for him to get treated and to abstain until they have both completed treatment.  Peggy

## 2017-07-08 NOTE — Telephone Encounter (Signed)
Pt informed of results, and that rx has been sent to the pharmacy

## 2017-07-08 NOTE — Progress Notes (Signed)
Patient complaining of lower back and hip discomfort. Rx maternity belt provided. Patient advised to exercise regularly and stretch

## 2017-07-09 LAB — URINE CULTURE

## 2017-07-15 ENCOUNTER — Telehealth: Payer: Self-pay | Admitting: *Deleted

## 2017-07-15 ENCOUNTER — Other Ambulatory Visit: Payer: Self-pay | Admitting: *Deleted

## 2017-07-15 ENCOUNTER — Encounter: Payer: Self-pay | Admitting: *Deleted

## 2017-07-15 MED ORDER — VITAFOL GUMMIES 3.33-0.333-34.8 MG PO CHEW: 3.0000 | CHEWABLE_TABLET | Freq: Every day | ORAL | 5 refills | Status: DC

## 2017-07-15 NOTE — Telephone Encounter (Signed)
Pt called requesting that a Vitafol Gummies prescription for prenatal vitamins be called into her pharmacy,please advise.Marland Kitchen..Marland Kitchen

## 2017-07-15 NOTE — Progress Notes (Signed)
pnv requested by pt Rx sent

## 2017-07-15 NOTE — Telephone Encounter (Signed)
PNV has been sent

## 2017-07-16 ENCOUNTER — Other Ambulatory Visit: Payer: Self-pay | Admitting: Obstetrics

## 2017-07-16 DIAGNOSIS — Z3480 Encounter for supervision of other normal pregnancy, unspecified trimester: Secondary | ICD-10-CM

## 2017-07-16 MED ORDER — VITAFOL GUMMIES 3.33-0.333-34.8 MG PO CHEW: 3.0000 | CHEWABLE_TABLET | Freq: Every day | ORAL | 11 refills | Status: DC

## 2017-07-16 NOTE — Telephone Encounter (Signed)
Vitafol Gummies Rx 

## 2017-07-26 ENCOUNTER — Ambulatory Visit (INDEPENDENT_AMBULATORY_CARE_PROVIDER_SITE_OTHER): Payer: Medicaid Other | Admitting: Obstetrics and Gynecology

## 2017-07-26 ENCOUNTER — Other Ambulatory Visit: Payer: Medicaid Other

## 2017-07-26 VITALS — BP 137/82 | HR 94 | Wt 253.0 lb

## 2017-07-26 DIAGNOSIS — Z3482 Encounter for supervision of other normal pregnancy, second trimester: Secondary | ICD-10-CM

## 2017-07-26 DIAGNOSIS — O2641 Herpes gestationis, first trimester: Secondary | ICD-10-CM

## 2017-07-26 DIAGNOSIS — O99210 Obesity complicating pregnancy, unspecified trimester: Secondary | ICD-10-CM

## 2017-07-26 DIAGNOSIS — Z3480 Encounter for supervision of other normal pregnancy, unspecified trimester: Secondary | ICD-10-CM

## 2017-07-26 DIAGNOSIS — O99212 Obesity complicating pregnancy, second trimester: Secondary | ICD-10-CM

## 2017-07-26 DIAGNOSIS — O98811 Other maternal infectious and parasitic diseases complicating pregnancy, first trimester: Secondary | ICD-10-CM

## 2017-07-26 DIAGNOSIS — A749 Chlamydial infection, unspecified: Secondary | ICD-10-CM

## 2017-07-26 NOTE — Progress Notes (Signed)
   PRENATAL VISIT NOTE  Subjective:  Tanya Moreno is a 26 y.o. G3P2002 at [redacted]w[redacted]d being seen today for ongoing prenatal care.  She is currently monitored for the following issues for this low-risk pregnancy and has Latex allergy; Chlamydia infection affecting pregnancy in first trimester; Supervision of other normal pregnancy, antepartum; History of HSV; BMI 40.0-44.9, adult (HCC); and Obesity in pregnancy, antepartum on her problem list.  Patient reports no complaints.  Contractions: Not present. Vag. Bleeding: None.  Movement: Present. Denies leaking of fluid.   The following portions of the patient's history were reviewed and updated as appropriate: allergies, current medications, past family history, past medical history, past social history, past surgical history and problem list. Problem list updated.  Objective:   Vitals:   07/26/17 1009  BP: 137/82  Pulse: 94  Weight: 253 lb (114.8 kg)    Fetal Status: Fetal Heart Rate (bpm): 146 Fundal Height: 30 cm Movement: Present     General:  Alert, oriented and cooperative. Patient is in no acute distress.  Skin: Skin is warm and dry. No rash noted.   Cardiovascular: Normal heart rate noted  Respiratory: Normal respiratory effort, no problems with respiration noted  Abdomen: Soft, gravid, appropriate for gestational age.  Pain/Pressure: Absent     Pelvic: Cervical exam deferred        Extremities: Normal range of motion.  Edema: None  Mental Status:  Normal mood and affect. Normal behavior. Normal judgment and thought content.   Assessment and Plan:  Pregnancy: G3P2002 at [redacted]w[redacted]d  1. Supervision of other normal pregnancy, antepartum Patient is doing well without complaints Third trimester labs, glucola and tdap today Patient desires BTL- consent signed today - Glucose Tolerance, 2 Hours w/1 Hour - CBC - HIV antibody - RPR  2. Chlamydia infection affecting pregnancy in first trimester Test of cure next visit  3. Obesity in  pregnancy, antepartum Discussed excessive weight gain   4. History of HSV Will start prophylaxis at 34 weeks  Preterm labor symptoms and general obstetric precautions including but not limited to vaginal bleeding, contractions, leaking of fluid and fetal movement were reviewed in detail with the patient. Please refer to After Visit Summary for other counseling recommendations.  Return in about 2 weeks (around 08/09/2017) for ROB.   Catalina Antigua, MD

## 2017-07-27 LAB — CBC
Hematocrit: 33.8 % — ABNORMAL LOW (ref 34.0–46.6)
Hemoglobin: 11.8 g/dL (ref 11.1–15.9)
MCH: 30.4 pg (ref 26.6–33.0)
MCHC: 34.9 g/dL (ref 31.5–35.7)
MCV: 87 fL (ref 79–97)
PLATELETS: 233 10*3/uL (ref 150–379)
RBC: 3.88 x10E6/uL (ref 3.77–5.28)
RDW: 12.9 % (ref 12.3–15.4)
WBC: 6.5 10*3/uL (ref 3.4–10.8)

## 2017-07-27 LAB — GLUCOSE TOLERANCE, 2 HOURS W/ 1HR
GLUCOSE, 1 HOUR: 146 mg/dL (ref 65–179)
GLUCOSE, 2 HOUR: 128 mg/dL (ref 65–152)
GLUCOSE, FASTING: 82 mg/dL (ref 65–91)

## 2017-07-27 LAB — HIV ANTIBODY (ROUTINE TESTING W REFLEX): HIV SCREEN 4TH GENERATION: NONREACTIVE

## 2017-07-27 LAB — RPR: RPR: NONREACTIVE

## 2017-07-28 ENCOUNTER — Encounter (HOSPITAL_COMMUNITY): Payer: Self-pay

## 2017-07-28 ENCOUNTER — Emergency Department (HOSPITAL_COMMUNITY)
Admission: EM | Admit: 2017-07-28 | Discharge: 2017-07-28 | Disposition: A | Discharge status: Home / Self Care | Payer: Medicaid Other | Attending: Emergency Medicine | Admitting: Emergency Medicine

## 2017-07-28 DIAGNOSIS — R200 Anesthesia of skin: Secondary | ICD-10-CM | POA: Insufficient documentation

## 2017-07-28 DIAGNOSIS — Z5321 Procedure and treatment not carried out due to patient leaving prior to being seen by health care provider: Secondary | ICD-10-CM | POA: Insufficient documentation

## 2017-07-28 NOTE — ED Notes (Signed)
Patient called for room with no answer.  

## 2017-07-28 NOTE — ED Triage Notes (Addendum)
This Clinical research associate  Called Pt name in front lobby and Pt did not answer.  Checked triage for PT but Pt was not present in Triage.

## 2017-07-28 NOTE — ED Notes (Signed)
OB Rapid Response made aware of pt. Tanya Moreno states if EDP feels pt should be evaluated she can come but at this time no need to monitor pt. Fetal heart tones should be obtained.

## 2017-07-28 NOTE — ED Triage Notes (Signed)
Pt presents for evaluation of L arm numbness and tingling x 1 week. Reports tingling comes and goes.  States saw PCP on Monday but didn't have anything done for numbness. Pt reports [redacted] weeks pregnant. No focal neuro symptoms.

## 2017-07-28 NOTE — ED Notes (Signed)
Pt called for vital recheck with no answer 

## 2017-08-10 ENCOUNTER — Ambulatory Visit (INDEPENDENT_AMBULATORY_CARE_PROVIDER_SITE_OTHER): Payer: Medicaid Other | Admitting: Obstetrics & Gynecology

## 2017-08-10 DIAGNOSIS — Z3483 Encounter for supervision of other normal pregnancy, third trimester: Secondary | ICD-10-CM

## 2017-08-10 DIAGNOSIS — Z3480 Encounter for supervision of other normal pregnancy, unspecified trimester: Secondary | ICD-10-CM

## 2017-08-10 NOTE — Patient Instructions (Signed)
Third Trimester of Pregnancy The third trimester is from week 28 through week 40 (months 7 through 9). The third trimester is a time when the unborn baby (fetus) is growing rapidly. At the end of the ninth month, the fetus is about 20 inches in length and weighs 6-10 pounds. Body changes during your third trimester Your body will continue to go through many changes during pregnancy. The changes vary from woman to woman. During the third trimester:  Your weight will continue to increase. You can expect to gain 25-35 pounds (11-16 kg) by the end of the pregnancy.  You may begin to get stretch marks on your hips, abdomen, and breasts.  You may urinate more often because the fetus is moving lower into your pelvis and pressing on your bladder.  You may develop or continue to have heartburn. This is caused by increased hormones that slow down muscles in the digestive tract.  You may develop or continue to have constipation because increased hormones slow digestion and cause the muscles that push waste through your intestines to relax.  You may develop hemorrhoids. These are swollen veins (varicose veins) in the rectum that can itch or be painful.  You may develop swollen, bulging veins (varicose veins) in your legs.  You may have increased body aches in the pelvis, back, or thighs. This is due to weight gain and increased hormones that are relaxing your joints.  You may have changes in your hair. These can include thickening of your hair, rapid growth, and changes in texture. Some women also have hair loss during or after pregnancy, or hair that feels dry or thin. Your hair will most likely return to normal after your baby is born.  Your breasts will continue to grow and they will continue to become tender. A yellow fluid (colostrum) may leak from your breasts. This is the first milk you are producing for your baby.  Your belly button may stick out.  You may notice more swelling in your hands,  face, or ankles.  You may have increased tingling or numbness in your hands, arms, and legs. The skin on your belly may also feel numb.  You may feel short of breath because of your expanding uterus.  You may have more problems sleeping. This can be caused by the size of your belly, increased need to urinate, and an increase in your body's metabolism.  You may notice the fetus "dropping," or moving lower in your abdomen (lightening).  You may have increased vaginal discharge.  You may notice your joints feel loose and you may have pain around your pelvic bone.  What to expect at prenatal visits You will have prenatal exams every 2 weeks until week 36. Then you will have weekly prenatal exams. During a routine prenatal visit:  You will be weighed to make sure you and the baby are growing normally.  Your blood pressure will be taken.  Your abdomen will be measured to track your baby's growth.  The fetal heartbeat will be listened to.  Any test results from the previous visit will be discussed.  You may have a cervical check near your due date to see if your cervix has softened or thinned (effaced).  You will be tested for Group B streptococcus. This happens between 35 and 37 weeks.  Your health care provider may ask you:  What your birth plan is.  How you are feeling.  If you are feeling the baby move.  If you have had   any abnormal symptoms, such as leaking fluid, bleeding, severe headaches, or abdominal cramping.  If you are using any tobacco products, including cigarettes, chewing tobacco, and electronic cigarettes.  If you have any questions.  Other tests or screenings that may be performed during your third trimester include:  Blood tests that check for low iron levels (anemia).  Fetal testing to check the health, activity level, and growth of the fetus. Testing is done if you have certain medical conditions or if there are problems during the  pregnancy.  Nonstress test (NST). This test checks the health of your baby to make sure there are no signs of problems, such as the baby not getting enough oxygen. During this test, a belt is placed around your belly. The baby is made to move, and its heart rate is monitored during movement.  What is false labor? False labor is a condition in which you feel small, irregular tightenings of the muscles in the womb (contractions) that usually go away with rest, changing position, or drinking water. These are called Braxton Hicks contractions. Contractions may last for hours, days, or even weeks before true labor sets in. If contractions come at regular intervals, become more frequent, increase in intensity, or become painful, you should see your health care provider. What are the signs of labor?  Abdominal cramps.  Regular contractions that start at 10 minutes apart and become stronger and more frequent with time.  Contractions that start on the top of the uterus and spread down to the lower abdomen and back.  Increased pelvic pressure and dull back pain.  A watery or bloody mucus discharge that comes from the vagina.  Leaking of amniotic fluid. This is also known as your "water breaking." It could be a slow trickle or a gush. Let your health care provider know if it has a color or strange odor. If you have any of these signs, call your health care provider right away, even if it is before your due date. Follow these instructions at home: Medicines  Follow your health care provider's instructions regarding medicine use. Specific medicines may be either safe or unsafe to take during pregnancy.  Take a prenatal vitamin that contains at least 600 micrograms (mcg) of folic acid.  If you develop constipation, try taking a stool softener if your health care provider approves. Eating and drinking  Eat a balanced diet that includes fresh fruits and vegetables, whole grains, good sources of protein  such as meat, eggs, or tofu, and low-fat dairy. Your health care provider will help you determine the amount of weight gain that is right for you.  Avoid raw meat and uncooked cheese. These carry germs that can cause birth defects in the baby.  If you have low calcium intake from food, talk to your health care provider about whether you should take a daily calcium supplement.  Eat four or five small meals rather than three large meals a day.  Limit foods that are high in fat and processed sugars, such as fried and sweet foods.  To prevent constipation: ? Drink enough fluid to keep your urine clear or pale yellow. ? Eat foods that are high in fiber, such as fresh fruits and vegetables, whole grains, and beans. Activity  Exercise only as directed by your health care provider. Most women can continue their usual exercise routine during pregnancy. Try to exercise for 30 minutes at least 5 days a week. Stop exercising if you experience uterine contractions.  Avoid heavy   lifting.  Do not exercise in extreme heat or humidity, or at high altitudes.  Wear low-heel, comfortable shoes.  Practice good posture.  You may continue to have sex unless your health care provider tells you otherwise. Relieving pain and discomfort  Take frequent breaks and rest with your legs elevated if you have leg cramps or low back pain.  Take warm sitz baths to soothe any pain or discomfort caused by hemorrhoids. Use hemorrhoid cream if your health care provider approves.  Wear a good support bra to prevent discomfort from breast tenderness.  If you develop varicose veins: ? Wear support pantyhose or compression stockings as told by your healthcare provider. ? Elevate your feet for 15 minutes, 3-4 times a day. Prenatal care  Write down your questions. Take them to your prenatal visits.  Keep all your prenatal visits as told by your health care provider. This is important. Safety  Wear your seat belt at  all times when driving.  Make a list of emergency phone numbers, including numbers for family, friends, the hospital, and police and fire departments. General instructions  Avoid cat litter boxes and soil used by cats. These carry germs that can cause birth defects in the baby. If you have a cat, ask someone to clean the litter box for you.  Do not travel far distances unless it is absolutely necessary and only with the approval of your health care provider.  Do not use hot tubs, steam rooms, or saunas.  Do not drink alcohol.  Do not use any products that contain nicotine or tobacco, such as cigarettes and e-cigarettes. If you need help quitting, ask your health care provider.  Do not use any medicinal herbs or unprescribed drugs. These chemicals affect the formation and growth of the baby.  Do not douche or use tampons or scented sanitary pads.  Do not cross your legs for long periods of time.  To prepare for the arrival of your baby: ? Take prenatal classes to understand, practice, and ask questions about labor and delivery. ? Make a trial run to the hospital. ? Visit the hospital and tour the maternity area. ? Arrange for maternity or paternity leave through employers. ? Arrange for family and friends to take care of pets while you are in the hospital. ? Purchase a rear-facing car seat and make sure you know how to install it in your car. ? Pack your hospital bag. ? Prepare the baby's nursery. Make sure to remove all pillows and stuffed animals from the baby's crib to prevent suffocation.  Visit your dentist if you have not gone during your pregnancy. Use a soft toothbrush to brush your teeth and be gentle when you floss. Contact a health care provider if:  You are unsure if you are in labor or if your water has broken.  You become dizzy.  You have mild pelvic cramps, pelvic pressure, or nagging pain in your abdominal area.  You have lower back pain.  You have persistent  nausea, vomiting, or diarrhea.  You have an unusual or bad smelling vaginal discharge.  You have pain when you urinate. Get help right away if:  Your water breaks before 37 weeks.  You have regular contractions less than 5 minutes apart before 37 weeks.  You have a fever.  You are leaking fluid from your vagina.  You have spotting or bleeding from your vagina.  You have severe abdominal pain or cramping.  You have rapid weight loss or weight gain.    You have shortness of breath with chest pain.  You notice sudden or extreme swelling of your face, hands, ankles, feet, or legs.  Your baby makes fewer than 10 movements in 2 hours.  You have severe headaches that do not go away when you take medicine.  You have vision changes. Summary  The third trimester is from week 28 through week 40, months 7 through 9. The third trimester is a time when the unborn baby (fetus) is growing rapidly.  During the third trimester, your discomfort may increase as you and your baby continue to gain weight. You may have abdominal, leg, and back pain, sleeping problems, and an increased need to urinate.  During the third trimester your breasts will keep growing and they will continue to become tender. A yellow fluid (colostrum) may leak from your breasts. This is the first milk you are producing for your baby.  False labor is a condition in which you feel small, irregular tightenings of the muscles in the womb (contractions) that eventually go away. These are called Braxton Hicks contractions. Contractions may last for hours, days, or even weeks before true labor sets in.  Signs of labor can include: abdominal cramps; regular contractions that start at 10 minutes apart and become stronger and more frequent with time; watery or bloody mucus discharge that comes from the vagina; increased pelvic pressure and dull back pain; and leaking of amniotic fluid. This information is not intended to replace advice  given to you by your health care provider. Make sure you discuss any questions you have with your health care provider. Document Released: 11/17/2001 Document Revised: 04/30/2016 Document Reviewed: 01/24/2013 [employer] Interactive Patient Education  2017 [employer]  

## 2017-08-10 NOTE — Progress Notes (Signed)
   PRENATAL VISIT NOTE  Subjective:  Tanya Moreno is a 26 y.o. G3P2002 at 2713w5d being seen today for ongoing prenatal care.  She is currently monitored for the following issues for this low-risk pregnancy and has Latex allergy; Chlamydia infection affecting pregnancy in first trimester; Supervision of other normal pregnancy, antepartum; History of HSV; BMI 40.0-44.9, adult (HCC); and Obesity in pregnancy, antepartum on her problem list.  Patient reports no complaints.   . Vag. Bleeding: None.  Movement: Present. Denies leaking of fluid.   The following portions of the patient's history were reviewed and updated as appropriate: allergies, current medications, past family history, past medical history, past social history, past surgical history and problem list. Problem list updated.  Objective:   Vitals:   08/10/17 1403  BP: 128/83  Pulse: (!) 102  Weight: 257 lb (116.6 kg)    Fetal Status: Fetal Heart Rate (bpm): 131 Fundal Height: 32 cm Movement: Present     General:  Alert, oriented and cooperative. Patient is in no acute distress.  Skin: Skin is warm and dry. No rash noted.   Cardiovascular: Normal heart rate noted  Respiratory: Normal respiratory effort, no problems with respiration noted  Abdomen: Soft, gravid, appropriate for gestational age.  Pain/Pressure: Present     Pelvic: Cervical exam deferred        Extremities: Normal range of motion.  Edema: Trace  Mental Status:  Normal mood and affect. Normal behavior. Normal judgment and thought content.   Assessment and Plan:  Pregnancy: G3P2002 at 2713w5d  1. Supervision of other normal pregnancy, antepartum   Preterm labor symptoms and general obstetric precautions including but not limited to vaginal bleeding, contractions, leaking of fluid and fetal movement were reviewed in detail with the patient. Please refer to After Visit Summary for other counseling recommendations.  Return in about 2 weeks (around  08/24/2017).   Scheryl DarterJames Arnold, MD

## 2017-08-24 ENCOUNTER — Ambulatory Visit (INDEPENDENT_AMBULATORY_CARE_PROVIDER_SITE_OTHER): Payer: Medicaid Other | Admitting: Obstetrics & Gynecology

## 2017-08-24 VITALS — BP 117/76 | HR 90 | Wt 252.0 lb

## 2017-08-24 DIAGNOSIS — O26899 Other specified pregnancy related conditions, unspecified trimester: Secondary | ICD-10-CM

## 2017-08-24 DIAGNOSIS — Z3480 Encounter for supervision of other normal pregnancy, unspecified trimester: Secondary | ICD-10-CM

## 2017-08-24 DIAGNOSIS — G5600 Carpal tunnel syndrome, unspecified upper limb: Secondary | ICD-10-CM | POA: Insufficient documentation

## 2017-08-24 MED ORDER — WRIST SPLINT/COCK-UP/RIGHT L MISC: 1.0000 | Freq: Every day | 0 refills | Status: DC

## 2017-08-24 MED ORDER — WRIST SPLINT/COCK-UP/LEFT L MISC: 1.0000 | Freq: Every day | 0 refills | Status: DC

## 2017-08-24 NOTE — Patient Instructions (Signed)

## 2017-08-24 NOTE — Progress Notes (Signed)
Pt complains of having numbness in her hands

## 2017-08-24 NOTE — Progress Notes (Signed)
   PRENATAL VISIT NOTE  Subjective:  Tanya Moreno is a 26 y.o. G3P2002 at [redacted]w[redacted]d being seen today for ongoing prenatal care.  She is currently monitored for the following issues for this low-risk pregnancy and has Latex allergy; Chlamydia infection affecting pregnancy in first trimester; Supervision of other normal pregnancy, antepartum; History of HSV; BMI 40.0-44.9, adult (HCC); Obesity in pregnancy, antepartum; and Carpal tunnel syndrome during pregnancy on her problem list.  Patient reports bilateral hand numbness.  Contractions: Not present. Vag. Bleeding: None.  Movement: Present. Denies leaking of fluid.   The following portions of the patient's history were reviewed and updated as appropriate: allergies, current medications, past family history, past medical history, past social history, past surgical history and problem list. Problem list updated.  Objective:   Vitals:   08/24/17 1404  BP: 117/76  Pulse: 90  Weight: 252 lb (114.3 kg)    Fetal Status: Fetal Heart Rate (bpm): 145 Fundal Height: 34 cm Movement: Present     General:  Alert, oriented and cooperative. Patient is in no acute distress.  Skin: Skin is warm and dry. No rash noted.   Cardiovascular: Normal heart rate noted  Respiratory: Normal respiratory effort, no problems with respiration noted  Abdomen: Soft, gravid, appropriate for gestational age.  Pain/Pressure: Present     Pelvic: Cervical exam deferred        Extremities: Normal range of motion.  Edema: None  Mental Status:  Normal mood and affect. Normal behavior. Normal judgment and thought content.   Assessment and Plan:  Pregnancy: G3P2002 at [redacted]w[redacted]d  1. Carpal tunnel syndrome during pregnancy Sx c/w CTS - Elastic Bandages & Supports (WRIST SPLINT/COCK-UP/RIGHT L) MISC; 1 Device by Does not apply route daily.  Dispense: 1 each; Refill: 0 - Elastic Bandages & Supports (WRIST SPLINT/COCK-UP/LEFT L) MISC; 1 Device by Does not apply route daily.  Dispense:  1 each; Refill: 0  2. Supervision of other normal pregnancy, antepartum   Preterm labor symptoms and general obstetric precautions including but not limited to vaginal bleeding, contractions, leaking of fluid and fetal movement were reviewed in detail with the patient. Please refer to After Visit Summary for other counseling recommendations.  Return in about 2 weeks (around 09/07/2017).   Scheryl Darter, MD

## 2017-09-07 ENCOUNTER — Other Ambulatory Visit (HOSPITAL_COMMUNITY)
Admission: RE | Admit: 2017-09-07 | Discharge: 2017-09-07 | Disposition: A | Discharge status: Home / Self Care | Payer: Medicaid Other | Source: Ambulatory Visit | Attending: Obstetrics & Gynecology | Admitting: Obstetrics & Gynecology

## 2017-09-07 ENCOUNTER — Ambulatory Visit (INDEPENDENT_AMBULATORY_CARE_PROVIDER_SITE_OTHER): Payer: Medicaid Other | Admitting: Obstetrics & Gynecology

## 2017-09-07 VITALS — BP 116/74 | HR 90 | Wt 258.0 lb

## 2017-09-07 DIAGNOSIS — Z3480 Encounter for supervision of other normal pregnancy, unspecified trimester: Secondary | ICD-10-CM

## 2017-09-07 DIAGNOSIS — O98813 Other maternal infectious and parasitic diseases complicating pregnancy, third trimester: Secondary | ICD-10-CM

## 2017-09-07 DIAGNOSIS — O98811 Other maternal infectious and parasitic diseases complicating pregnancy, first trimester: Secondary | ICD-10-CM | POA: Insufficient documentation

## 2017-09-07 DIAGNOSIS — A749 Chlamydial infection, unspecified: Secondary | ICD-10-CM | POA: Insufficient documentation

## 2017-09-07 DIAGNOSIS — Z3483 Encounter for supervision of other normal pregnancy, third trimester: Secondary | ICD-10-CM

## 2017-09-07 NOTE — Progress Notes (Signed)
   PRENATAL VISIT NOTE  Subjective:  Tanya Moreno is a 26 y.o. G3P2002 at [redacted]w[redacted]d being seen today for ongoing prenatal care.  She is currently monitored for the following issues for this low-risk pregnancy and has Latex allergy; Chlamydia infection affecting pregnancy in first trimester; Supervision of other normal pregnancy, antepartum; History of HSV; BMI 40.0-44.9, adult (HCC); Obesity in pregnancy, antepartum; and Carpal tunnel syndrome during pregnancy on her problem list.  Patient reports CTS sx, uses hand brace.  Contractions: Irritability. Vag. Bleeding: None.  Movement: Present. Denies leaking of fluid.   The following portions of the patient's history were reviewed and updated as appropriate: allergies, current medications, past family history, past medical history, past social history, past surgical history and problem list. Problem list updated.  Objective:   Vitals:   09/07/17 1310  BP: 116/74  Pulse: 90  Weight: 258 lb (117 kg)    Fetal Status: Fetal Heart Rate (bpm): 156   Movement: Present     General:  Alert, oriented and cooperative. Patient is in no acute distress.  Skin: Skin is warm and dry. No rash noted.   Cardiovascular: Normal heart rate noted  Respiratory: Normal respiratory effort, no problems with respiration noted  Abdomen: Soft, gravid, appropriate for gestational age.  Pain/Pressure: Present     Pelvic: Cervical exam deferred        Extremities: Normal range of motion.  Edema: None  Mental Status:  Normal mood and affect. Normal behavior. Normal judgment and thought content.   Assessment and Plan:  Pregnancy: G3P2002 at [redacted]w[redacted]d  1. Supervision of other normal pregnancy, antepartum   2. Chlamydia infection affecting pregnancy in first trimester  - Cervicovaginal ancillary only  Preterm labor symptoms and general obstetric precautions including but not limited to vaginal bleeding, contractions, leaking of fluid and fetal movement were reviewed in  detail with the patient. Please refer to After Visit Summary for other counseling recommendations.  Return in about 2 weeks (around 09/21/2017).   Scheryl Darter, MD

## 2017-09-07 NOTE — Progress Notes (Signed)
Pt c/o bilateral hand numbness, intermittent

## 2017-09-07 NOTE — Patient Instructions (Signed)
Third Trimester of Pregnancy The third trimester is from week 28 through week 40 (months 7 through 9). The third trimester is a time when the unborn baby (fetus) is growing rapidly. At the end of the ninth month, the fetus is about 20 inches in length and weighs 6-10 pounds. Body changes during your third trimester Your body will continue to go through many changes during pregnancy. The changes vary from woman to woman. During the third trimester:  Your weight will continue to increase. You can expect to gain 25-35 pounds (11-16 kg) by the end of the pregnancy.  You may begin to get stretch marks on your hips, abdomen, and breasts.  You may urinate more often because the fetus is moving lower into your pelvis and pressing on your bladder.  You may develop or continue to have heartburn. This is caused by increased hormones that slow down muscles in the digestive tract.  You may develop or continue to have constipation because increased hormones slow digestion and cause the muscles that push waste through your intestines to relax.  You may develop hemorrhoids. These are swollen veins (varicose veins) in the rectum that can itch or be painful.  You may develop swollen, bulging veins (varicose veins) in your legs.  You may have increased body aches in the pelvis, back, or thighs. This is due to weight gain and increased hormones that are relaxing your joints.  You may have changes in your hair. These can include thickening of your hair, rapid growth, and changes in texture. Some women also have hair loss during or after pregnancy, or hair that feels dry or thin. Your hair will most likely return to normal after your baby is born.  Your breasts will continue to grow and they will continue to become tender. A yellow fluid (colostrum) may leak from your breasts. This is the first milk you are producing for your baby.  Your belly button may stick out.  You may notice more swelling in your hands,  face, or ankles.  You may have increased tingling or numbness in your hands, arms, and legs. The skin on your belly may also feel numb.  You may feel short of breath because of your expanding uterus.  You may have more problems sleeping. This can be caused by the size of your belly, increased need to urinate, and an increase in your body's metabolism.  You may notice the fetus "dropping," or moving lower in your abdomen (lightening).  You may have increased vaginal discharge.  You may notice your joints feel loose and you may have pain around your pelvic bone.  What to expect at prenatal visits You will have prenatal exams every 2 weeks until week 36. Then you will have weekly prenatal exams. During a routine prenatal visit:  You will be weighed to make sure you and the baby are growing normally.  Your blood pressure will be taken.  Your abdomen will be measured to track your baby's growth.  The fetal heartbeat will be listened to.  Any test results from the previous visit will be discussed.  You may have a cervical check near your due date to see if your cervix has softened or thinned (effaced).  You will be tested for Group B streptococcus. This happens between 35 and 37 weeks.  Your health care provider may ask you:  What your birth plan is.  How you are feeling.  If you are feeling the baby move.  If you have had   any abnormal symptoms, such as leaking fluid, bleeding, severe headaches, or abdominal cramping.  If you are using any tobacco products, including cigarettes, chewing tobacco, and electronic cigarettes.  If you have any questions.  Other tests or screenings that may be performed during your third trimester include:  Blood tests that check for low iron levels (anemia).  Fetal testing to check the health, activity level, and growth of the fetus. Testing is done if you have certain medical conditions or if there are problems during the  pregnancy.  Nonstress test (NST). This test checks the health of your baby to make sure there are no signs of problems, such as the baby not getting enough oxygen. During this test, a belt is placed around your belly. The baby is made to move, and its heart rate is monitored during movement.  What is false labor? False labor is a condition in which you feel small, irregular tightenings of the muscles in the womb (contractions) that usually go away with rest, changing position, or drinking water. These are called Braxton Hicks contractions. Contractions may last for hours, days, or even weeks before true labor sets in. If contractions come at regular intervals, become more frequent, increase in intensity, or become painful, you should see your health care provider. What are the signs of labor?  Abdominal cramps.  Regular contractions that start at 10 minutes apart and become stronger and more frequent with time.  Contractions that start on the top of the uterus and spread down to the lower abdomen and back.  Increased pelvic pressure and dull back pain.  A watery or bloody mucus discharge that comes from the vagina.  Leaking of amniotic fluid. This is also known as your "water breaking." It could be a slow trickle or a gush. Let your health care provider know if it has a color or strange odor. If you have any of these signs, call your health care provider right away, even if it is before your due date. Follow these instructions at home: Medicines  Follow your health care provider's instructions regarding medicine use. Specific medicines may be either safe or unsafe to take during pregnancy.  Take a prenatal vitamin that contains at least 600 micrograms (mcg) of folic acid.  If you develop constipation, try taking a stool softener if your health care provider approves. Eating and drinking  Eat a balanced diet that includes fresh fruits and vegetables, whole grains, good sources of protein  such as meat, eggs, or tofu, and low-fat dairy. Your health care provider will help you determine the amount of weight gain that is right for you.  Avoid raw meat and uncooked cheese. These carry germs that can cause birth defects in the baby.  If you have low calcium intake from food, talk to your health care provider about whether you should take a daily calcium supplement.  Eat four or five small meals rather than three large meals a day.  Limit foods that are high in fat and processed sugars, such as fried and sweet foods.  To prevent constipation: ? Drink enough fluid to keep your urine clear or pale yellow. ? Eat foods that are high in fiber, such as fresh fruits and vegetables, whole grains, and beans. Activity  Exercise only as directed by your health care provider. Most women can continue their usual exercise routine during pregnancy. Try to exercise for 30 minutes at least 5 days a week. Stop exercising if you experience uterine contractions.  Avoid heavy   lifting.  Do not exercise in extreme heat or humidity, or at high altitudes.  Wear low-heel, comfortable shoes.  Practice good posture.  You may continue to have sex unless your health care provider tells you otherwise. Relieving pain and discomfort  Take frequent breaks and rest with your legs elevated if you have leg cramps or low back pain.  Take warm sitz baths to soothe any pain or discomfort caused by hemorrhoids. Use hemorrhoid cream if your health care provider approves.  Wear a good support bra to prevent discomfort from breast tenderness.  If you develop varicose veins: ? Wear support pantyhose or compression stockings as told by your healthcare provider. ? Elevate your feet for 15 minutes, 3-4 times a day. Prenatal care  Write down your questions. Take them to your prenatal visits.  Keep all your prenatal visits as told by your health care provider. This is important. Safety  Wear your seat belt at  all times when driving.  Make a list of emergency phone numbers, including numbers for family, friends, the hospital, and police and fire departments. General instructions  Avoid cat litter boxes and soil used by cats. These carry germs that can cause birth defects in the baby. If you have a cat, ask someone to clean the litter box for you.  Do not travel far distances unless it is absolutely necessary and only with the approval of your health care provider.  Do not use hot tubs, steam rooms, or saunas.  Do not drink alcohol.  Do not use any products that contain nicotine or tobacco, such as cigarettes and e-cigarettes. If you need help quitting, ask your health care provider.  Do not use any medicinal herbs or unprescribed drugs. These chemicals affect the formation and growth of the baby.  Do not douche or use tampons or scented sanitary pads.  Do not cross your legs for long periods of time.  To prepare for the arrival of your baby: ? Take prenatal classes to understand, practice, and ask questions about labor and delivery. ? Make a trial run to the hospital. ? Visit the hospital and tour the maternity area. ? Arrange for maternity or paternity leave through employers. ? Arrange for family and friends to take care of pets while you are in the hospital. ? Purchase a rear-facing car seat and make sure you know how to install it in your car. ? Pack your hospital bag. ? Prepare the baby's nursery. Make sure to remove all pillows and stuffed animals from the baby's crib to prevent suffocation.  Visit your dentist if you have not gone during your pregnancy. Use a soft toothbrush to brush your teeth and be gentle when you floss. Contact a health care provider if:  You are unsure if you are in labor or if your water has broken.  You become dizzy.  You have mild pelvic cramps, pelvic pressure, or nagging pain in your abdominal area.  You have lower back pain.  You have persistent  nausea, vomiting, or diarrhea.  You have an unusual or bad smelling vaginal discharge.  You have pain when you urinate. Get help right away if:  Your water breaks before 37 weeks.  You have regular contractions less than 5 minutes apart before 37 weeks.  You have a fever.  You are leaking fluid from your vagina.  You have spotting or bleeding from your vagina.  You have severe abdominal pain or cramping.  You have rapid weight loss or weight gain.    You have shortness of breath with chest pain.  You notice sudden or extreme swelling of your face, hands, ankles, feet, or legs.  Your baby makes fewer than 10 movements in 2 hours.  You have severe headaches that do not go away when you take medicine.  You have vision changes. Summary  The third trimester is from week 28 through week 40, months 7 through 9. The third trimester is a time when the unborn baby (fetus) is growing rapidly.  During the third trimester, your discomfort may increase as you and your baby continue to gain weight. You may have abdominal, leg, and back pain, sleeping problems, and an increased need to urinate.  During the third trimester your breasts will keep growing and they will continue to become tender. A yellow fluid (colostrum) may leak from your breasts. This is the first milk you are producing for your baby.  False labor is a condition in which you feel small, irregular tightenings of the muscles in the womb (contractions) that eventually go away. These are called Braxton Hicks contractions. Contractions may last for hours, days, or even weeks before true labor sets in.  Signs of labor can include: abdominal cramps; regular contractions that start at 10 minutes apart and become stronger and more frequent with time; watery or bloody mucus discharge that comes from the vagina; increased pelvic pressure and dull back pain; and leaking of amniotic fluid. This information is not intended to replace advice  given to you by your health care provider. Make sure you discuss any questions you have with your health care provider. Document Released: 11/17/2001 Document Revised: 04/30/2016 Document Reviewed: 01/24/2013 [employer] Interactive Patient Education  2017 [employer]  

## 2017-09-08 LAB — CERVICOVAGINAL ANCILLARY ONLY
CHLAMYDIA, DNA PROBE: NEGATIVE
NEISSERIA GONORRHEA: NEGATIVE

## 2017-09-21 ENCOUNTER — Encounter: Payer: Medicaid Other | Admitting: Obstetrics & Gynecology

## 2017-09-22 ENCOUNTER — Ambulatory Visit (INDEPENDENT_AMBULATORY_CARE_PROVIDER_SITE_OTHER): Payer: Medicaid Other | Admitting: Obstetrics and Gynecology

## 2017-09-22 VITALS — BP 109/74 | HR 85 | Wt 258.0 lb

## 2017-09-22 DIAGNOSIS — A749 Chlamydial infection, unspecified: Secondary | ICD-10-CM

## 2017-09-22 DIAGNOSIS — Z3480 Encounter for supervision of other normal pregnancy, unspecified trimester: Secondary | ICD-10-CM

## 2017-09-22 DIAGNOSIS — O98811 Other maternal infectious and parasitic diseases complicating pregnancy, first trimester: Secondary | ICD-10-CM

## 2017-09-22 DIAGNOSIS — O2641 Herpes gestationis, first trimester: Secondary | ICD-10-CM

## 2017-09-22 LAB — OB RESULTS CONSOLE GBS: STREP GROUP B AG: POSITIVE

## 2017-09-22 MED ORDER — VALACYCLOVIR HCL 500 MG PO TABS: 500.0000 mg | ORAL_TABLET | Freq: Every day | ORAL | 1 refills | Status: DC

## 2017-09-22 NOTE — Progress Notes (Signed)
   PRENATAL VISIT NOTE  Subjective:  Tanya Moreno is a 26 y.o. G3P2002 at 3052w6d being seen today for ongoing prenatal care.  She is currently monitored for the following issues for this low-risk pregnancy and has Latex allergy; Chlamydia infection affecting pregnancy in first trimester; Supervision of other normal pregnancy, antepartum; History of HSV; BMI 40.0-44.9, adult (HCC); Obesity in pregnancy, antepartum; and Carpal tunnel syndrome during pregnancy on her problem list.  Patient reports irregular, somewhat painful contractions.  Contractions: Not present. Vag. Bleeding: None.  Movement: Present. Denies leaking of fluid.   The following portions of the patient's history were reviewed and updated as appropriate: allergies, current medications, past family history, past medical history, past social history, past surgical history and problem list. Problem list updated.  Objective:   Vitals:   09/22/17 1502  BP: 109/74  Pulse: 85  Weight: 258 lb (117 kg)    Fetal Status:     Movement: Present     General:  Alert, oriented and cooperative. Patient is in no acute distress.  Skin: Skin is warm and dry. No rash noted.   Cardiovascular: Normal heart rate noted  Respiratory: Normal respiratory effort, no problems with respiration noted  Abdomen: Soft, gravid, appropriate for gestational age.  Pain/Pressure: Present     Pelvic: Cervical exam deferred       cephalic by palpation  Extremities: Normal range of motion.  Edema: None  Mental Status:  Normal mood and affect. Normal behavior. Normal judgment and thought content.   Assessment and Plan:  Pregnancy: G3P2002 at 9252w6d  1. History of HSV To start valtrex today - no symptoms  2. Chlamydia infection affecting pregnancy in first trimester Negative 10/2  3. Supervision of other normal pregnancy, antepartum GBS today Cephalic by palpation  Term labor symptoms and general obstetric precautions including but not limited to  vaginal bleeding, contractions, leaking of fluid and fetal movement were reviewed in detail with the patient. Please refer to After Visit Summary for other counseling recommendations.  Return in about 1 week (around 09/29/2017).   Conan BowensKelly M Davis, MD

## 2017-09-22 NOTE — Patient Instructions (Signed)
Third Trimester of Pregnancy The third trimester is from week 28 through week 40 (months 7 through 9). The third trimester is a time when the unborn baby (fetus) is growing rapidly. At the end of the ninth month, the fetus is about 20 inches in length and weighs 6-10 pounds. Body changes during your third trimester Your body will continue to go through many changes during pregnancy. The changes vary from woman to woman. During the third trimester:  Your weight will continue to increase. You can expect to gain 25-35 pounds (11-16 kg) by the end of the pregnancy.  You may begin to get stretch marks on your hips, abdomen, and breasts.  You may urinate more often because the fetus is moving lower into your pelvis and pressing on your bladder.  You may develop or continue to have heartburn. This is caused by increased hormones that slow down muscles in the digestive tract.  You may develop or continue to have constipation because increased hormones slow digestion and cause the muscles that push waste through your intestines to relax.  You may develop hemorrhoids. These are swollen veins (varicose veins) in the rectum that can itch or be painful.  You may develop swollen, bulging veins (varicose veins) in your legs.  You may have increased body aches in the pelvis, back, or thighs. This is due to weight gain and increased hormones that are relaxing your joints.  You may have changes in your hair. These can include thickening of your hair, rapid growth, and changes in texture. Some women also have hair loss during or after pregnancy, or hair that feels dry or thin. Your hair will most likely return to normal after your baby is born.  Your breasts will continue to grow and they will continue to become tender. A yellow fluid (colostrum) may leak from your breasts. This is the first milk you are producing for your baby.  Your belly button may stick out.  You may notice more swelling in your hands,  face, or ankles.  You may have increased tingling or numbness in your hands, arms, and legs. The skin on your belly may also feel numb.  You may feel short of breath because of your expanding uterus.  You may have more problems sleeping. This can be caused by the size of your belly, increased need to urinate, and an increase in your body's metabolism.  You may notice the fetus "dropping," or moving lower in your abdomen (lightening).  You may have increased vaginal discharge.  You may notice your joints feel loose and you may have pain around your pelvic bone.  What to expect at prenatal visits You will have prenatal exams every 2 weeks until week 36. Then you will have weekly prenatal exams. During a routine prenatal visit:  You will be weighed to make sure you and the baby are growing normally.  Your blood pressure will be taken.  Your abdomen will be measured to track your baby's growth.  The fetal heartbeat will be listened to.  Any test results from the previous visit will be discussed.  You may have a cervical check near your due date to see if your cervix has softened or thinned (effaced).  You will be tested for Group B streptococcus. This happens between 35 and 37 weeks.  Your health care provider may ask you:  What your birth plan is.  How you are feeling.  If you are feeling the baby move.  If you have had   any abnormal symptoms, such as leaking fluid, bleeding, severe headaches, or abdominal cramping.  If you are using any tobacco products, including cigarettes, chewing tobacco, and electronic cigarettes.  If you have any questions.  Other tests or screenings that may be performed during your third trimester include:  Blood tests that check for low iron levels (anemia).  Fetal testing to check the health, activity level, and growth of the fetus. Testing is done if you have certain medical conditions or if there are problems during the  pregnancy.  Nonstress test (NST). This test checks the health of your baby to make sure there are no signs of problems, such as the baby not getting enough oxygen. During this test, a belt is placed around your belly. The baby is made to move, and its heart rate is monitored during movement.  What is false labor? False labor is a condition in which you feel small, irregular tightenings of the muscles in the womb (contractions) that usually go away with rest, changing position, or drinking water. These are called Braxton Hicks contractions. Contractions may last for hours, days, or even weeks before true labor sets in. If contractions come at regular intervals, become more frequent, increase in intensity, or become painful, you should see your health care provider. What are the signs of labor?  Abdominal cramps.  Regular contractions that start at 10 minutes apart and become stronger and more frequent with time.  Contractions that start on the top of the uterus and spread down to the lower abdomen and back.  Increased pelvic pressure and dull back pain.  A watery or bloody mucus discharge that comes from the vagina.  Leaking of amniotic fluid. This is also known as your "water breaking." It could be a slow trickle or a gush. Let your health care provider know if it has a color or strange odor. If you have any of these signs, call your health care provider right away, even if it is before your due date. Follow these instructions at home: Medicines  Follow your health care provider's instructions regarding medicine use. Specific medicines may be either safe or unsafe to take during pregnancy.  Take a prenatal vitamin that contains at least 600 micrograms (mcg) of folic acid.  If you develop constipation, try taking a stool softener if your health care provider approves. Eating and drinking  Eat a balanced diet that includes fresh fruits and vegetables, whole grains, good sources of protein  such as meat, eggs, or tofu, and low-fat dairy. Your health care provider will help you determine the amount of weight gain that is right for you.  Avoid raw meat and uncooked cheese. These carry germs that can cause birth defects in the baby.  If you have low calcium intake from food, talk to your health care provider about whether you should take a daily calcium supplement.  Eat four or five small meals rather than three large meals a day.  Limit foods that are high in fat and processed sugars, such as fried and sweet foods.  To prevent constipation: ? Drink enough fluid to keep your urine clear or pale yellow. ? Eat foods that are high in fiber, such as fresh fruits and vegetables, whole grains, and beans. Activity  Exercise only as directed by your health care provider. Most women can continue their usual exercise routine during pregnancy. Try to exercise for 30 minutes at least 5 days a week. Stop exercising if you experience uterine contractions.  Avoid heavy   lifting.  Do not exercise in extreme heat or humidity, or at high altitudes.  Wear low-heel, comfortable shoes.  Practice good posture.  You may continue to have sex unless your health care provider tells you otherwise. Relieving pain and discomfort  Take frequent breaks and rest with your legs elevated if you have leg cramps or low back pain.  Take warm sitz baths to soothe any pain or discomfort caused by hemorrhoids. Use hemorrhoid cream if your health care provider approves.  Wear a good support bra to prevent discomfort from breast tenderness.  If you develop varicose veins: ? Wear support pantyhose or compression stockings as told by your healthcare provider. ? Elevate your feet for 15 minutes, 3-4 times a day. Prenatal care  Write down your questions. Take them to your prenatal visits.  Keep all your prenatal visits as told by your health care provider. This is important. Safety  Wear your seat belt at  all times when driving.  Make a list of emergency phone numbers, including numbers for family, friends, the hospital, and police and fire departments. General instructions  Avoid cat litter boxes and soil used by cats. These carry germs that can cause birth defects in the baby. If you have a cat, ask someone to clean the litter box for you.  Do not travel far distances unless it is absolutely necessary and only with the approval of your health care provider.  Do not use hot tubs, steam rooms, or saunas.  Do not drink alcohol.  Do not use any products that contain nicotine or tobacco, such as cigarettes and e-cigarettes. If you need help quitting, ask your health care provider.  Do not use any medicinal herbs or unprescribed drugs. These chemicals affect the formation and growth of the baby.  Do not douche or use tampons or scented sanitary pads.  Do not cross your legs for long periods of time.  To prepare for the arrival of your baby: ? Take prenatal classes to understand, practice, and ask questions about labor and delivery. ? Make a trial run to the hospital. ? Visit the hospital and tour the maternity area. ? Arrange for maternity or paternity leave through employers. ? Arrange for family and friends to take care of pets while you are in the hospital. ? Purchase a rear-facing car seat and make sure you know how to install it in your car. ? Pack your hospital bag. ? Prepare the baby's nursery. Make sure to remove all pillows and stuffed animals from the baby's crib to prevent suffocation.  Visit your dentist if you have not gone during your pregnancy. Use a soft toothbrush to brush your teeth and be gentle when you floss. Contact a health care provider if:  You are unsure if you are in labor or if your water has broken.  You become dizzy.  You have mild pelvic cramps, pelvic pressure, or nagging pain in your abdominal area.  You have lower back pain.  You have persistent  nausea, vomiting, or diarrhea.  You have an unusual or bad smelling vaginal discharge.  You have pain when you urinate. Get help right away if:  Your water breaks before 37 weeks.  You have regular contractions less than 5 minutes apart before 37 weeks.  You have a fever.  You are leaking fluid from your vagina.  You have spotting or bleeding from your vagina.  You have severe abdominal pain or cramping.  You have rapid weight loss or weight gain.    You have shortness of breath with chest pain.  You notice sudden or extreme swelling of your face, hands, ankles, feet, or legs.  Your baby makes fewer than 10 movements in 2 hours.  You have severe headaches that do not go away when you take medicine.  You have vision changes. Summary  The third trimester is from week 28 through week 40, months 7 through 9. The third trimester is a time when the unborn baby (fetus) is growing rapidly.  During the third trimester, your discomfort may increase as you and your baby continue to gain weight. You may have abdominal, leg, and back pain, sleeping problems, and an increased need to urinate.  During the third trimester your breasts will keep growing and they will continue to become tender. A yellow fluid (colostrum) may leak from your breasts. This is the first milk you are producing for your baby.  False labor is a condition in which you feel small, irregular tightenings of the muscles in the womb (contractions) that eventually go away. These are called Braxton Hicks contractions. Contractions may last for hours, days, or even weeks before true labor sets in.  Signs of labor can include: abdominal cramps; regular contractions that start at 10 minutes apart and become stronger and more frequent with time; watery or bloody mucus discharge that comes from the vagina; increased pelvic pressure and dull back pain; and leaking of amniotic fluid. This information is not intended to replace advice  given to you by your health care provider. Make sure you discuss any questions you have with your health care provider. Document Released: 11/17/2001 Document Revised: 04/30/2016 Document Reviewed: 01/24/2013 [employer] Interactive Patient Education  2017 [employer]  

## 2017-09-24 ENCOUNTER — Encounter: Payer: Self-pay | Admitting: Obstetrics and Gynecology

## 2017-09-24 DIAGNOSIS — B951 Streptococcus, group B, as the cause of diseases classified elsewhere: Secondary | ICD-10-CM | POA: Insufficient documentation

## 2017-09-24 LAB — STREP GP B NAA: STREP GROUP B AG: POSITIVE — AB

## 2017-09-29 ENCOUNTER — Ambulatory Visit (INDEPENDENT_AMBULATORY_CARE_PROVIDER_SITE_OTHER): Payer: Medicaid Other | Admitting: Obstetrics & Gynecology

## 2017-09-29 VITALS — BP 132/86 | HR 100 | Wt 259.0 lb

## 2017-09-29 DIAGNOSIS — Z3483 Encounter for supervision of other normal pregnancy, third trimester: Secondary | ICD-10-CM

## 2017-09-29 DIAGNOSIS — O3663X0 Maternal care for excessive fetal growth, third trimester, not applicable or unspecified: Secondary | ICD-10-CM

## 2017-09-29 DIAGNOSIS — O163 Unspecified maternal hypertension, third trimester: Secondary | ICD-10-CM

## 2017-09-29 DIAGNOSIS — Z3480 Encounter for supervision of other normal pregnancy, unspecified trimester: Secondary | ICD-10-CM

## 2017-09-29 DIAGNOSIS — B951 Streptococcus, group B, as the cause of diseases classified elsewhere: Secondary | ICD-10-CM

## 2017-09-29 NOTE — Progress Notes (Signed)
Pt states she has been having some LE swelling.  Pt states that she was feeling weak and faint last night, ? Dehydrated.

## 2017-09-29 NOTE — Patient Instructions (Addendum)
Return to clinic for any scheduled appointments or obstetric concerns, or go to [facility] for evaluation   Group B Streptococcus Infection During Pregnancy Group B Streptococcus (GBS) is a type of bacteria (Streptococcus agalactiae) that is often found in healthy people, commonly in the rectum, vagina, and intestines. In people who are healthy and not pregnant, the bacteria rarely cause serious illness or complications. However, women who test positive for GBS during pregnancy can pass the bacteria to their baby during childbirth, which can cause serious infection in the baby after birth. Women with GBS may also have infections during their pregnancy or immediately after childbirth, such as such as urinary tract infections (UTIs) or infections of the uterus (uterine infections). Having GBS also increases a woman's risk of complications during pregnancy, such as early (preterm) labor or delivery, miscarriage, or stillbirth. Routine testing (screening) for GBS is recommended for all pregnant women. What increases the risk? You may have a higher risk for GBS infection during pregnancy if you had one during a past pregnancy. What are the signs or symptoms? In most cases, GBS infection does not cause symptoms in pregnant women. Signs and symptoms of a possible GBS-related infection may include:  Labor starting before the 37th week of pregnancy.  A UTI or bladder infection, which may cause: ? Fever. ? Pain or burning during urination. ? Frequent urination.  Fever during labor, along with: ? Bad-smelling discharge. ? Uterine tenderness. ? Rapid heartbeat in the mother, baby, or both.  Rare but serious symptoms of a possible GBS-related infection in women include:  Blood infection (septicemia). This may cause fever, chills, or confusion.  Lung infection (pneumonia). This may cause fever, chills, cough, rapid breathing, difficulty breathing, or chest pain.  Bone, joint, skin, or soft tissue  infection.  How is this diagnosed? You may be screened for GBS between week 35 and week 37 of your pregnancy. If you have symptoms of preterm labor, you may be screened earlier. This condition is diagnosed based on lab test results from:  A swab of fluid from the vagina and rectum.  A urine sample.  How is this treated? This condition is treated with antibiotic medicine. When you go into labor, or as soon as your water breaks (your membranes rupture), you will be given antibiotics through an IV tube. Antibiotics will continue until after you give birth. If you are having a cesarean delivery, you do not need antibiotics unless your membranes have already ruptured. Follow these instructions at home:  Take over-the-counter and prescription medicines only as told by your health care provider.  Take your antibiotic medicine as told by your health care provider. Do not stop taking the antibiotic even if you start to feel better.  Keep all pre-birth (prenatal) visits and follow-up visits as told by your health care provider. This is important. Contact a health care provider if:  You have pain or burning when you urinate.  You have to urinate frequently.  You have a fever or chills.  You develop a bad-smelling vaginal discharge. Get help right away if:  Your membranes rupture.  You go into labor.  You have severe pain in your abdomen.  You have difficulty breathing.  You have chest pain. This information is not intended to replace advice given to you by your health care provider. Make sure you discuss any questions you have with your health care provider. Document Released: 03/01/2008 Document Revised: 06/19/2016 Document Reviewed: 06/18/2016 Elsevier Interactive Patient Education  [employer]2018 [employer]

## 2017-09-29 NOTE — Progress Notes (Signed)
   PRENATAL VISIT NOTE  Subjective:  Tanya Moreno is a 26 y.o. G3P2002 at 2767w6d being seen today for ongoing prenatal care.  She is currently monitored for the following issues for this low-risk pregnancy and has Latex allergy; Chlamydia infection affecting pregnancy in first trimester; Supervision of other normal pregnancy, antepartum; History of HSV; BMI 40.0-44.9, adult (HCC); Obesity in pregnancy, antepartum; Carpal tunnel syndrome during pregnancy; and Positive GBS test on her problem list.  Patient reports feeling weak last night. Feels better today.  No headaches. Reports episodes of blurry vision over the last couple of weeks, no black spots or loss of visual fields. No RUQ/epigastric pain.  Contractions: Irregular. Vag. Bleeding: None.  Movement: Present. Denies leaking of fluid.   The following portions of the patient's history were reviewed and updated as appropriate: allergies, current medications, past family history, past medical history, past social history, past surgical history and problem list. Problem list updated.  Objective:   Vitals:   09/29/17 1400  BP: 132/86  Pulse: 100  Weight: 259 lb (117.5 kg)  Initial BP was 150/83, immediate recheck 132/86. Usual BP is 110-120s/70-80a  Fetal Status: Fetal Heart Rate (bpm): 140 Fundal Height: 43 cm Movement: Present     General:  Alert, oriented and cooperative. Patient is in no acute distress.  Skin: Skin is warm and dry. No rash noted.   Cardiovascular: Normal heart rate noted  Respiratory: Normal respiratory effort, no problems with respiration noted  Abdomen: Soft, gravid, appropriate for gestational age.  Pain/Pressure: Present     Pelvic: Cervical exam deferred        Extremities: Normal range of motion.  Edema: Mild pitting, slight indentation  Mental Status:  Normal mood and affect. Normal behavior. Normal judgment and thought content.   Assessment and Plan:  Pregnancy: G3P2002 at 4267w6d  1. Elevated blood  pressure complicating pregnancy in third trimester, antepartum Concerned about above than normal BP. Will check labs today and manage accordingly. Preeclampsia precautions reviewed.  - CBC - Comprehensive metabolic panel - Protein / creatinine ratio, urine  2. Large for dates affecting management of mother, third trimester, not applicable or unspecified fetus Will check growth and AFI, also presentation. - US MFM OB FOLLOW UP; Future  3. Positive GBS test Needs intrapartum prophylaxis  4. Supervision of other normal pregnancy, antepartum Term labor symptoms and general obstetric precautions including but not limited to vaginal bleeding, contractions, leaking of fluid and fetal movement were reviewed in detail with the patient. Please refer to After Visit Summary for other counseling recommendations.  Return in about 1 week (around 10/06/2017) for OB Visit.   Tanya CollinsUgonna Estel Tonelli, MD

## 2017-09-30 LAB — CBC
HEMATOCRIT: 34.6 % (ref 34.0–46.6)
Hemoglobin: 11.8 g/dL (ref 11.1–15.9)
MCH: 30.3 pg (ref 26.6–33.0)
MCHC: 34.1 g/dL (ref 31.5–35.7)
MCV: 89 fL (ref 79–97)
Platelets: 217 10*3/uL (ref 150–379)
RBC: 3.9 x10E6/uL (ref 3.77–5.28)
RDW: 13.5 % (ref 12.3–15.4)
WBC: 6.7 10*3/uL (ref 3.4–10.8)

## 2017-09-30 LAB — COMPREHENSIVE METABOLIC PANEL
ALBUMIN: 3.7 g/dL (ref 3.5–5.5)
ALK PHOS: 143 IU/L — AB (ref 39–117)
ALT: 13 IU/L (ref 0–32)
AST: 15 IU/L (ref 0–40)
Albumin/Globulin Ratio: 1.2 (ref 1.2–2.2)
BUN / CREAT RATIO: 5 — AB (ref 9–23)
BUN: 3 mg/dL — ABNORMAL LOW (ref 6–20)
Bilirubin Total: 0.3 mg/dL (ref 0.0–1.2)
CALCIUM: 9.6 mg/dL (ref 8.7–10.2)
CO2: 19 mmol/L — AB (ref 20–29)
CREATININE: 0.61 mg/dL (ref 0.57–1.00)
Chloride: 105 mmol/L (ref 96–106)
GFR calc Af Amer: 144 mL/min/{1.73_m2} (ref >59)
GFR calc non Af Amer: 125 mL/min/{1.73_m2} (ref >59)
GLUCOSE: 88 mg/dL (ref 65–99)
Globulin, Total: 3.1 g/dL (ref 1.5–4.5)
Potassium: 3.8 mmol/L (ref 3.5–5.2)
Sodium: 138 mmol/L (ref 134–144)
TOTAL PROTEIN: 6.8 g/dL (ref 6.0–8.5)

## 2017-09-30 LAB — PROTEIN / CREATININE RATIO, URINE
CREATININE, UR: 161.2 mg/dL
PROTEIN UR: 25.8 mg/dL
Protein/Creat Ratio: 160 mg/g creat (ref 0–200)

## 2017-10-04 ENCOUNTER — Inpatient Hospital Stay (HOSPITAL_COMMUNITY)
Admission: AD | Admit: 2017-10-04 | Discharge: 2017-10-04 | Disposition: A | Discharge status: Home / Self Care | Payer: Medicaid Other | Source: Ambulatory Visit | Attending: Obstetrics and Gynecology | Admitting: Obstetrics and Gynecology

## 2017-10-04 ENCOUNTER — Ambulatory Visit (HOSPITAL_COMMUNITY)
Admission: RE | Admit: 2017-10-04 | Discharge: 2017-10-04 | Disposition: A | Discharge status: Home / Self Care | Payer: Medicaid Other | Source: Ambulatory Visit | Attending: Obstetrics & Gynecology | Admitting: Obstetrics & Gynecology

## 2017-10-04 ENCOUNTER — Encounter: Payer: Self-pay | Admitting: Obstetrics & Gynecology

## 2017-10-04 ENCOUNTER — Encounter (HOSPITAL_COMMUNITY): Payer: Self-pay | Admitting: *Deleted

## 2017-10-04 DIAGNOSIS — O99213 Obesity complicating pregnancy, third trimester: Secondary | ICD-10-CM | POA: Diagnosis not present

## 2017-10-04 DIAGNOSIS — Z3A38 38 weeks gestation of pregnancy: Secondary | ICD-10-CM | POA: Insufficient documentation

## 2017-10-04 DIAGNOSIS — O98811 Other maternal infectious and parasitic diseases complicating pregnancy, first trimester: Secondary | ICD-10-CM

## 2017-10-04 DIAGNOSIS — A749 Chlamydial infection, unspecified: Secondary | ICD-10-CM

## 2017-10-04 DIAGNOSIS — B951 Streptococcus, group B, as the cause of diseases classified elsewhere: Secondary | ICD-10-CM

## 2017-10-04 DIAGNOSIS — E669 Obesity, unspecified: Secondary | ICD-10-CM | POA: Insufficient documentation

## 2017-10-04 DIAGNOSIS — Z362 Encounter for other antenatal screening follow-up: Secondary | ICD-10-CM | POA: Diagnosis present

## 2017-10-04 DIAGNOSIS — O471 False labor at or after 37 completed weeks of gestation: Secondary | ICD-10-CM

## 2017-10-04 DIAGNOSIS — O3663X0 Maternal care for excessive fetal growth, third trimester, not applicable or unspecified: Secondary | ICD-10-CM | POA: Insufficient documentation

## 2017-10-04 NOTE — [facility] Note (Signed)
Pt here with c/o contractions all day; denies any bleeding or leaking of fluid. GBS positive. Reports good fetal movement.

## 2017-10-04 NOTE — Discharge Instructions (Signed)

## 2017-10-06 ENCOUNTER — Ambulatory Visit (INDEPENDENT_AMBULATORY_CARE_PROVIDER_SITE_OTHER): Payer: Medicaid Other | Admitting: Obstetrics and Gynecology

## 2017-10-06 VITALS — BP 131/84 | HR 97 | Wt 258.0 lb

## 2017-10-06 DIAGNOSIS — A749 Chlamydial infection, unspecified: Secondary | ICD-10-CM

## 2017-10-06 DIAGNOSIS — O98811 Other maternal infectious and parasitic diseases complicating pregnancy, first trimester: Secondary | ICD-10-CM

## 2017-10-06 DIAGNOSIS — B951 Streptococcus, group B, as the cause of diseases classified elsewhere: Secondary | ICD-10-CM

## 2017-10-06 DIAGNOSIS — G5600 Carpal tunnel syndrome, unspecified upper limb: Secondary | ICD-10-CM

## 2017-10-06 DIAGNOSIS — Z3480 Encounter for supervision of other normal pregnancy, unspecified trimester: Secondary | ICD-10-CM

## 2017-10-06 DIAGNOSIS — O2641 Herpes gestationis, first trimester: Secondary | ICD-10-CM

## 2017-10-06 DIAGNOSIS — O3663X0 Maternal care for excessive fetal growth, third trimester, not applicable or unspecified: Secondary | ICD-10-CM

## 2017-10-06 DIAGNOSIS — O26899 Other specified pregnancy related conditions, unspecified trimester: Secondary | ICD-10-CM

## 2017-10-06 MED ORDER — WRIST SPLINT/COCK-UP/RIGHT L MISC: 1.0000 | Freq: Every day | 0 refills | Status: DC

## 2017-10-06 MED ORDER — WRIST SPLINT/COCK-UP/LEFT L MISC: 1.0000 | Freq: Every day | 0 refills | Status: DC

## 2017-10-06 NOTE — Progress Notes (Signed)
   PRENATAL VISIT NOTE  Subjective:  Tanya Moreno is a 26 y.o. G3P2002 at 2329w6d being seen today for ongoing prenatal care.  She is currently monitored for the following issues for this high-risk pregnancy and has Latex allergy; Chlamydia infection affecting pregnancy in first trimester; Supervision of other normal pregnancy, antepartum; History of HSV; BMI 40.0-44.9, adult (HCC); Obesity in pregnancy, antepartum; Carpal tunnel syndrome during pregnancy; Positive GBS test; and LGA (large for gestational age) fetus, third trimester on her problem list.  Patient reports contractions since the last few days, was seen in [facility] and sent home, irregular contractions since.  Contractions: Irregular. Vag. Bleeding: None.  Movement: Present. Denies leaking of fluid. Denies HSV lesions or symptoms.    The following portions of the patient's history were reviewed and updated as appropriate: allergies, current medications, past family history, past medical history, past social history, past surgical history and problem list. Problem list updated.  Objective:   Vitals:   10/06/17 1342  BP: 131/84  Pulse: 97  Weight: 258 lb (117 kg)    Fetal Status: Fetal Heart Rate (bpm): 135   Movement: Present     General:  Alert, oriented and cooperative. Patient is in no acute distress.  Skin: Skin is warm and dry. No rash noted.   Cardiovascular: Normal heart rate noted  Respiratory: Normal respiratory effort, no problems with respiration noted  Abdomen: Soft, gravid, appropriate for gestational age.  Pain/Pressure: Present     Pelvic: Cervical exam deferred        Extremities: Normal range of motion.     Mental Status:  Normal mood and affect. Normal behavior. Normal judgment and thought content.   Assessment and Plan:  Pregnancy: G3P2002 at 6529w6d  1. History of HSV No symptoms Cont valtrex  2. Chlamydia infection affecting pregnancy in first trimester Neg TOC  3. Supervision of other normal  pregnancy, antepartum  4. LGA (large for gestational age) fetus, third trimester Counseled regarding risks of shoulder dystocia, reviewed that she does not meet criteria for c-section for macrosomia, patient and partner verbalize understanding of risks  5. Carpal tunnel syndrome during pregnancy - Elastic Bandages & Supports (WRIST SPLINT/COCK-UP/LEFT L) MISC; 1 Device by Does not apply route daily.  Dispense: 1 each; Refill: 0 - Elastic Bandages & Supports (WRIST SPLINT/COCK-UP/RIGHT L) MISC; 1 Device by Does not apply route daily.  Dispense: 1 each; Refill: 0  6. Positive GBS test ppx in labor   Term labor symptoms and general obstetric precautions including but not limited to vaginal bleeding, contractions, leaking of fluid and fetal movement were reviewed in detail with the patient. Please refer to After Visit Summary for other counseling recommendations.  Return in about 1 week (around 10/13/2017) for OB visit.   Conan BowensKelly M Dantrell Schertzer, MD

## 2017-10-06 NOTE — Progress Notes (Signed)
Pt would like cervical exam today. 

## 2017-10-07 ENCOUNTER — Encounter (HOSPITAL_COMMUNITY): Payer: Self-pay

## 2017-10-07 ENCOUNTER — Inpatient Hospital Stay (HOSPITAL_COMMUNITY)
Admission: AD | Admit: 2017-10-07 | Discharge: 2017-10-07 | Disposition: A | Discharge status: Home / Self Care | Payer: Medicaid Other | Source: Ambulatory Visit | Attending: Obstetrics and Gynecology | Admitting: Obstetrics and Gynecology

## 2017-10-07 DIAGNOSIS — O479 False labor, unspecified: Secondary | ICD-10-CM | POA: Diagnosis present

## 2017-10-07 DIAGNOSIS — Z3A00 Weeks of gestation of pregnancy not specified: Secondary | ICD-10-CM | POA: Diagnosis not present

## 2017-10-07 NOTE — [facility] Note (Signed)
Pt reports contractions every 6-7 mins and vaginal pressure that started an hour ago. Pt denies LOF or vaginal bleeding. Reports good fetal movement. Cervix was 4 cm several days ago.

## 2017-10-07 NOTE — Discharge Instructions (Signed)
Braxton Hicks Contractions °Contractions of the uterus can occur throughout pregnancy, but they are not always a sign that you are in labor. You may have practice contractions called Braxton Hicks contractions. These false labor contractions are sometimes confused with true labor. °What are Braxton Hicks contractions? °Braxton Hicks contractions are tightening movements that occur in the muscles of the uterus before labor. Unlike true labor contractions, these contractions do not result in opening (dilation) and thinning of the cervix. Toward the end of pregnancy (32-34 weeks), Braxton Hicks contractions can happen more often and may become stronger. These contractions are sometimes difficult to tell apart from true labor because they can be very uncomfortable. You should not feel embarrassed if you go to the hospital with false labor. °Sometimes, the only way to tell if you are in true labor is for your health care provider to look for changes in the cervix. The health care provider will do a physical exam and may monitor your contractions. If you are not in true labor, the exam should show that your cervix is not dilating and your water has not broken. °If there are no prenatal problems or other health problems associated with your pregnancy, it is completely safe for you to be sent home with false labor. You may continue to have Braxton Hicks contractions until you go into true labor. °How can I tell the difference between true labor and false labor? °· Differences °? False labor °? Contractions last 30-70 seconds.: Contractions are usually shorter and not as strong as true labor contractions. °? Contractions become very regular.: Contractions are usually irregular. °? Discomfort is usually felt in the top of the uterus, and it spreads to the lower abdomen and low back.: Contractions are often felt in the front of the lower abdomen and in the groin. °? Contractions do not go away with walking.: Contractions may  go away when you walk around or change positions while lying down. °? Contractions usually become more intense and increase in frequency.: Contractions get weaker and are shorter-lasting as time goes on. °? The cervix dilates and gets thinner.: The cervix usually does not dilate or become thin. °Follow these instructions at home: °· Take over-the-counter and prescription medicines only as told by your health care provider. °· Keep up with your usual exercises and follow other instructions from your health care provider. °· Eat and drink lightly if you think you are going into labor. °· If Braxton Hicks contractions are making you uncomfortable: °? Change your position from lying down or resting to walking, or change from walking to resting. °? Sit and rest in a tub of warm water. °? Drink enough fluid to keep your urine clear or pale yellow. Dehydration may cause these contractions. °? Do slow and deep breathing several times an hour. °· Keep all follow-up prenatal visits as told by your health care provider. This is important. °Contact a health care provider if: °· You have a fever. °· You have continuous pain in your abdomen. °Get help right away if: °· Your contractions become stronger, more regular, and closer together. °· You have fluid leaking or gushing from your vagina. °· You pass blood-tinged mucus (bloody show). °· You have bleeding from your vagina. °· You have low back pain that you never had before. °· You feel your baby’s head pushing down and causing pelvic pressure. °· Your baby is not moving inside you as much as it used to. °Summary °· Contractions that occur before labor are   called Braxton Hicks contractions, false labor, or practice contractions. °· Braxton Hicks contractions are usually shorter, weaker, farther apart, and less regular than true labor contractions. True labor contractions usually become progressively stronger and regular and they become more frequent. °· Manage discomfort from  Braxton Hicks contractions by changing position, resting in a warm bath, drinking plenty of water, or practicing deep breathing. °This information is not intended to replace advice given to you by your health care provider. Make sure you discuss any questions you have with your health care provider. °Document Released: 11/23/2005 Document Revised: 10/12/2016 Document Reviewed: 10/12/2016 °Elsevier Interactive Patient Education © 2017 [employer] ° ° °Fetal Movement Counts °Patient Name: ________________________________________________ Patient Due Date: ____________________ °What is a fetal movement count? °A fetal movement count is the number of times that you feel your baby move during a certain amount of time. This may also be called a fetal kick count. A fetal movement count is recommended for every pregnant woman. You may be asked to start counting fetal movements as early as week 28 of your pregnancy. °Pay attention to when your baby is most active. You may notice your baby's sleep and wake cycles. You may also notice things that make your baby move more. You should do a fetal movement count: °· When your baby is normally most active. °· At the same time each day. ° °A good time to count movements is while you are resting, after having something to eat and drink. °How do I count fetal movements? °1. Find a quiet, comfortable area. Sit, or lie down on your side. °2. Write down the date, the start time and stop time, and the number of movements that you felt between those two times. Take this information with you to your health care visits. °3. For 2 hours, count kicks, flutters, swishes, rolls, and jabs. You should feel at least 10 movements during 2 hours. °4. You may stop counting after you have felt 10 movements. °5. If you do not feel 10 movements in 2 hours, have something to eat and drink. Then, keep resting and counting for 1 hour. If you feel at least 4 movements during that hour, you may stop  counting. °Contact a health care provider if: °· You feel fewer than 4 movements in 2 hours. °· Your baby is not moving like he or she usually does. °Date: ____________ Start time: ____________ Stop time: ____________ Movements: ____________ °Date: ____________ Start time: ____________ Stop time: ____________ Movements: ____________ °Date: ____________ Start time: ____________ Stop time: ____________ Movements: ____________ °Date: ____________ Start time: ____________ Stop time: ____________ Movements: ____________ °Date: ____________ Start time: ____________ Stop time: ____________ Movements: ____________ °Date: ____________ Start time: ____________ Stop time: ____________ Movements: ____________ °Date: ____________ Start time: ____________ Stop time: ____________ Movements: ____________ °Date: ____________ Start time: ____________ Stop time: ____________ Movements: ____________ °Date: ____________ Start time: ____________ Stop time: ____________ Movements: ____________ °This information is not intended to replace advice given to you by your health care provider. Make sure you discuss any questions you have with your health care provider. °Document Released: 12/23/2006 Document Revised: 07/22/2016 Document Reviewed: 01/02/2016 °Elsevier Interactive Patient Education © 2018 [employer] ° °

## 2017-10-07 NOTE — MAU Note (Signed)
I have communicated with Dr. Jules Schickim Lockamy and reviewed vital signs:  Vitals:   10/07/17 2236  BP: 131/74  Pulse: 85  Resp: 18  Temp: 98.2 F (36.8 C)    Vaginal exam:  Dilation: 2.5 Effacement (%): 80 Cervical Position: Posterior Station: -3 Presentation: Vertex Exam by:: [place]any Jayvien Rowlette, RN ,   Also reviewed contraction pattern and that non-stress test is reactive.  It has been documented that patient is contracting every 6-7 minutes and was unchanged from last office visit.  Patient denies any other complaints.  Based on this report provider has given order for discharge.  A discharge order and diagnosis entered by a provider.   Labor discharge instructions reviewed with patient.

## 2017-10-13 ENCOUNTER — Ambulatory Visit (INDEPENDENT_AMBULATORY_CARE_PROVIDER_SITE_OTHER): Payer: Medicaid Other | Admitting: Obstetrics & Gynecology

## 2017-10-13 ENCOUNTER — Telehealth (HOSPITAL_COMMUNITY): Payer: Self-pay | Admitting: *Deleted

## 2017-10-13 DIAGNOSIS — Z3480 Encounter for supervision of other normal pregnancy, unspecified trimester: Secondary | ICD-10-CM

## 2017-10-13 NOTE — Progress Notes (Signed)
   PRENATAL VISIT NOTE  Subjective:  Tanya Moreno is a 26 y.o. G3P2002 at 4242w6d being seen today for ongoing prenatal care.  She is currently monitored for the following issues for this low-risk pregnancy and has Latex allergy; Chlamydia infection affecting pregnancy in first trimester; Supervision of other normal pregnancy, antepartum; History of HSV; BMI 40.0-44.9, adult (HCC); Obesity in pregnancy, antepartum; Carpal tunnel syndrome during pregnancy; Positive GBS test; and LGA (large for gestational age) fetus, third trimester on their problem list.  Patient reports occasional contractions.  Contractions: Irregular. Vag. Bleeding: None.  Movement: Present. Denies leaking of fluid.   The following portions of the patient's history were reviewed and updated as appropriate: allergies, current medications, past family history, past medical history, past social history, past surgical history and problem list. Problem list updated.  Objective:   Vitals:   10/13/17 1422  BP: 125/76  Pulse: 91  Weight: 262 lb (118.8 kg)    Fetal Status: Fetal Heart Rate (bpm): 138   Movement: Present     General:  Alert, oriented and cooperative. Patient is in no acute distress.  Skin: Skin is warm and dry. No rash noted.   Cardiovascular: Normal heart rate noted  Respiratory: Normal respiratory effort, no problems with respiration noted  Abdomen: Soft, gravid, appropriate for gestational age.  Pain/Pressure: Present     Pelvic: Cervical exam performed        Extremities: Normal range of motion.  Edema: Mild pitting, slight indentation  Mental Status:  Normal mood and affect. Normal behavior. Normal judgment and thought content.   Assessment and Plan:  Pregnancy: G3P2002 at 8142w6d  1. Supervision of other normal pregnancy, antepartum US result reviewed  Term labor symptoms and general obstetric precautions including but not limited to vaginal bleeding, contractions, leaking of fluid and fetal  movement were reviewed in detail with the patient. Please refer to After Visit Summary for other counseling recommendations.  Return in about 5 days (around 10/18/2017).   Scheryl DarterJames Arnold, MD

## 2017-10-13 NOTE — Patient Instructions (Signed)
Labor Induction Labor induction is when steps are taken to cause a pregnant woman to begin the labor process. Most women go into labor on their own between 37 weeks and 42 weeks of the pregnancy. When this does not happen or when there is a medical need, methods may be used to induce labor. Labor induction causes a pregnant woman's uterus to contract. It also causes the cervix to soften (ripen), open (dilate), and thin out (efface). Usually, labor is not induced before 39 weeks of the pregnancy unless there is a problem with the baby or mother. Before inducing labor, your health care provider will consider a number of factors, including the following:  The medical condition of you and the baby.  How many weeks along you are.  The status of the baby's lung maturity.  The condition of the cervix.  The position of the baby. What are the reasons for labor induction? Labor may be induced for the following reasons:  The health of the baby or mother is at risk.  The pregnancy is overdue by 1 week or more.  The water breaks but labor does not start on its own.  The mother has a health condition or serious illness, such as high blood pressure, infection, placental abruption, or diabetes.  The amniotic fluid amounts are low around the baby.  The baby is distressed. Convenience or wanting the baby to be born on a certain date is not a reason for inducing labor. What methods are used for labor induction? Several methods of labor induction may be used, such as:  Prostaglandin medicine. This medicine causes the cervix to dilate and ripen. The medicine will also start contractions. It can be taken by mouth or by inserting a suppository into the vagina.  Inserting a thin tube (catheter) with a balloon on the end into the vagina to dilate the cervix. Once inserted, the balloon is expanded with water, which causes the cervix to open.  Stripping the membranes. Your health care provider separates  amniotic sac tissue from the cervix, causing the cervix to be stretched and causing the release of a hormone called progesterone. This may cause the uterus to contract. It is often done during an office visit. You will be sent home to wait for the contractions to begin. You will then come in for an induction.  Breaking the water. Your health care provider makes a hole in the amniotic sac using a small instrument. Once the amniotic sac breaks, contractions should begin. This may still take hours to see an effect.  Medicine to trigger or strengthen contractions. This medicine is given through an IV access tube inserted into a vein in your arm. All of the methods of induction, besides stripping the membranes, will be done in the hospital. Induction is done in the hospital so that you and the baby can be carefully monitored. How long does it take for labor to be induced? Some inductions can take up to 2-3 days. Depending on the cervix, it usually takes less time. It takes longer when you are induced early in the pregnancy or if this is your first pregnancy. If a mother is still pregnant and the induction has been going on for 2-3 days, either the mother will be sent home or a cesarean delivery will be needed. What are the risks associated with labor induction? Some of the risks of induction include:  Changes in fetal heart rate, such as too high, too low, or erratic.  Fetal distress.    Chance of infection for the mother and baby.  Increased chance of having a cesarean delivery.  Breaking off (abruption) of the placenta from the uterus (rare).  Uterine rupture (very rare). When induction is needed for medical reasons, the benefits of induction may outweigh the risks. What are some reasons for not inducing labor? Labor induction should not be done if:  It is shown that your baby does not tolerate labor.  You have had previous surgeries on your uterus, such as a myomectomy or the removal of  fibroids.  Your placenta lies very low in the uterus and blocks the opening of the cervix (placenta previa).  Your baby is not in a head-down position.  The umbilical cord drops down into the birth canal in front of the baby. This could cut off the baby's blood and oxygen supply.  You have had a previous cesarean delivery.  There are unusual circumstances, such as the baby being extremely premature. This information is not intended to replace advice given to you by your health care provider. Make sure you discuss any questions you have with your health care provider. Document Released: 04/14/2007 Document Revised: 04/30/2016 Document Reviewed: 06/22/2013 Elsevier Interactive Patient Education  2017 Elsevier Inc.  

## 2017-10-13 NOTE — Telephone Encounter (Signed)
Preadmission screen  

## 2017-10-15 ENCOUNTER — Inpatient Hospital Stay (HOSPITAL_COMMUNITY)
Admission: AD | Admit: 2017-10-15 | Discharge: 2017-10-19 | DRG: 806 | Disposition: A | Discharge status: Home / Self Care | Payer: Medicaid Other | Source: Ambulatory Visit | Attending: Obstetrics & Gynecology | Admitting: Obstetrics & Gynecology

## 2017-10-15 ENCOUNTER — Other Ambulatory Visit: Payer: Self-pay

## 2017-10-15 ENCOUNTER — Encounter (HOSPITAL_COMMUNITY): Payer: Self-pay | Admitting: *Deleted

## 2017-10-15 DIAGNOSIS — G5600 Carpal tunnel syndrome, unspecified upper limb: Secondary | ICD-10-CM | POA: Diagnosis present

## 2017-10-15 DIAGNOSIS — O133 Gestational [pregnancy-induced] hypertension without significant proteinuria, third trimester: Secondary | ICD-10-CM

## 2017-10-15 DIAGNOSIS — O134 Gestational [pregnancy-induced] hypertension without significant proteinuria, complicating childbirth: Secondary | ICD-10-CM | POA: Diagnosis present

## 2017-10-15 DIAGNOSIS — O99824 Streptococcus B carrier state complicating childbirth: Secondary | ICD-10-CM | POA: Diagnosis present

## 2017-10-15 DIAGNOSIS — Z91040 Latex allergy status: Secondary | ICD-10-CM

## 2017-10-15 DIAGNOSIS — B951 Streptococcus, group B, as the cause of diseases classified elsewhere: Secondary | ICD-10-CM

## 2017-10-15 DIAGNOSIS — O3663X0 Maternal care for excessive fetal growth, third trimester, not applicable or unspecified: Secondary | ICD-10-CM | POA: Diagnosis present

## 2017-10-15 DIAGNOSIS — Z87891 Personal history of nicotine dependence: Secondary | ICD-10-CM

## 2017-10-15 DIAGNOSIS — O98811 Other maternal infectious and parasitic diseases complicating pregnancy, first trimester: Secondary | ICD-10-CM

## 2017-10-15 DIAGNOSIS — A749 Chlamydial infection, unspecified: Secondary | ICD-10-CM

## 2017-10-15 DIAGNOSIS — A6000 Herpesviral infection of urogenital system, unspecified: Secondary | ICD-10-CM | POA: Diagnosis present

## 2017-10-15 DIAGNOSIS — Z3A40 40 weeks gestation of pregnancy: Secondary | ICD-10-CM | POA: Diagnosis not present

## 2017-10-15 DIAGNOSIS — O9832 Other infections with a predominantly sexual mode of transmission complicating childbirth: Secondary | ICD-10-CM | POA: Diagnosis present

## 2017-10-15 LAB — CBC WITH DIFFERENTIAL/PLATELET
BASOS ABS: 0 10*3/uL (ref 0.0–0.1)
BASOS PCT: 0 %
EOS PCT: 1 %
Eosinophils Absolute: 0.1 10*3/uL (ref 0.0–0.7)
HCT: 33.9 % — ABNORMAL LOW (ref 36.0–46.0)
Hemoglobin: 11.5 g/dL — ABNORMAL LOW (ref 12.0–15.0)
LYMPHS PCT: 37 %
Lymphs Abs: 3.2 10*3/uL (ref 0.7–4.0)
MCH: 29.8 pg (ref 26.0–34.0)
MCHC: 33.9 g/dL (ref 30.0–36.0)
MCV: 87.8 fL (ref 78.0–100.0)
Monocytes Absolute: 1.1 10*3/uL — ABNORMAL HIGH (ref 0.1–1.0)
Monocytes Relative: 13 %
NEUTROS ABS: 4.3 10*3/uL (ref 1.7–7.7)
Neutrophils Relative %: 49 %
PLATELETS: 232 10*3/uL (ref 150–400)
RBC: 3.86 MIL/uL — AB (ref 3.87–5.11)
RDW: 13.1 % (ref 11.5–15.5)
WBC: 8.7 10*3/uL (ref 4.0–10.5)

## 2017-10-15 LAB — COMPREHENSIVE METABOLIC PANEL
ALBUMIN: 2.9 g/dL — AB (ref 3.5–5.0)
ALT: 18 U/L (ref 14–54)
AST: 26 U/L (ref 15–41)
Alkaline Phosphatase: 158 U/L — ABNORMAL HIGH (ref 38–126)
Anion gap: 9 (ref 5–15)
CHLORIDE: 104 mmol/L (ref 101–111)
CO2: 21 mmol/L — ABNORMAL LOW (ref 22–32)
CREATININE: 0.51 mg/dL (ref 0.44–1.00)
Calcium: 9.2 mg/dL (ref 8.9–10.3)
GFR calc Af Amer: >60 mL/min (ref >60)
GLUCOSE: 114 mg/dL — AB (ref 65–99)
POTASSIUM: 3.2 mmol/L — AB (ref 3.5–5.1)
Sodium: 134 mmol/L — ABNORMAL LOW (ref 135–145)
Total Bilirubin: 0.6 mg/dL (ref 0.3–1.2)
Total Protein: 6.7 g/dL (ref 6.5–8.1)

## 2017-10-15 LAB — URINALYSIS, ROUTINE W REFLEX MICROSCOPIC
Bilirubin Urine: NEGATIVE
GLUCOSE, UA: 150 mg/dL — AB
Hgb urine dipstick: NEGATIVE
KETONES UR: NEGATIVE mg/dL
Nitrite: NEGATIVE
PROTEIN: NEGATIVE mg/dL
Specific Gravity, Urine: 1.011 (ref 1.005–1.030)
pH: 6 (ref 5.0–8.0)

## 2017-10-15 LAB — PROTEIN / CREATININE RATIO, URINE
CREATININE, URINE: 78 mg/dL
PROTEIN CREATININE RATIO: 0.17 mg/mg{creat} — AB (ref 0.00–0.15)
TOTAL PROTEIN, URINE: 13 mg/dL

## 2017-10-15 MED ORDER — ACETAMINOPHEN 325 MG PO TABS: 650.0000 mg | ORAL_TABLET | ORAL | Status: DC | PRN

## 2017-10-15 MED ORDER — PHENYLEPHRINE 40 MCG/ML (10ML) SYRINGE FOR IV PUSH (FOR BLOOD PRESSURE SUPPORT)
80.0000 ug | PREFILLED_SYRINGE | INTRAVENOUS | Status: DC | PRN
  Filled 2017-10-15: qty 5

## 2017-10-15 MED ORDER — LACTATED RINGERS IV SOLN
INTRAVENOUS | Status: DC
  Administered 2017-10-16 (×2): via INTRAVENOUS

## 2017-10-15 MED ORDER — PENICILLIN G POT IN DEXTROSE 60000 UNIT/ML IV SOLN
3.0000 10*6.[IU] | INTRAVENOUS | Status: DC
  Administered 2017-10-16 (×5): 3 10*6.[IU] via INTRAVENOUS
  Filled 2017-10-15 (×9): qty 50

## 2017-10-15 MED ORDER — OXYTOCIN 40 UNITS IN LACTATED RINGERS INFUSION - SIMPLE MED: 2.5000 [IU]/h | INTRAVENOUS | Status: DC

## 2017-10-15 MED ORDER — DIPHENHYDRAMINE HCL 50 MG/ML IJ SOLN: 12.5000 mg | INTRAMUSCULAR | Status: DC | PRN

## 2017-10-15 MED ORDER — SOD CITRATE-CITRIC ACID 500-334 MG/5ML PO SOLN: 30.0000 mL | ORAL | Status: DC | PRN

## 2017-10-15 MED ORDER — ONDANSETRON HCL 4 MG/2ML IJ SOLN: 4.0000 mg | Freq: Four times a day (QID) | INTRAMUSCULAR | Status: DC | PRN

## 2017-10-15 MED ORDER — OXYTOCIN BOLUS FROM INFUSION
500.0000 mL | Freq: Once | INTRAVENOUS | Status: AC
  Administered 2017-10-17: 500 mL via INTRAVENOUS

## 2017-10-15 MED ORDER — PENICILLIN G POTASSIUM 5000000 UNITS IJ SOLR
5.0000 10*6.[IU] | Freq: Once | INTRAMUSCULAR | Status: AC
  Administered 2017-10-16: 5 10*6.[IU] via INTRAVENOUS
  Filled 2017-10-15: qty 5

## 2017-10-15 MED ORDER — LIDOCAINE HCL (PF) 1 % IJ SOLN
30.0000 mL | INTRAMUSCULAR | Status: DC | PRN
  Filled 2017-10-15: qty 30

## 2017-10-15 MED ORDER — MISOPROSTOL 25 MCG QUARTER TABLET
25.0000 ug | ORAL_TABLET | ORAL | Status: DC | PRN
  Filled 2017-10-15 (×2): qty 1

## 2017-10-15 MED ORDER — EPHEDRINE 5 MG/ML INJ
10.0000 mg | INTRAVENOUS | Status: DC | PRN
  Filled 2017-10-15: qty 2

## 2017-10-15 MED ORDER — LACTATED RINGERS IV SOLN: 500.0000 mL | INTRAVENOUS | Status: DC | PRN

## 2017-10-15 MED ORDER — FENTANYL 2.5 MCG/ML BUPIVACAINE 1/10 % EPIDURAL INFUSION (WH - ANES): 14.0000 mL/h | INTRAMUSCULAR | Status: DC | PRN

## 2017-10-15 MED ORDER — OXYCODONE-ACETAMINOPHEN 5-325 MG PO TABS
1.0000 | ORAL_TABLET | ORAL | Status: DC | PRN
Start: 2017-10-15 — End: 2017-10-17

## 2017-10-15 MED ORDER — FLEET ENEMA 7-19 GM/118ML RE ENEM: 1.0000 | ENEMA | RECTAL | Status: DC | PRN

## 2017-10-15 MED ORDER — LACTATED RINGERS IV SOLN: 500.0000 mL | Freq: Once | INTRAVENOUS | Status: DC

## 2017-10-15 MED ORDER — TERBUTALINE SULFATE 1 MG/ML IJ SOLN
0.2500 mg | Freq: Once | INTRAMUSCULAR | Status: DC | PRN
  Filled 2017-10-15: qty 1

## 2017-10-15 MED ORDER — OXYCODONE-ACETAMINOPHEN 5-325 MG PO TABS: 2.0000 | ORAL_TABLET | ORAL | Status: DC | PRN

## 2017-10-15 NOTE — MAU Note (Signed)
Contractions since 0900 which are stronger. Some pelvic pressure with urge to push. Some mucousy d/c. 2.5cm on Weds

## 2017-10-15 NOTE — MAU Provider Note (Signed)
History     CSN: [id number]  Arrival date and time: 10/15/17 2114   First Provider Initiated Contact with Patient 10/15/17 2206      Chief Complaint  Patient presents with  . Contractions   HPI Ms. Tanya Moreno is a 26 y.o. G3P2002 at 2921w1d who presents to MAU today with complaint of contractions. The patient was found to have elevated blood pressures upon arrival. She complains of headache, blurred vision and RUQ abdominal pain. She denies floaters or edema. She reports normal fetal movement and mucous discharge. She denies bleeding or LOF. She has fetal macrosomia with EFW at 6976w4d of 4000g and 90%, AC > 97%.   OB History    Gravida Para Term Preterm AB Living   3 2 2     2    SAB TAB Ectopic Multiple Live Births           2      Past Medical History:  Diagnosis Date  . Chlamydia   . Genital HSV    Last outbreak Nov 2013  . Gonorrhea   . Trichomonas     Past Surgical History:  Procedure Laterality Date  . NO PAST SURGERIES      Family History  Problem Relation Age of Onset  . Heart disease Father   . Kidney disease Maternal Aunt     Social History   Tobacco Use  . Smoking status: Former Smoker    Last attempt to quit: 2015    Years since quitting: 3.8  . Smokeless tobacco: Former NeurosurgeonUser    Quit date: 11/10/2014  Substance Use Topics  . Alcohol use: No    Alcohol/week: 0.0 oz  . Drug use: No    Allergies:  Allergies  Allergen Reactions  . Latex Swelling    Mainly latex condoms  . Tape Itching    Medications Prior to Admission  Medication Sig Dispense Refill Last Dose  . Doxylamine-Pyridoxine (DICLEGIS) 10-10 MG TBEC Take 2 tablets by mouth at bedtime. 1 tab in AM, 1 tab mid afternoon 2 tabs at bedtime. Max dose 4 tabs daily 100 tablet 5 10/15/2017 at Unknown time  . Prenatal Vit-Fe Phos-FA-Omega (VITAFOL GUMMIES) 3.33-0.333-34.8 MG CHEW Chew 3 tablets by mouth daily before breakfast. 90 tablet 11 10/15/2017 at Unknown time  . Elastic Bandages &  Supports (COMFORT FIT MATERNITY SUPP MED) MISC Wear daily when ambulating 1 each 0 Taking  . Elastic Bandages & Supports (WRIST SPLINT/COCK-UP/LEFT L) MISC 1 Device by Does not apply route daily. 1 each 0 Taking  . Elastic Bandages & Supports (WRIST SPLINT/COCK-UP/RIGHT L) MISC 1 Device by Does not apply route daily. 1 each 0 Taking  . valACYclovir (VALTREX) 500 MG tablet Take 1 tablet (500 mg total) by mouth daily. 30 tablet 1 Taking    Review of Systems  Constitutional: Negative for fever.  Eyes: Positive for visual disturbance.  Cardiovascular: Negative for leg swelling.  Gastrointestinal: Positive for abdominal pain.  Genitourinary: Negative for vaginal bleeding and vaginal discharge.  Neurological: Positive for headaches.   Physical Exam   Blood pressure (!) 145/83, pulse 84, temperature 98.1 F (36.7 C), resp. rate 18, height 5\' 4"  (1.626 m), weight 266 lb (120.7 kg), last menstrual period 12/02/2016.  Physical Exam  Nursing note and vitals reviewed. Constitutional: She is oriented to person, place, and time. She appears well-developed and well-nourished. No distress.  HENT:  Head: Normocephalic and atraumatic.  Cardiovascular: Normal rate.  Respiratory: Effort normal.  GI: Soft. She  exhibits no distension and no mass. There is tenderness (mild RUQ abdominal tenderness to palpation). There is no rebound and no guarding.  Musculoskeletal: She exhibits no edema.  Neurological: She is alert and oriented to person, place, and time. She has normal reflexes.  No clonus  Skin: Skin is warm and dry. No erythema.  Psychiatric: She has a normal mood and affect.    Results for orders placed or performed during the hospital encounter of 10/15/17 (from the past 24 hour(s))  Urinalysis, Routine w reflex microscopic     Status: Abnormal   Collection Time: 10/15/17  9:45 PM  Result Value Ref Range   Color, Urine YELLOW YELLOW   APPearance CLOUDY (A) CLEAR   Specific Gravity, Urine 1.011  1.005 - 1.030   pH 6.0 5.0 - 8.0   Glucose, UA 150 (A) NEGATIVE mg/dL   Hgb urine dipstick NEGATIVE NEGATIVE   Bilirubin Urine NEGATIVE NEGATIVE   Ketones, ur NEGATIVE NEGATIVE mg/dL   Protein, ur NEGATIVE NEGATIVE mg/dL   Nitrite NEGATIVE NEGATIVE   Leukocytes, UA LARGE (A) NEGATIVE   RBC / HPF 0-5 0 - 5 RBC/hpf   WBC, UA TOO NUMEROUS TO COUNT 0 - 5 WBC/hpf   Bacteria, UA RARE (A) NONE SEEN   Squamous Epithelial / LPF 6-30 (A) NONE SEEN   Mucus PRESENT   Protein / creatinine ratio, urine     Status: Abnormal   Collection Time: 10/15/17  9:45 PM  Result Value Ref Range   Creatinine, Urine 78.00 mg/dL   Total Protein, Urine 13 mg/dL   Protein Creatinine Ratio 0.17 (H) 0.00 - 0.15 mg/mg[Cre]  CBC with Differential/Platelet     Status: Abnormal   Collection Time: 10/15/17 10:20 PM  Result Value Ref Range   WBC 8.7 4.0 - 10.5 K/uL   RBC 3.86 (L) 3.87 - 5.11 MIL/uL   Hemoglobin 11.5 (L) 12.0 - 15.0 g/dL   HCT 09.833.9 (L) 11.936.0 - 14.746.0 %   MCV 87.8 78.0 - 100.0 fL   MCH 29.8 26.0 - 34.0 pg   MCHC 33.9 30.0 - 36.0 g/dL   RDW 82.913.1 56.211.5 - 13.015.5 %   Platelets 232 150 - 400 K/uL   Neutrophils Relative % 49 %   Neutro Abs 4.3 1.7 - 7.7 K/uL   Lymphocytes Relative 37 %   Lymphs Abs 3.2 0.7 - 4.0 K/uL   Monocytes Relative 13 %   Monocytes Absolute 1.1 (H) 0.1 - 1.0 K/uL   Eosinophils Relative 1 %   Eosinophils Absolute 0.1 0.0 - 0.7 K/uL   Basophils Relative 0 %   Basophils Absolute 0.0 0.0 - 0.1 K/uL  Comprehensive metabolic panel     Status: Abnormal   Collection Time: 10/15/17 10:20 PM  Result Value Ref Range   Sodium 134 (L) 135 - 145 mmol/L   Potassium 3.2 (L) 3.5 - 5.1 mmol/L   Chloride 104 101 - 111 mmol/L   CO2 21 (L) 22 - 32 mmol/L   Glucose, Bld 114 (H) 65 - 99 mg/dL   BUN <5 (L) 6 - 20 mg/dL   Creatinine, Ser 8.650.51 0.44 - 1.00 mg/dL   Calcium 9.2 8.9 - 78.410.3 mg/dL   Total Protein 6.7 6.5 - 8.1 g/dL   Albumin 2.9 (L) 3.5 - 5.0 g/dL   AST 26 15 - 41 U/L   ALT 18 14 - 54 U/L    Alkaline Phosphatase 158 (H) 38 - 126 U/L   Total Bilirubin 0.6 0.3 - 1.2  mg/dL   GFR calc non Af Amer >60 >60 mL/min   GFR calc Af Amer >60 >60 mL/min   Anion gap 9 5 - 15     Fetal Monitoring: Baseline: 130 bpm Variability: moderate Accelerations: 15 x 15 Decelerations: none Contractions: few, irregular with moderate UI   Patient Vitals for the past 24 hrs:  BP Temp Pulse Resp Height Weight  10/15/17 2300 (!) 145/83 - 84 - - -  10/15/17 2246 136/82 - 83 - - -  10/15/17 2231 (!) 130/105 - 82 - - -  10/15/17 2216 (!) 148/87 - 95 - - -  10/15/17 2204 (!) 142/78 - 84 - - -  10/15/17 2155 135/83 - (!) 108 - - -  10/15/17 2150 (!) 143/78 - 84 - - -  10/15/17 2128 (!) 142/83 98.1 F (36.7 C) 82 18 5\' 4"  (1.626 m) 266 lb (120.7 kg)    MAU Course  Procedures None  MDM CBC, CMP, UA and Urine protein/creatinine ratio Discussed patient with Dr. Despina Hidden. Admit for IOL for GHTN.   Assessment and Plan  A: [facility] at [redacted]w[redacted]d Gestation HTN  P: Admit for induction of labor Cytotec   Vonzella Nipple, PA-C 10/15/2017, 11:25 PM

## 2017-10-16 ENCOUNTER — Encounter (HOSPITAL_COMMUNITY): Payer: Self-pay | Admitting: Certified Registered Nurse Anesthetist

## 2017-10-16 ENCOUNTER — Encounter (HOSPITAL_COMMUNITY): Payer: Self-pay

## 2017-10-16 LAB — TYPE AND SCREEN
ABO/RH(D): O POS
Antibody Screen: NEGATIVE

## 2017-10-16 LAB — RPR: RPR: NONREACTIVE

## 2017-10-16 MED ORDER — MISOPROSTOL 200 MCG PO TABS
50.0000 ug | ORAL_TABLET | ORAL | Status: DC | PRN
  Administered 2017-10-16 (×3): 50 ug via BUCCAL
  Filled 2017-10-16 (×3): qty 1

## 2017-10-16 MED ORDER — TERBUTALINE SULFATE 1 MG/ML IJ SOLN
0.2500 mg | Freq: Once | INTRAMUSCULAR | Status: DC | PRN
  Filled 2017-10-16: qty 1

## 2017-10-16 MED ORDER — OXYTOCIN 40 UNITS IN LACTATED RINGERS INFUSION - SIMPLE MED
1.0000 m[IU]/min | INTRAVENOUS | Status: DC
  Administered 2017-10-16: 2 m[IU]/min via INTRAVENOUS
  Filled 2017-10-16: qty 1000

## 2017-10-16 MED ORDER — INFLUENZA VAC SPLIT QUAD 0.5 ML IM SUSY
0.5000 mL | PREFILLED_SYRINGE | INTRAMUSCULAR | Status: DC
  Filled 2017-10-16: qty 0.5

## 2017-10-16 MED ORDER — FENTANYL CITRATE (PF) 100 MCG/2ML IJ SOLN
100.0000 ug | INTRAMUSCULAR | Status: DC | PRN
  Administered 2017-10-16 (×3): 100 ug via INTRAVENOUS
  Filled 2017-10-16 (×3): qty 2

## 2017-10-16 NOTE — H&P (Signed)
Obstetric History and Physical  Tanya Moreno is a 26 y.o. [id number] with IUP at 5516w2d presenting for IOL for gHTN. Went to [facility] for labor check and found to have elevated BPs. Patient has prenatal history complicated by macrosomia. Patient states she has been having  irregular, every 8-15 minutes contractions, none vaginal bleeding, intact membranes, with active fetal movement.    No blurry vision, headaches or peripheral edema, and RUQ pain.   Prenatal Course Source of Care: [place] with onset of care at 13 weeks Dating: By US --->  Estimated Date of Delivery: 10/14/17 Pregnancy complications or risks: Patient Active Problem List   Diagnosis Date Noted  . Gestational hypertension 10/15/2017  . LGA (large for gestational age) fetus, third trimester 10/04/2017  . Positive GBS test 09/24/2017  . Carpal tunnel syndrome during pregnancy 08/24/2017  . History of HSV 04/14/2017  . BMI 40.0-44.9, adult (HCC) 04/14/2017  . Obesity in pregnancy, antepartum 04/14/2017  . Supervision of other normal pregnancy, antepartum 04/09/2017  . Chlamydia infection affecting pregnancy in first trimester 02/09/2017  . Latex allergy 08/26/2011  Chlamydia in early pregnancy treated -> TOC negative  She plans to bottle feed She desires Depo-Provera for postpartum contraception.   Sono:    EFW at 6054w4d of 4000g and 90%, AC > 97%, CWD, normal anatomy, cephalic presentation, anterior placenta; macrosomia  Prenatal labs and studies: ABO, Rh: --/--/O POS (11/09 2220) Antibody: NEG (11/09 2220) Rubella: 1.62 (05/04 1036) RPR: Non Reactive (08/20 1202)  HBsAg: Negative (05/04 1036)  HIV:  Non-reactive AVW:UJWJXBJYGBS:Positive (10/17 1602) 2 hr Glucola  normal Genetic screening was not able to be resulted Anatomy US normal  Prenatal Transfer Tool  Maternal Diabetes: No Genetic Screening: not able to be resulted Maternal Ultrasounds/Referrals: Normal Fetal Ultrasounds or other Referrals:  None Maternal Substance Abuse:   No Significant Maternal Medications:  None Significant Maternal Lab Results: Lab values include: Group B Strep positive  Past Medical History:  Diagnosis Date  . Chlamydia   . Genital HSV    Last outbreak Nov 2013  . Gonorrhea   . Trichomonas     Past Surgical History:  Procedure Laterality Date  . NO PAST SURGERIES      OB History  Gravida Para Term Preterm AB Living  3 2 2     2   SAB TAB Ectopic Multiple Live Births          2    # Outcome Date GA Lbr Len/2nd Weight Sex Delivery Anes PTL Lv  3 Current           2 Term 04/12/13 8229w4d 16:45 / 00:09 3.045 kg (6 lb 11.4 oz) F Vag-Spont None  LIV  1 Term 11/01/11 4716w2d 16:25 / 00:04 3.116 kg (6 lb 13.9 oz) M Vag-Spont None  LIV     Birth Comments: none      Social History   Socioeconomic History  . Marital status: Single    Spouse name: None  . Number of children: None  . Years of education: None  . Highest education level: None  Social Needs  . Financial resource strain: None  . Food insecurity - worry: None  . Food insecurity - inability: None  . Transportation needs - medical: None  . Transportation needs - non-medical: None  Occupational History  . None  Tobacco Use  . Smoking status: Former Smoker    Last attempt to quit: 2015    Years since quitting: 3.8  . Smokeless tobacco: Former  User    Quit date: 11/10/2014  Substance and Sexual Activity  . Alcohol use: No    Alcohol/week: 0.0 oz  . Drug use: No  . Sexual activity: Yes    Partners: Male    Birth control/protection: None  Other Topics Concern  . None  Social History Narrative   Lives with godmother.     Family History  Problem Relation Age of Onset  . Heart disease Father   . Kidney disease Maternal Aunt     Medications Prior to Admission  Medication Sig Dispense Refill Last Dose  . Doxylamine-Pyridoxine (DICLEGIS) 10-10 MG TBEC Take 2 tablets by mouth at bedtime. 1 tab in AM, 1 tab mid afternoon 2 tabs at bedtime. Max dose 4 tabs daily  100 tablet 5 10/15/2017 at Unknown time  . Prenatal Vit-Fe Phos-FA-Omega (VITAFOL GUMMIES) 3.33-0.333-34.8 MG CHEW Chew 3 tablets by mouth daily before breakfast. 90 tablet 11 10/15/2017 at Unknown time  . Elastic Bandages & Supports (COMFORT FIT MATERNITY SUPP MED) MISC Wear daily when ambulating 1 each 0 Taking  . Elastic Bandages & Supports (WRIST SPLINT/COCK-UP/LEFT L) MISC 1 Device by Does not apply route daily. 1 each 0 Taking  . Elastic Bandages & Supports (WRIST SPLINT/COCK-UP/RIGHT L) MISC 1 Device by Does not apply route daily. 1 each 0 Taking  . valACYclovir (VALTREX) 500 MG tablet Take 1 tablet (500 mg total) by mouth daily. 30 tablet 1 Taking    Allergies  Allergen Reactions  . Latex Swelling    Mainly latex condoms  . Tape Itching    Review of Systems: Negative except for what is mentioned in HPI.  Physical Exam: BP (!) 147/82   Pulse 84   Temp 98.1 F (36.7 C)   Resp 16   Ht 5\' 4"  (1.626 m)   Wt 120.7 kg (266 lb)   LMP 12/02/2016   BMI 45.66 kg/m  CONSTITUTIONAL: Well-developed, well-nourished female in no acute distress.  HENT:  Normocephalic, atraumatic, External right and left ear normal. Oropharynx is clear and moist EYES: Conjunctivae and EOM are normal. Pupils are equal, round, and reactive to light. No scleral icterus.  NECK: Normal range of motion, supple, no masses SKIN: Skin is warm and dry. No rash noted. Not diaphoretic. No erythema. No pallor. NEUROLOGIC: Alert and oriented to person, place, and time. Normal reflexes, muscle tone coordination. No cranial nerve deficit noted. PSYCHIATRIC: Normal mood and affect. Normal behavior. Normal judgment and thought content. CARDIOVASCULAR: Normal heart rate noted, regular rhythm RESPIRATORY: Effort and breath sounds normal, no problems with respiration noted ABDOMEN: Soft, nontender, nondistended, gravid. MUSCULOSKELETAL: Normal range of motion. No edema and no tenderness. 2+ distal pulses.  Dilation:  Fingertip Effacement (%): Thick Cervical Position: Posterior Station: Ballotable Presentation: Vertex Exam by:: Dr.Phelps  Presentation: cephalic FHT:  Baseline rate 130 bpm   Variability moderate  Accelerations present   Decelerations none Contractions: Every 3-9 mins   Pertinent Labs/Studies:   Results for orders placed or performed during the hospital encounter of 10/15/17 (from the past 24 hour(s))  Urinalysis, Routine w reflex microscopic     Status: Abnormal   Collection Time: 10/15/17  9:45 PM  Result Value Ref Range   Color, Urine YELLOW YELLOW   APPearance CLOUDY (A) CLEAR   Specific Gravity, Urine 1.011 1.005 - 1.030   pH 6.0 5.0 - 8.0   Glucose, UA 150 (A) NEGATIVE mg/dL   Hgb urine dipstick NEGATIVE NEGATIVE   Bilirubin Urine NEGATIVE NEGATIVE   Ketones,  ur NEGATIVE NEGATIVE mg/dL   Protein, ur NEGATIVE NEGATIVE mg/dL   Nitrite NEGATIVE NEGATIVE   Leukocytes, UA LARGE (A) NEGATIVE   RBC / HPF 0-5 0 - 5 RBC/hpf   WBC, UA TOO NUMEROUS TO COUNT 0 - 5 WBC/hpf   Bacteria, UA RARE (A) NONE SEEN   Squamous Epithelial / LPF 6-30 (A) NONE SEEN   Mucus PRESENT   Protein / creatinine ratio, urine     Status: Abnormal   Collection Time: 10/15/17  9:45 PM  Result Value Ref Range   Creatinine, Urine 78.00 mg/dL   Total Protein, Urine 13 mg/dL   Protein Creatinine Ratio 0.17 (H) 0.00 - 0.15 mg/mg[Cre]  CBC with Differential/Platelet     Status: Abnormal   Collection Time: 10/15/17 10:20 PM  Result Value Ref Range   WBC 8.7 4.0 - 10.5 K/uL   RBC 3.86 (L) 3.87 - 5.11 MIL/uL   Hemoglobin 11.5 (L) 12.0 - 15.0 g/dL   HCT 16.1 (L) 09.6 - 04.5 %   MCV 87.8 78.0 - 100.0 fL   MCH 29.8 26.0 - 34.0 pg   MCHC 33.9 30.0 - 36.0 g/dL   RDW 40.9 81.1 - 91.4 %   Platelets 232 150 - 400 K/uL   Neutrophils Relative % 49 %   Neutro Abs 4.3 1.7 - 7.7 K/uL   Lymphocytes Relative 37 %   Lymphs Abs 3.2 0.7 - 4.0 K/uL   Monocytes Relative 13 %   Monocytes Absolute 1.1 (H) 0.1 - 1.0 K/uL    Eosinophils Relative 1 %   Eosinophils Absolute 0.1 0.0 - 0.7 K/uL   Basophils Relative 0 %   Basophils Absolute 0.0 0.0 - 0.1 K/uL  Comprehensive metabolic panel     Status: Abnormal   Collection Time: 10/15/17 10:20 PM  Result Value Ref Range   Sodium 134 (L) 135 - 145 mmol/L   Potassium 3.2 (L) 3.5 - 5.1 mmol/L   Chloride 104 101 - 111 mmol/L   CO2 21 (L) 22 - 32 mmol/L   Glucose, Bld 114 (H) 65 - 99 mg/dL   BUN <5 (L) 6 - 20 mg/dL   Creatinine, Ser 7.82 0.44 - 1.00 mg/dL   Calcium 9.2 8.9 - 95.6 mg/dL   Total Protein 6.7 6.5 - 8.1 g/dL   Albumin 2.9 (L) 3.5 - 5.0 g/dL   AST 26 15 - 41 U/L   ALT 18 14 - 54 U/L   Alkaline Phosphatase 158 (H) 38 - 126 U/L   Total Bilirubin 0.6 0.3 - 1.2 mg/dL   GFR calc non Af Amer >60 >60 mL/min   GFR calc Af Amer >60 >60 mL/min   Anion gap 9 5 - 15  Type and screen [facility]     Status: None   Collection Time: 10/15/17 10:20 PM  Result Value Ref Range   ABO/RH(D) O POS    Antibody Screen NEG    Sample Expiration 10/18/2017     Assessment : Tanya Moreno is a 26 y.o. G3P2002 at [redacted]w[redacted]d being admitted for induction of labor due to gHTN. Pregnancy complicated by macrosomia.  Plan: Labor: Induction with Cytotec 50 buccally.  Not currently in labor.  Analgesia as needed. Patient wants to do this without epidural.  Counseled on possible shoulder dystocia. gHTN - PIH labs wnl, asymptomatic, continue to monitor FWB: Reassuring fetal heart tracing.   GBS positive - start PCN Delivery plan: Hopeful for vaginal delivery   Caryl Ada, DO OB  Fellow Faculty Practice, [facility] 10/16/2017, 12:41 AM

## 2017-10-16 NOTE — Anesthesia Pain Management Evaluation Note (Signed)
  [facility] Pain Management Visit Note  Patient: Tanya Moreno AnesCierra S [place], 26 y.o., female  "Hello I am a member of the anesthesia team at [facility]. We have an anesthesia team available at all times to provide care throughout the hospital, including epidural management and anesthesia for C-section. I don't know your plan for the delivery whether it a natural birth, water birth, IV sedation, nitrous supplementation, doula or epidural, but we want to meet your pain goals."   1.Was your pain managed to your expectations on prior hospitalizations?   Yes   2.What is your expectation for pain management during this hospitalization?     IV pain meds  3.How can we help you reach that goal?   Record the patient's initial score and the patient's pain goal.   Pain: 7  Pain Goal: 9 [facility] wants you to be able to say your pain was always managed very well.  Laban EmperorMalinova,Jaja Switalski Hristova 10/16/2017

## 2017-10-16 NOTE — Progress Notes (Signed)
Labor Progress Note Tanya Moreno is a 26 y.o. G3P2002 at 5939w2d presented for IOL for gHTN S: Pt sleeping.  O:  BP (!) 114/57   Pulse 78   Temp 98.3 F (36.8 C) (Oral)   Resp 16   Ht 5\' 4"  (1.626 m)   Wt 266 lb (120.7 kg)   LMP 12/02/2016   BMI 45.66 kg/m  EFM:130 / mod vari/ +accels  CVE: Dilation: Fingertip Effacement (%): Thick Cervical Position: Posterior Station: Ballotable Presentation: Undeterminable Exam by:: L.STUBBS, RN   A&P: 26 y.o. [id number] 3539w2d IOL for gHTN #Labor: Has received cytotec x 2. At recheck in 4 hrs consider fb. #Pain: per patient request #FWB: cat 1 #GBS positive (pcn) #gHTN: BP well managed at 114/57  Suella BroadKeriann S Dandra Shambaugh, MD 6:44 AM

## 2017-10-16 NOTE — Progress Notes (Signed)
Patient ID: Lillard AnesCierra S Barlowe, female   DOB: Aug 08, 1991, 26 y.o.   MRN: [id number] I was asked to place a foley bulb in this patient She was eating and apparently comfortable  Vitals:   10/16/17 1204 10/16/17 1301 10/16/17 1401 10/16/17 1508  BP: (!) 142/86 132/75 140/89 138/80  Pulse: 75 80 75 67  Resp: 20 20 18 18   Temp:      TempSrc:      Weight:      Height:       FHR stable  Dilation: 2.5 Effacement (%): 70 Cervical Position: Posterior Station: -1, -2 Presentation: Undeterminable Exam by:: Artelia LarocheM Brigett Estell  Foley inserted

## 2017-10-16 NOTE — Progress Notes (Signed)
Labor Progress Note Lillard AnesCierra S Linhart is a 26 y.o. G3P2002 at 9037w2d presented for IOL for gHTN S: Patient feeling more uncomforable  O:  BP (!) 144/80   Pulse 82   Temp 98.7 F (37.1 C) (Oral)   Resp 18   Ht 5\' 4"  (1.626 m)   Wt 266 lb (120.7 kg)   LMP 12/02/2016   BMI 45.66 kg/m  EFM: 150/mod var/no decels  CVE: Dilation: 6 Effacement (%): 70 Cervical Position: Middle Station: -1 Presentation: Vertex Exam by:: Dr. Rachelle HoraMoss   A&P: 26 y.o. [id number] 837w2d here for IOL for gHTN #Labor: Progressing well. AROM at 2045, clear fluid. Continue pitocin. FSE placed due to difficulty tracing FHT. #Pain: not planning epidural  #FWB: cat 1  Maahir Horst, DO 8:52 PM

## 2017-10-17 ENCOUNTER — Encounter (HOSPITAL_COMMUNITY): Payer: Self-pay | Admitting: *Deleted

## 2017-10-17 MED ORDER — SENNOSIDES-DOCUSATE SODIUM 8.6-50 MG PO TABS
2.0000 | ORAL_TABLET | ORAL | Status: DC
  Filled 2017-10-17 (×2): qty 2

## 2017-10-17 MED ORDER — DIPHENHYDRAMINE HCL 25 MG PO CAPS: 25.0000 mg | ORAL_CAPSULE | Freq: Four times a day (QID) | ORAL | Status: DC | PRN

## 2017-10-17 MED ORDER — PRENATAL MULTIVITAMIN CH
1.0000 | ORAL_TABLET | Freq: Every day | ORAL | Status: DC
  Administered 2017-10-17: 1 via ORAL
  Filled 2017-10-17 (×2): qty 1

## 2017-10-17 MED ORDER — BENZOCAINE-MENTHOL 20-0.5 % EX AERO: 1.0000 "application " | INHALATION_SPRAY | CUTANEOUS | Status: DC | PRN

## 2017-10-17 MED ORDER — ONDANSETRON HCL 4 MG PO TABS: 4.0000 mg | ORAL_TABLET | ORAL | Status: DC | PRN

## 2017-10-17 MED ORDER — INFLUENZA VAC SPLIT QUAD 0.5 ML IM SUSY: 0.5000 mL | PREFILLED_SYRINGE | INTRAMUSCULAR | Status: DC

## 2017-10-17 MED ORDER — ONDANSETRON HCL 4 MG/2ML IJ SOLN: 4.0000 mg | INTRAMUSCULAR | Status: DC | PRN

## 2017-10-17 MED ORDER — DIBUCAINE 1 % RE OINT: 1.0000 "application " | TOPICAL_OINTMENT | RECTAL | Status: DC | PRN

## 2017-10-17 MED ORDER — IBUPROFEN 600 MG PO TABS
600.0000 mg | ORAL_TABLET | Freq: Four times a day (QID) | ORAL | Status: DC
  Administered 2017-10-17 – 2017-10-18 (×2): 600 mg via ORAL
  Filled 2017-10-17 (×7): qty 1

## 2017-10-17 MED ORDER — ZOLPIDEM TARTRATE 5 MG PO TABS: 5.0000 mg | ORAL_TABLET | Freq: Every evening | ORAL | Status: DC | PRN

## 2017-10-17 MED ORDER — SIMETHICONE 80 MG PO CHEW: 80.0000 mg | CHEWABLE_TABLET | ORAL | Status: DC | PRN

## 2017-10-17 MED ORDER — WITCH HAZEL-GLYCERIN EX PADS: 1.0000 "application " | MEDICATED_PAD | CUTANEOUS | Status: DC | PRN

## 2017-10-17 MED ORDER — COCONUT OIL OIL: 1.0000 "application " | TOPICAL_OIL | Status: DC | PRN

## 2017-10-17 MED ORDER — ACETAMINOPHEN 325 MG PO TABS: 650.0000 mg | ORAL_TABLET | ORAL | Status: DC | PRN

## 2017-10-17 MED ORDER — TETANUS-DIPHTH-ACELL PERTUSSIS 5-2.5-18.5 LF-MCG/0.5 IM SUSP: 0.5000 mL | Freq: Once | INTRAMUSCULAR | Status: DC

## 2017-10-17 NOTE — Plan of Care (Signed)
Patient alternating rest with periods of activity ambulating in her room and caring for newborn appropriately. She denies discomforts. She is responding to her infant's needs appropriately.

## 2017-10-18 ENCOUNTER — Encounter: Payer: Medicaid Other | Admitting: Obstetrics

## 2017-10-18 LAB — CULTURE, OB URINE

## 2017-10-18 NOTE — Progress Notes (Signed)
POSTPARTUM PROGRESS NOTE  Post Partum Day 1 Subjective:  Tanya Moreno is a [age] y.o. G3P3003 3366w3d s/p SVD.  No acute events overnight.  Pt denies problems with ambulating, voiding or po intake.  She denies nausea or vomiting.  Pain is well controlled. Lochia Minimal.   Objective: Blood pressure 127/63, pulse 70, temperature 98.5 F (36.9 C), temperature source Oral, resp. rate 16, height 5\' 4"  (1.626 m), weight 266 lb (120.7 kg), last menstrual period 12/02/2016, SpO2 100 %, unknown if currently breastfeeding.  Physical Exam:  General: alert, cooperative and no distress Lochia:normal flow Chest: no respiratory distress Heart:regular rate, distal pulses intact Abdomen: soft, nontender,  Uterine Fundus: firm, appropriately tender DVT Evaluation: No calf swelling or tenderness Extremities: no edema  Recent Labs    10/15/17 2220  HGB 11.5*  HCT 33.9*    Assessment/Plan:  ASSESSMENT: Tanya Moreno is a 26 y.o. G3P3003 2766w3d s/p SVD.  Plan for discharge tomorrow   LOS: 3 days   Debroah Shuttleworth MossMD 10/18/2017, 8:14 AM

## 2017-10-19 DIAGNOSIS — O99824 Streptococcus B carrier state complicating childbirth: Secondary | ICD-10-CM

## 2017-10-19 DIAGNOSIS — Z3A40 40 weeks gestation of pregnancy: Secondary | ICD-10-CM

## 2017-10-19 DIAGNOSIS — O134 Gestational [pregnancy-induced] hypertension without significant proteinuria, complicating childbirth: Secondary | ICD-10-CM

## 2017-10-19 NOTE — Discharge Summary (Signed)
OB Discharge Summary     Patient Name: Tanya Moreno DOB: [birth year] MRN: [id number] Date of admission: 10/15/2017  Delivering MD: Rolm BookbinderMOSS, Harlee Eckroth )  Date of discharge: 10/19/2017    Admitting diagnosis: gHTN, pregnancy at 40 weeks Intrauterine pregnancy: 2037w3d    Secondary diagnosis:  Active Problems:   Patient Active Problem List   Diagnosis Date Noted  . Gestational hypertension 10/15/2017  . LGA (large for gestational age) fetus, third trimester 10/04/2017  . Positive GBS test 09/24/2017  . Carpal tunnel syndrome during pregnancy 08/24/2017  . History of HSV 04/14/2017  . BMI 40.0-44.9, adult (HCC) 04/14/2017  . Obesity in pregnancy, antepartum 04/14/2017  . Supervision of other normal pregnancy, antepartum 04/09/2017  . Chlamydia infection affecting pregnancy in first trimester 02/09/2017  . NSVD (normal spontaneous vaginal delivery) 04/14/2013  . Latex allergy 08/26/2011    Additional problems: none     Discharge diagnosis: Term Pregnancy Delivered                                                                                                Post partum procedures:none  Complications: None  Hospital course:  Induction of Labor With Vaginal Delivery   26 y.o. yo G3P3003 at 5437w3d was admitted to the hospital 10/15/2017 for induction of labor.  Indication for induction: Gestational hypertension.  Patient had an uncomplicated labor course as follows: Membrane Rupture Time/Date: 8:46 PM ,10/16/2017   Intrapartum Procedures: Episiotomy: None [1]                                         Lacerations:  None [1]  Patient had delivery of a Viable infant.  Information for the patient's newborn:  Tanya Moreno [[id number]][[id number]]  Delivery Method: Vaginal, Spontaneous(Filed from Delivery Summary)   10/17/2017  Details of delivery can be found in separate delivery note.  Patient had a routine postpartum course. Patient is discharged home 10/19/17.  Physical exam  Vitals:   10/18/17 1818 10/19/17 0533  BP: 130/71 (!) 113/57  Pulse: 84 69  Resp: 17 18  Temp: 98.7 F (37.1 C) 98.5 F (36.9 C)  SpO2:      General: alert, cooperative and no distress Lochia: appropriate Uterine Fundus: firm Incision: N/A DVT Evaluation: No evidence of DVT seen on physical exam.  Labs: No results found for this or any previous visit (from the past 24 hour(s)).   Discharge instruction: per After Visit Summary and "Baby and Me Booklet".  After visit meds:  Allergies  Allergen Reactions  . Latex Swelling    Mainly latex condoms  . Tape Itching    Allergies as of 10/19/2017      Reactions   Latex Swelling   Mainly latex condoms   Tape Itching      Medication List    STOP taking these medications   COMFORT FIT MATERNITY SUPP MED Misc   Doxylamine-Pyridoxine 10-10 MG Tbec Commonly known as:  DICLEGIS   valACYclovir 500 MG tablet Commonly known as:  VALTREX  VITAFOL GUMMIES 3.33-0.333-34.8 MG Chew   Wrist Splint/Cock-Up/Left L Misc   Wrist Splint/Cock-Up/Right L Misc        Diet: routine diet  Activity: Advance as tolerated. Pelvic rest for 6 weeks.   Outpatient follow up:1 week, baby love ordered  Postpartum contraception: Depo Provera  Newborn Data: APGAR (1 MIN): 5   APGAR (5 MINS): 9     Baby Feeding: Bottle Disposition:home with mother  Rolm Bookbindermber Shneur [place], DO  10/19/2017

## 2017-10-23 ENCOUNTER — Inpatient Hospital Stay (HOSPITAL_COMMUNITY): Payer: Medicaid Other

## 2017-11-06 ENCOUNTER — Other Ambulatory Visit: Payer: Self-pay

## 2017-11-06 ENCOUNTER — Encounter (HOSPITAL_COMMUNITY): Payer: Self-pay

## 2017-11-06 ENCOUNTER — Emergency Department (HOSPITAL_COMMUNITY)
Admission: EM | Admit: 2017-11-06 | Discharge: 2017-11-06 | Disposition: A | Discharge status: Home / Self Care | Payer: Medicaid Other | Attending: Emergency Medicine | Admitting: Emergency Medicine

## 2017-11-06 DIAGNOSIS — Z5321 Procedure and treatment not carried out due to patient leaving prior to being seen by health care provider: Secondary | ICD-10-CM | POA: Diagnosis not present

## 2017-11-06 DIAGNOSIS — R51 Headache: Secondary | ICD-10-CM | POA: Insufficient documentation

## 2017-11-06 NOTE — ED Triage Notes (Signed)
Pt states she has had headaches with blurry vision X2 weeks. She also reports some left leg weakness. Pt states she had baby 10/17/2017. Pt alert and oriented X4. No unilateral weakness noted.

## 2017-11-12 ENCOUNTER — Emergency Department (HOSPITAL_COMMUNITY)
Admission: EM | Admit: 2017-11-12 | Discharge: 2017-11-12 | Disposition: A | Discharge status: Home / Self Care | Payer: Medicaid Other | Attending: Emergency Medicine | Admitting: Emergency Medicine

## 2017-11-12 ENCOUNTER — Other Ambulatory Visit: Payer: Self-pay

## 2017-11-12 ENCOUNTER — Encounter (HOSPITAL_COMMUNITY): Payer: Self-pay | Admitting: Emergency Medicine

## 2017-11-12 DIAGNOSIS — Z91040 Latex allergy status: Secondary | ICD-10-CM | POA: Diagnosis not present

## 2017-11-12 DIAGNOSIS — Z87891 Personal history of nicotine dependence: Secondary | ICD-10-CM | POA: Insufficient documentation

## 2017-11-12 DIAGNOSIS — R0981 Nasal congestion: Secondary | ICD-10-CM | POA: Diagnosis present

## 2017-11-12 DIAGNOSIS — J069 Acute upper respiratory infection, unspecified: Secondary | ICD-10-CM | POA: Diagnosis not present

## 2017-11-12 MED ORDER — FLUTICASONE PROPIONATE 50 MCG/ACT NA SUSP: 1.0000 | Freq: Every day | NASAL | 0 refills | Status: DC

## 2017-11-12 MED ORDER — BENZONATATE 100 MG PO CAPS: 100.0000 mg | ORAL_CAPSULE | Freq: Three times a day (TID) | ORAL | 0 refills | Status: DC

## 2017-11-12 MED ORDER — PROMETHAZINE-DM 6.25-15 MG/5ML PO SYRP: 5.0000 mL | ORAL_SOLUTION | Freq: Three times a day (TID) | ORAL | 0 refills | Status: DC | PRN

## 2017-11-12 NOTE — ED Triage Notes (Signed)
Pt reports body aches, runny nose and sore throat x2 day. Denies fevers at home.

## 2017-11-12 NOTE — Discharge Instructions (Signed)
You likely have a viral illness.  This should be treated symptomatically. Use Tylenol or ibuprofen as needed for fevers or body aches. Use Flonase daily for nasal congestion and cough. Use tessalon perles and cough syrup as needed for cough. Make sure you stay well-hydrated with water. Wash your hands frequently to prevent spread of infection. Follow-up with your OB/GYN on Monday for recheck of your blood pressure . Return to the emergency room if you develop chest pain, difficulty breathing, numbness, slurred speech, or any new or worsening symptoms.

## 2017-11-12 NOTE — ED Provider Notes (Signed)
[facility] EMERGENCY DEPARTMENT Provider Note   CSN: [id number] Arrival date & time: 11/12/17  1535     History   Chief Complaint Chief Complaint  Patient presents with  . Influenza    HPI Tanya Moreno is a 26 y.o. female presenting for 3-day history of URI symptoms.  Patient states that for the past 3 days, she has had nasal congestion, productive cough, left ear pain, headaches, and generalized body aches.  She has been taking Tylenol and ibuprofen without relief of symptoms.  She initially had a sore throat, but this has resolved.  She states she was at a friend's house who has similar symptoms just prior to this starting.  She reports intermittent fevers at home, she is afebrile now.  Her cough is worse at night.  She denies vision changes, slurred speech, chest pain, shortness of breath, nausea, vomiting, abdominal pain, urinary symptoms, abnormal bowel movements, numbness, or weakness.  The patient had vaginal delivery on 11/13.  She has had no complications, although she was diagnosed with gestational hypertension during her pregnancy.  She is not currently taking any medication for blood pressure, and has never had issues with her blood pressure before.   HPI  Past Medical History:  Diagnosis Date  . Chlamydia   . Genital HSV    Last outbreak Nov 2013  . Gonorrhea   . Trichomonas     Patient Active Problem List   Diagnosis Date Noted  . Gestational hypertension 10/15/2017  . LGA (large for gestational age) fetus, third trimester 10/04/2017  . Positive GBS test 09/24/2017  . Carpal tunnel syndrome during pregnancy 08/24/2017  . History of HSV 04/14/2017  . BMI 40.0-44.9, adult (HCC) 04/14/2017  . Obesity in pregnancy, antepartum 04/14/2017  . Supervision of other normal pregnancy, antepartum 04/09/2017  . Chlamydia infection affecting pregnancy in first trimester 02/09/2017  . NSVD (normal spontaneous vaginal delivery) 04/14/2013  . Latex  allergy 08/26/2011    Past Surgical History:  Procedure Laterality Date  . NO PAST SURGERIES      OB History    Gravida Para Term Preterm AB Living   3 3 3     3    SAB TAB Ectopic Multiple Live Births         0 3       Home Medications    Prior to Admission medications   Medication Sig Start Date End Date Taking? Authorizing Provider  benzonatate (TESSALON) 100 MG capsule Take 1 capsule (100 mg total) by mouth every 8 (eight) hours. 11/12/17   Trulee Hamstra, PA-C  fluticasone (FLONASE) 50 MCG/ACT nasal spray Place 1 spray into both nostrils daily. 11/12/17   Aidan Moten, PA-C  promethazine-dextromethorphan (PROMETHAZINE-DM) 6.25-15 MG/5ML syrup Take 5 mLs by mouth 3 (three) times daily as needed for cough. 11/12/17   Modena Bellemare, PA-C    Family History Family History  Problem Relation Age of Onset  . Heart disease Father   . Kidney disease Maternal Aunt     Social History Social History   Tobacco Use  . Smoking status: Former Smoker    Last attempt to quit: 2015    Years since quitting: 3.9  . Smokeless tobacco: Former Neurosurgeon    Quit date: 11/10/2014  Substance Use Topics  . Alcohol use: No    Alcohol/week: 0.0 oz  . Drug use: No     Allergies   Latex and Tape   Review of Systems Review of Systems  Constitutional:  Positive for fever (subjective). Negative for chills.  HENT: Positive for congestion, ear pain, sinus pressure, sinus pain and sore throat (resolved).   Eyes: Negative for pain.  Respiratory: Positive for cough. Negative for chest tightness and stridor.   Cardiovascular: Negative for chest pain, palpitations and leg swelling.  Gastrointestinal: Negative for abdominal pain, nausea and vomiting.  Allergic/Immunologic: Negative for immunocompromised state.  Neurological: Positive for headaches. Negative for dizziness, speech difficulty and numbness.     Physical Exam Updated Vital Signs BP (!) 138/95 (BP Location: Right Arm)    Pulse 84   Temp 98.8 F (37.1 C) (Oral)   Resp 18   Ht 5\' 4"  (1.626 m)   Wt 112.5 kg (248 lb)   LMP 11/12/2017 (Exact Date)   SpO2 98%   BMI 42.57 kg/m   Physical Exam  Constitutional: She is oriented to person, place, and time. She appears well-developed and well-nourished. No distress.  HENT:  Head: Normocephalic and atraumatic.  Right Ear: Tympanic membrane, external ear and ear canal normal.  Left Ear: Tympanic membrane, external ear and ear canal normal.  Nose: Mucosal edema present. Right sinus exhibits no maxillary sinus tenderness and no frontal sinus tenderness. Left sinus exhibits maxillary sinus tenderness and frontal sinus tenderness.  Mouth/Throat: Uvula is midline, oropharynx is clear and moist and mucous membranes are normal. No tonsillar exudate.  Nasal mucosal edema, worse on left.  Tenderness palpation of maxillary and frontal left sinuses.  OP clear without tonsillar swelling or exudate.  Uvula midline with equal palate rise.  Handling secretions easily.  Eyes: Conjunctivae and EOM are normal. Pupils are equal, round, and reactive to light.  Neck: Normal range of motion.  Cardiovascular: Normal rate, regular rhythm and intact distal pulses.  Pulmonary/Chest: Effort normal and breath sounds normal. No respiratory distress. She has no decreased breath sounds. She has no wheezes. She has no rhonchi. She has no rales.  Pt speaking in full sentences.  Clear lung sounds in all fields  Abdominal: Soft. She exhibits no distension and no mass. There is no tenderness. There is no guarding.  Musculoskeletal: Normal range of motion.  Neurological: She is alert and oriented to person, place, and time.  Skin: Skin is warm.  Psychiatric: She has a normal mood and affect.  Nursing note and vitals reviewed.    ED Treatments / Results  Labs (all labs ordered are listed, but only abnormal results are displayed) Labs Reviewed - No data to display  EKG  EKG  Interpretation None       Radiology No results found.  Procedures Procedures (including critical care time)  Medications Ordered in ED Medications - No data to display   Initial Impression / Assessment and Plan / ED Course  I have reviewed the triage vital signs and the nursing notes.  Pertinent labs & imaging results that were available during my care of the patient were reviewed by me and considered in my medical decision making (see chart for details).     Patient presenting with URI symptoms.  Physical exam reassuring, patient is afebrile and appears nontoxic.  Pulmonary exam reassuring.  Doubt pneumonia, strep, other bacterial infection, or peritonsillar abscess.  I do not believe imaging is necessary today. Likely URI.  Will treat symptomatically.   Patient's blood pressure elevated upon arrival at 160/100.  On recheck, blood pressure improved at 130/100.  Discussed findings with patient.  Patient has appointment with OB/GYN scheduled for Monday.  No signs of hypertensive encephalopathy, or  end-organ damage.  Stressed importance of follow-up with OB on Monday.  Case discussed with attending, Dr. Estell HarpinZammit agrees to plan.  At this time, patient appears safe for discharge.  Return precautions given.  Patient states she understands and agrees to plan.   Final Clinical Impressions(s) / ED Diagnoses   Final diagnoses:  Upper respiratory tract infection, unspecified type    ED Discharge Orders        Ordered    fluticasone (FLONASE) 50 MCG/ACT nasal spray  Daily     11/12/17 1718    benzonatate (TESSALON) 100 MG capsule  Every 8 hours     11/12/17 1719    promethazine-dextromethorphan (PROMETHAZINE-DM) 6.25-15 MG/5ML syrup  3 times daily PRN     11/12/17 1719       Ailanie Ruttan, PA-C 11/13/17 0143    Bethann BerkshireZammit, Joseph, MD 11/13/17 1555

## 2017-11-15 ENCOUNTER — Ambulatory Visit: Payer: Medicaid Other | Admitting: Obstetrics and Gynecology

## 2017-11-19 ENCOUNTER — Emergency Department (HOSPITAL_COMMUNITY)
Admission: EM | Admit: 2017-11-19 | Discharge: 2017-11-19 | Disposition: A | Discharge status: Home / Self Care | Payer: Medicaid Other | Attending: Emergency Medicine | Admitting: Emergency Medicine

## 2017-11-19 ENCOUNTER — Other Ambulatory Visit: Payer: Self-pay

## 2017-11-19 ENCOUNTER — Encounter (HOSPITAL_COMMUNITY): Payer: Self-pay

## 2017-11-19 DIAGNOSIS — G501 Atypical facial pain: Secondary | ICD-10-CM | POA: Diagnosis not present

## 2017-11-19 DIAGNOSIS — Z91040 Latex allergy status: Secondary | ICD-10-CM | POA: Diagnosis not present

## 2017-11-19 DIAGNOSIS — K0889 Other specified disorders of teeth and supporting structures: Secondary | ICD-10-CM | POA: Diagnosis present

## 2017-11-19 DIAGNOSIS — Z79899 Other long term (current) drug therapy: Secondary | ICD-10-CM | POA: Insufficient documentation

## 2017-11-19 DIAGNOSIS — R220 Localized swelling, mass and lump, head: Secondary | ICD-10-CM | POA: Insufficient documentation

## 2017-11-19 DIAGNOSIS — Z87891 Personal history of nicotine dependence: Secondary | ICD-10-CM | POA: Insufficient documentation

## 2017-11-19 DIAGNOSIS — H9202 Otalgia, left ear: Secondary | ICD-10-CM | POA: Diagnosis not present

## 2017-11-19 MED ORDER — TRAMADOL HCL 50 MG PO TABS: 50.0000 mg | ORAL_TABLET | Freq: Four times a day (QID) | ORAL | 0 refills | Status: DC | PRN

## 2017-11-19 MED ORDER — AMOXICILLIN 500 MG PO CAPS: 500.0000 mg | ORAL_CAPSULE | Freq: Three times a day (TID) | ORAL | 0 refills | Status: DC

## 2017-11-19 MED ORDER — HYDROCODONE-ACETAMINOPHEN 5-325 MG PO TABS
1.0000 | ORAL_TABLET | Freq: Once | ORAL | Status: AC
  Administered 2017-11-19: 1 via ORAL
  Filled 2017-11-19: qty 1

## 2017-11-19 NOTE — ED Notes (Signed)
Pt departed in NAD, refused use of wheelchair.  

## 2017-11-19 NOTE — Discharge Instructions (Signed)
You have a dental injury/infection. It is very important that you get evaluated by a dentist as soon as possible. Call tomorrow to schedule an appointment. Ibuprofen and tylenol as needed for pain.  Have given you a few pills of tramadol to take for pain that is not controlled with ibuprofen and Tylenol to help you sleep at night.  Take your full course of antibiotics. Read the instructions below.  Eat a soft or liquid diet and rinse your mouth out after meals with warm water. You should see a dentist or return here at once if you have increased swelling, increased pain or uncontrolled bleeding from the site of your injury.  SEEK MEDICAL CARE IF:  You have increased pain not controlled with medicines.  You have swelling around your tooth, in your face or neck.  You have bleeding which starts, continues, or gets worse.  You have a fever >101 If you are unable to open your mouth

## 2017-11-19 NOTE — ED Triage Notes (Signed)
Per Pt, Pt is coming from home with complaints of left-sided dental pain x 3 days. Known broken tooth on that side. Reports earache and headache with pain.

## 2017-11-19 NOTE — ED Provider Notes (Signed)
[facility]   CSN: [id number] Arrival date & time: 11/19/17  1739     History   Chief Complaint Chief Complaint  Patient presents with  . Dental Pain    HPI Tanya Moreno is a 26 y.o. female.  HPI 26 year old African-American female with no pertinent past medical history presents to the emergency department today with complaints of left-sided dental pain.  The patient said the pain started 3 days ago.  States the pain radiates to her left ear and the left side of her face.  She reports a mild left-sided facial swelling.  The patient has a known broken tooth with a filling that has fallen out.  She did schedule appointment with dentist on Monday.  She denies any associated fevers or chills.  Denies any difficulties breathing or swallowing.  Thought that she may have an ear infection at first however the pain in her tooth has been progressing and she has some mild left-sided facial swelling.  Moving and palpation makes the pain worse.  Nothing makes the pain better.  Past Medical History:  Diagnosis Date  . Chlamydia   . Genital HSV    Last outbreak Nov 2013  . Gonorrhea   . Trichomonas     Patient Active Problem List   Diagnosis Date Noted  . Gestational hypertension 10/15/2017  . LGA (large for gestational age) fetus, third trimester 10/04/2017  . Positive GBS test 09/24/2017  . Carpal tunnel syndrome during pregnancy 08/24/2017  . History of HSV 04/14/2017  . BMI 40.0-44.9, adult (HCC) 04/14/2017  . Obesity in pregnancy, antepartum 04/14/2017  . Supervision of other normal pregnancy, antepartum 04/09/2017  . Chlamydia infection affecting pregnancy in first trimester 02/09/2017  . NSVD (normal spontaneous vaginal delivery) 04/14/2013  . Latex allergy 08/26/2011    Past Surgical History:  Procedure Laterality Date  . NO PAST SURGERIES      OB History    Gravida Para Term Preterm AB Living   3 3 3     3    SAB  TAB Ectopic Multiple Live Births         0 3       Home Medications    Prior to Admission medications   Medication Sig Start Date End Date Taking? Authorizing Provider  amoxicillin (AMOXIL) 500 MG capsule Take 1 capsule (500 mg total) by mouth 3 (three) times daily. 11/19/17   Rise MuLeaphart, Kanetra Ho T, PA-C  benzonatate (TESSALON) 100 MG capsule Take 1 capsule (100 mg total) by mouth every 8 (eight) hours. 11/12/17   Caccavale, Sophia, PA-C  fluticasone (FLONASE) 50 MCG/ACT nasal spray Place 1 spray into both nostrils daily. 11/12/17   Caccavale, Sophia, PA-C  promethazine-dextromethorphan (PROMETHAZINE-DM) 6.25-15 MG/5ML syrup Take 5 mLs by mouth 3 (three) times daily as needed for cough. 11/12/17   Caccavale, Sophia, PA-C  traMADol (ULTRAM) 50 MG tablet Take 1 tablet (50 mg total) by mouth every 6 (six) hours as needed. 11/19/17   Rise MuLeaphart, Carine Nordgren T, PA-C    Family History Family History  Problem Relation Age of Onset  . Heart disease Father   . Kidney disease Maternal Aunt     Social History Social History   Tobacco Use  . Smoking status: Former Smoker    Last attempt to quit: 2015    Years since quitting: 3.9  . Smokeless tobacco: Former NeurosurgeonUser    Quit date: 11/10/2014  Substance Use Topics  . Alcohol use: No  Alcohol/week: 0.0 oz  . Drug use: No     Allergies   Latex and Tape   Review of Systems Review of Systems  Constitutional: Negative for chills and fever.  HENT: Positive for dental problem and facial swelling. Negative for trouble swallowing.   Respiratory: Negative for shortness of breath.      Physical Exam Updated Vital Signs BP (!) 152/96 (BP Location: Right Arm)   Pulse 71   Temp 98.4 F (36.9 C) (Oral)   Resp 16   Ht 5\' 4"  (1.626 m)   Wt 111.1 kg (245 lb)   LMP 11/12/2017 (Exact Date)   SpO2 100%   BMI 42.05 kg/m   Physical Exam  Constitutional: She appears well-developed and well-nourished. No distress.  HENT:  Head: Normocephalic and  atraumatic.  Mouth/Throat: Uvula is midline, oropharynx is clear and moist and mucous membranes are normal. No trismus in the jaw. No uvula swelling.    No sublingual or submandibular swelling.  Oropharynx is clear.  Managing secretions and tolerating airway.  Speaking complete sentences.  Mild left-sided facial swelling noted.  Canals appear normal.  Unable to visualize TMs bilaterally due to cerumen impactions.  Eyes: Conjunctivae are normal. Right eye exhibits no discharge. Left eye exhibits no discharge. No scleral icterus.  Neck: Normal range of motion. Neck supple.  No c spine midline tenderness. No paraspinal tenderness. No deformities or step offs noted. Full ROM. Supple. No nuchal rigidity.    Pulmonary/Chest: No respiratory distress.  Musculoskeletal: Normal range of motion.  Neurological: She is alert.  Skin: Skin is warm and dry. Capillary refill takes less than 2 seconds. No pallor.  Psychiatric: Her behavior is normal. Judgment and thought content normal.  Nursing note and vitals reviewed.    ED Treatments / Results  Labs (all labs ordered are listed, but only abnormal results are displayed) Labs Reviewed - No data to display  EKG  EKG Interpretation None       Radiology No results found.  Procedures Procedures (including critical care time)  Medications Ordered in ED Medications  HYDROcodone-acetaminophen (NORCO/VICODIN) 5-325 MG per tablet 1 tablet (not administered)     Initial Impression / Assessment and Plan / ED Course  I have reviewed the triage vital signs and the nursing notes.  Pertinent labs & imaging results that were available during my care of the patient were reviewed by me and considered in my medical decision making (see chart for details).     Patient with toothache.  No gross abscess.  Exam unconcerning for Ludwig's angina or spread of infection.  Will treat with penicillin and pain medicine.  Urged patient to follow-up with dentist.      Final Clinical Impressions(s) / ED Diagnoses   Final diagnoses:  Pain, dental    ED Discharge Orders        Ordered    amoxicillin (AMOXIL) 500 MG capsule  3 times daily     11/19/17 1906    traMADol (ULTRAM) 50 MG tablet  Every 6 hours PRN     11/19/17 1906       Wallace KellerLeaphart, Shanetra Blumenstock T, PA-C 11/19/17 1911    Raeford RazorKohut, Stephen, MD 11/23/17 [phone]

## 2017-12-14 ENCOUNTER — Ambulatory Visit: Payer: Medicaid Other | Admitting: Obstetrics and Gynecology

## 2017-12-21 ENCOUNTER — Ambulatory Visit: Payer: Medicaid Other | Admitting: Certified Nurse Midwife

## 2018-02-23 ENCOUNTER — Other Ambulatory Visit: Payer: Self-pay

## 2018-02-23 ENCOUNTER — Emergency Department (HOSPITAL_COMMUNITY): Payer: Self-pay

## 2018-02-23 ENCOUNTER — Encounter (HOSPITAL_COMMUNITY): Payer: Self-pay

## 2018-02-23 ENCOUNTER — Emergency Department (HOSPITAL_COMMUNITY)
Admission: EM | Admit: 2018-02-23 | Discharge: 2018-02-23 | Disposition: A | Discharge status: Home / Self Care | Payer: Self-pay | Attending: Emergency Medicine | Admitting: Emergency Medicine

## 2018-02-23 DIAGNOSIS — Y998 Other external cause status: Secondary | ICD-10-CM | POA: Insufficient documentation

## 2018-02-23 DIAGNOSIS — Z91040 Latex allergy status: Secondary | ICD-10-CM | POA: Insufficient documentation

## 2018-02-23 DIAGNOSIS — Y939 Activity, unspecified: Secondary | ICD-10-CM | POA: Insufficient documentation

## 2018-02-23 DIAGNOSIS — S8992XA Unspecified injury of left lower leg, initial encounter: Secondary | ICD-10-CM | POA: Insufficient documentation

## 2018-02-23 DIAGNOSIS — W010XXA Fall on same level from slipping, tripping and stumbling without subsequent striking against object, initial encounter: Secondary | ICD-10-CM | POA: Insufficient documentation

## 2018-02-23 DIAGNOSIS — Z87891 Personal history of nicotine dependence: Secondary | ICD-10-CM | POA: Insufficient documentation

## 2018-02-23 DIAGNOSIS — Y929 Unspecified place or not applicable: Secondary | ICD-10-CM | POA: Insufficient documentation

## 2018-02-23 NOTE — Discharge Instructions (Signed)
Please read instructions below. Apply ice to your knee for 20 minutes at a time. Elevate it when possible. Wear the brace at all times. You can take advil/ibuprofen every 6 hours as needed for pain. Schedule an appointment with the orthopedic specialist in 1 week for follow-up on your injury. Return to the ER for new or concerning symptoms.

## 2018-02-23 NOTE — ED Triage Notes (Signed)
Pt states she fell last night and has right knee pain. Pt states pain increases with weight bearing. Pt ambulatory.

## 2018-02-23 NOTE — ED Notes (Signed)
Patient verbalized understanding of discharge instructions and denies any further needs or questions at this time. VS stable. Patient ambulatory with steady gait. RN escorted to ED entrance in wheelchair.   

## 2018-02-23 NOTE — ED Notes (Signed)
Ortho at bedside.

## 2018-02-23 NOTE — Progress Notes (Signed)
Orthopedic Tech Progress Note Patient Details:  Lillard AnesCierra S Dines 09/21/1991 [id number]  Ortho Devices Type of Ortho Device: Crutches, Knee Immobilizer Ortho Device/Splint Location: LLE Ortho Device/Splint Interventions: Ordered, Application, Adjustment   Post Interventions Patient Tolerated: Well   Jennye MoccasinHughes, Breindel Collier Craig 02/23/2018, 8:49 PM

## 2018-02-23 NOTE — ED Provider Notes (Signed)
[facility] EMERGENCY DEPARTMENT Provider Note   CSN: [id number] Arrival date & time: 02/23/18  1649     History   Chief Complaint Chief Complaint  Patient presents with  . Knee Pain    HPI Tanya Moreno is a 27 y.o. female presenting to the ED with acute onset of left knee pain status post mechanical fall occurred last night.  Patient states she tripped and fell directly onto the left knee.  She localizes pain to the anterior aspect of the knee, worse with palpation and ambulation.  Has not taken any medications for her symptoms.  No prior injuries to this knee.  No other injury sustained from fall.  The history is provided by the patient.    Past Medical History:  Diagnosis Date  . Chlamydia   . Genital HSV    Last outbreak Nov 2013  . Gonorrhea   . Trichomonas     Patient Active Problem List   Diagnosis Date Noted  . Gestational hypertension 10/15/2017  . LGA (large for gestational age) fetus, third trimester 10/04/2017  . Positive GBS test 09/24/2017  . Carpal tunnel syndrome during pregnancy 08/24/2017  . History of HSV 04/14/2017  . BMI 40.0-44.9, adult (HCC) 04/14/2017  . Obesity in pregnancy, antepartum 04/14/2017  . Supervision of other normal pregnancy, antepartum 04/09/2017  . Chlamydia infection affecting pregnancy in first trimester 02/09/2017  . NSVD (normal spontaneous vaginal delivery) 04/14/2013  . Latex allergy 08/26/2011    Past Surgical History:  Procedure Laterality Date  . NO PAST SURGERIES      OB History    Gravida Para Term Preterm AB Living   3 3 3     3    SAB TAB Ectopic Multiple Live Births         0 3       Home Medications    Prior to Admission medications   Medication Sig Start Date End Date Taking? Authorizing Provider  amoxicillin (AMOXIL) 500 MG capsule Take 1 capsule (500 mg total) by mouth 3 (three) times daily. 11/19/17   Rise Mu, PA-C  benzonatate (TESSALON) 100 MG capsule Take 1  capsule (100 mg total) by mouth every 8 (eight) hours. 11/12/17   Caccavale, Sophia, PA-C  fluticasone (FLONASE) 50 MCG/ACT nasal spray Place 1 spray into both nostrils daily. 11/12/17   Caccavale, Sophia, PA-C  promethazine-dextromethorphan (PROMETHAZINE-DM) 6.25-15 MG/5ML syrup Take 5 mLs by mouth 3 (three) times daily as needed for cough. 11/12/17   Caccavale, Sophia, PA-C  traMADol (ULTRAM) 50 MG tablet Take 1 tablet (50 mg total) by mouth every 6 (six) hours as needed. 11/19/17   Rise Mu, PA-C    Family History Family History  Problem Relation Age of Onset  . Heart disease Father   . Kidney disease Maternal Aunt     Social History Social History   Tobacco Use  . Smoking status: Former Smoker    Last attempt to quit: 2015    Years since quitting: 4.2  . Smokeless tobacco: Former Neurosurgeon    Quit date: 11/10/2014  Substance Use Topics  . Alcohol use: No    Alcohol/week: 0.0 oz  . Drug use: No     Allergies   Latex and Tape   Review of Systems Review of Systems  Musculoskeletal: Positive for arthralgias.  Skin: Negative for wound.     Physical Exam Updated Vital Signs BP (!) 146/85 (BP Location: Right Arm)   Pulse 63  Temp 98.5 F (36.9 C) (Oral)   Resp 17   LMP 02/21/2018 (Within Days)   SpO2 100%   Physical Exam  Constitutional: She appears well-developed and well-nourished. No distress.  HENT:  Head: Normocephalic and atraumatic.  Eyes: Conjunctivae are normal.  Cardiovascular: Normal rate and intact distal pulses.  Pulmonary/Chest: Effort normal.  Musculoskeletal:  TTP over anteriorand  medial aspect of left knee, no edema or ecchymosis.  No deformities.  No wounds.  Pain with range of motion.  Negative valgus and varus, negative anterior and posterior drawer test.  No crepitus.  Psychiatric: She has a normal mood and affect. Her behavior is normal.  Nursing note and vitals reviewed.    ED Treatments / Results  Labs (all labs ordered are  listed, but only abnormal results are displayed) Labs Reviewed - No data to display  EKG  EKG Interpretation None       Radiology Dg Knee Complete 4 Views Left  Result Date: 02/23/2018 CLINICAL DATA:  Status post fall.  Pain along the medial aspect. EXAM: LEFT KNEE - COMPLETE 4+ VIEW COMPARISON:  None. FINDINGS: No evidence of fracture, dislocation, or joint effusion. Mild joint space narrowing of the medial femorotibial compartment. Soft tissues are unremarkable. IMPRESSION: No acute osseous injury of the left knee. Electronically Signed   By: Elige KoHetal  Patel   On: 02/23/2018 20:19    Procedures Procedures (including critical care time)  Medications Ordered in ED Medications - No data to display   Initial Impression / Assessment and Plan / ED Course  I have reviewed the triage vital signs and the nursing notes.  Pertinent labs & imaging results that were available during my care of the patient were reviewed by me and considered in my medical decision making (see chart for details).     Patient w left pain status post mechanical fall that occurred yesterday.  X-Ray negative for obvious fracture or dislocation.  Knee is stable on exam.  Neurovascularly intact.  Knee immobilizer brace applied in the ED, and patient recommended follow-up with orthopedics if symptoms persist.  Conservative therapy recommended and discussed. Patient will be dc home & is agreeable with above plan.  Discussed results, findings, treatment and follow up. Patient advised of return precautions. Patient verbalized understanding and agreed with plan.  Final Clinical Impressions(s) / ED Diagnoses   Final diagnoses:  Left knee injury, initial encounter    ED Discharge Orders    None       Vue Pavon, [place]azil[place]Jordan N, PA-C 02/23/18 2049    Linwood DibblesKnapp, Jon, MD 02/24/18 [phone]

## 2018-05-07 IMAGING — CR DG ANKLE COMPLETE 3+V*R*
3 series · 3 of 3 positions shown · non-contrast
Comparison: 02/26/2014

CLINICAL DATA: Pt rolled her ankle when she stepped in a small hole
in the ground last night, lateral right ankle pain and swelling

EXAM:
RIGHT ANKLE - COMPLETE 3+ VIEW

[ankle ap]
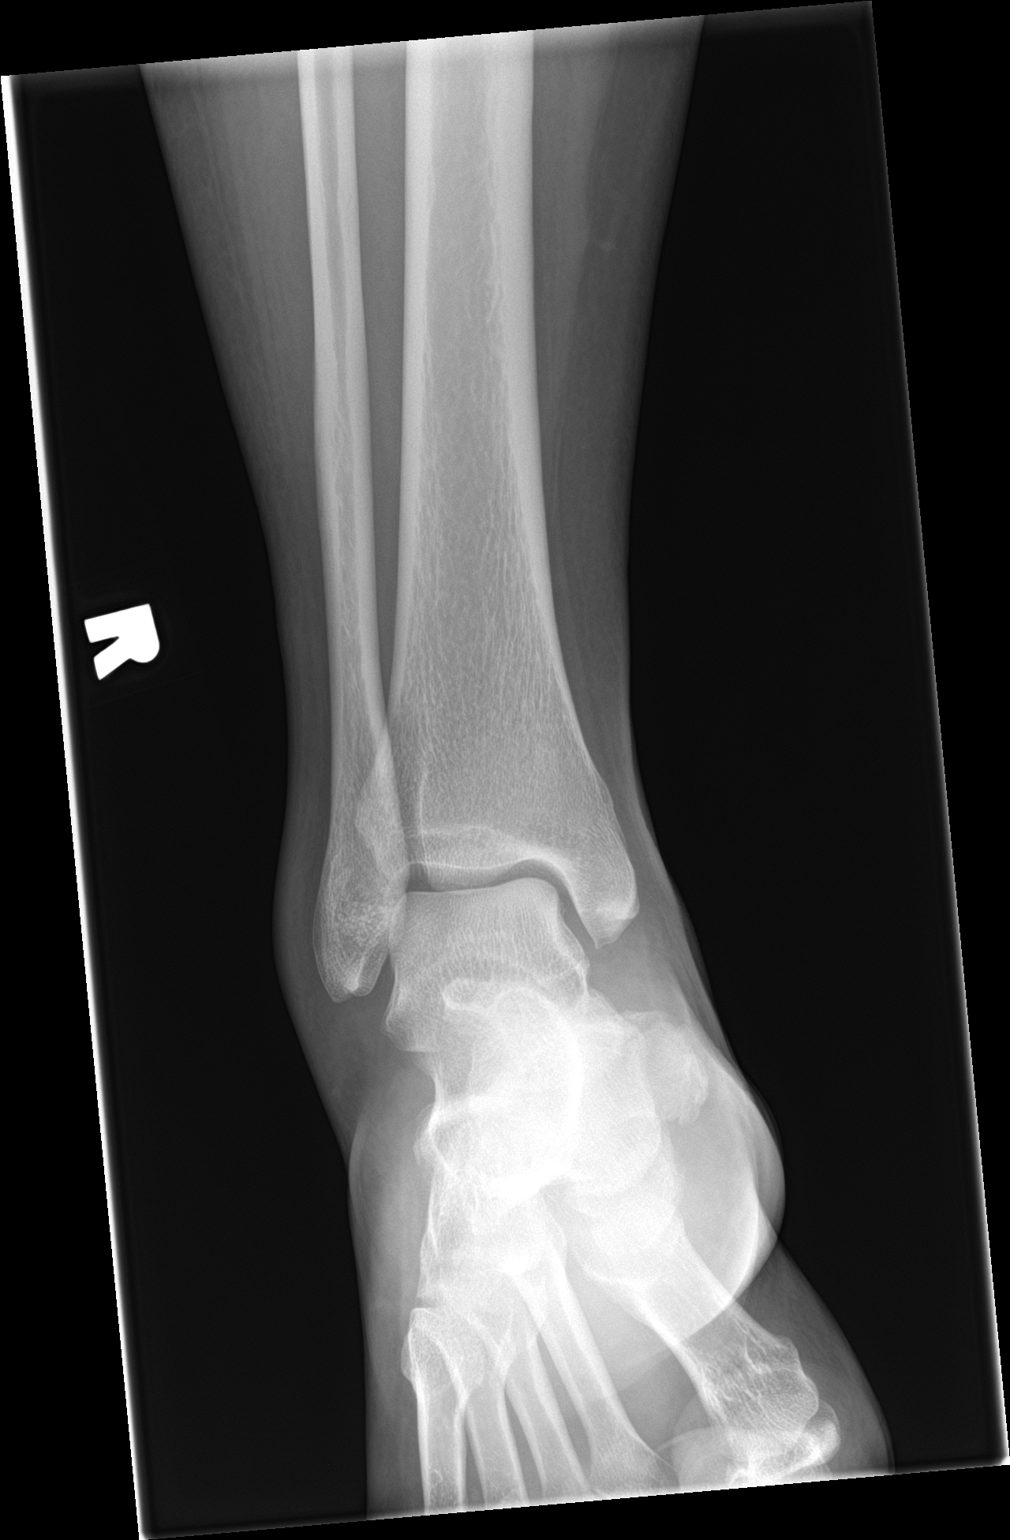

[ankle obl]
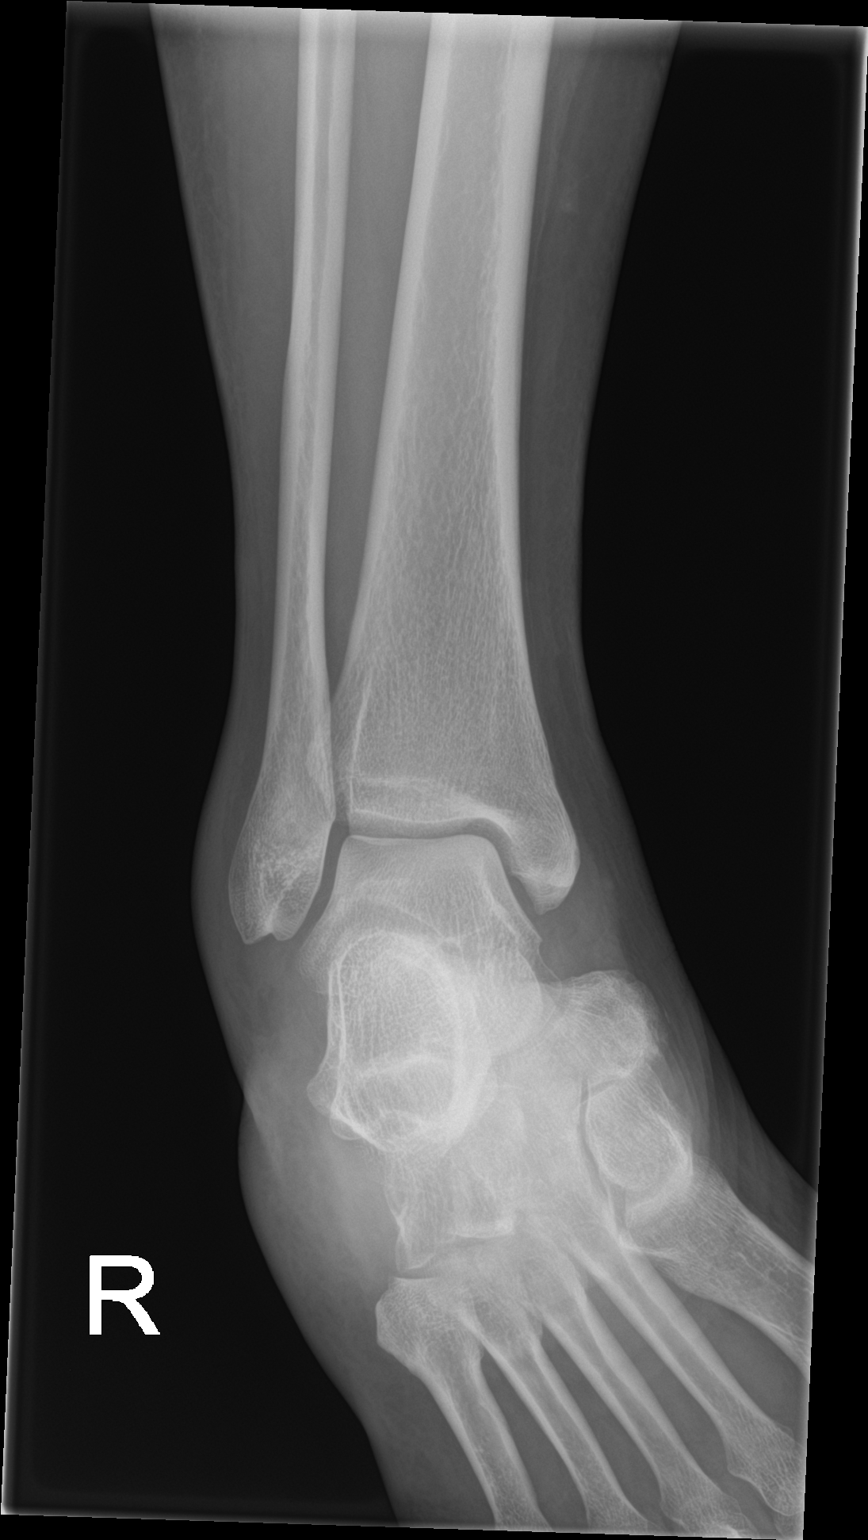

[ankle lat]
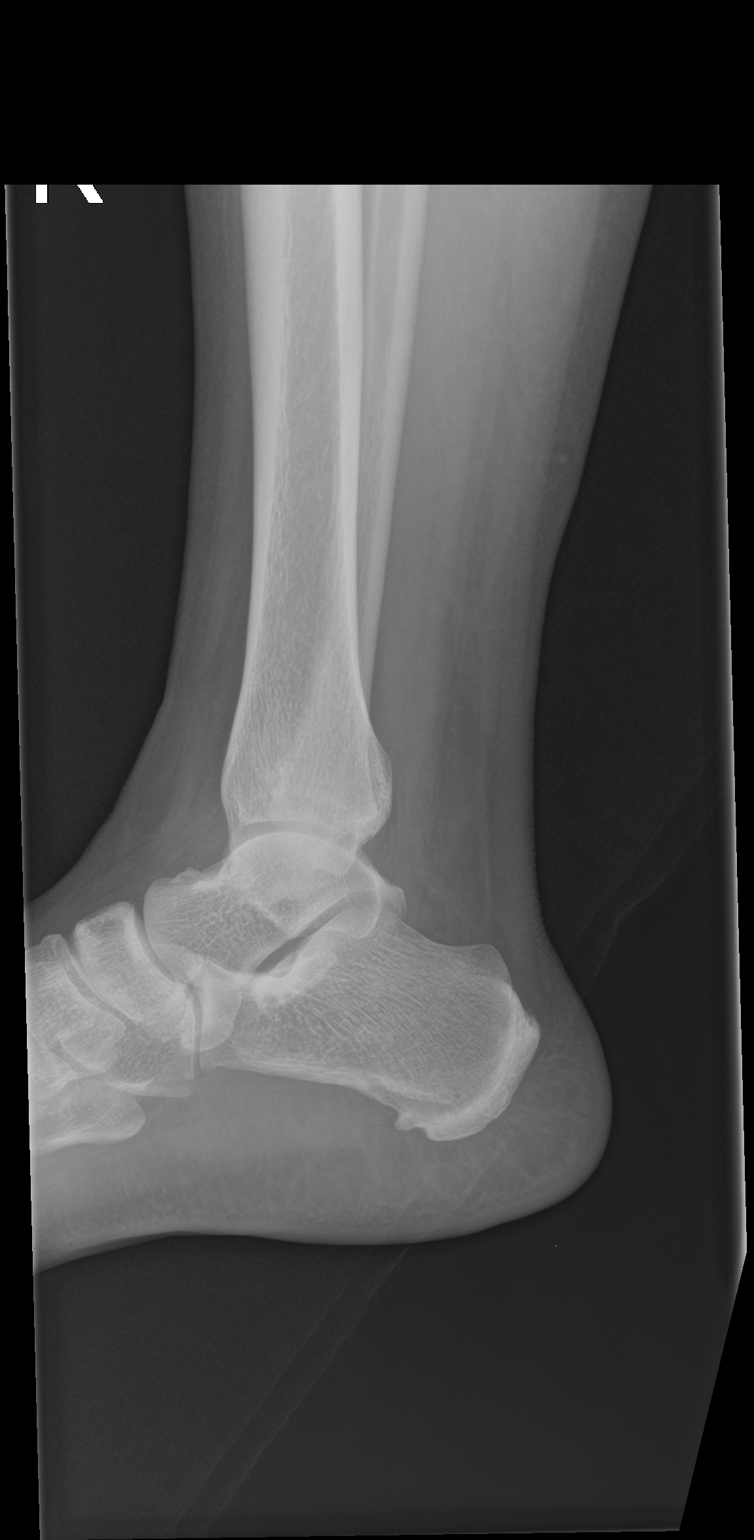

[3 of 3 positions shown; findings below may reference images not displayed]

FINDINGS: There is mild soft tissue swelling along the lateral aspect of the
ankle. No acute fracture or subluxation. The mortise is intact.
IMPRESSION: Soft tissue swelling without acute bony abnormality.

## 2018-07-30 ENCOUNTER — Emergency Department (HOSPITAL_COMMUNITY)
Admission: EM | Admit: 2018-07-30 | Discharge: 2018-07-30 | Disposition: A | Discharge status: Home / Self Care | Payer: Self-pay | Attending: Emergency Medicine | Admitting: Emergency Medicine

## 2018-07-30 ENCOUNTER — Encounter (HOSPITAL_COMMUNITY): Payer: Self-pay | Admitting: Emergency Medicine

## 2018-07-30 DIAGNOSIS — R42 Dizziness and giddiness: Secondary | ICD-10-CM | POA: Insufficient documentation

## 2018-07-30 DIAGNOSIS — Z87891 Personal history of nicotine dependence: Secondary | ICD-10-CM | POA: Insufficient documentation

## 2018-07-30 DIAGNOSIS — Z79899 Other long term (current) drug therapy: Secondary | ICD-10-CM | POA: Insufficient documentation

## 2018-07-30 DIAGNOSIS — Z91040 Latex allergy status: Secondary | ICD-10-CM | POA: Insufficient documentation

## 2018-07-30 LAB — URINALYSIS, ROUTINE W REFLEX MICROSCOPIC
BACTERIA UA: NONE SEEN
Bilirubin Urine: NEGATIVE
Glucose, UA: NEGATIVE mg/dL
Hgb urine dipstick: NEGATIVE
KETONES UR: NEGATIVE mg/dL
Nitrite: NEGATIVE
Protein, ur: NEGATIVE mg/dL
Specific Gravity, Urine: 1.024 (ref 1.005–1.030)
pH: 6 (ref 5.0–8.0)

## 2018-07-30 LAB — BASIC METABOLIC PANEL
Anion gap: 8 (ref 5–15)
BUN: 9 mg/dL (ref 6–20)
CO2: 20 mmol/L — ABNORMAL LOW (ref 22–32)
Calcium: 8.9 mg/dL (ref 8.9–10.3)
Chloride: 110 mmol/L (ref 98–111)
Creatinine, Ser: 0.64 mg/dL (ref 0.44–1.00)
GFR calc non Af Amer: >60 mL/min (ref >60)
Glucose, Bld: 114 mg/dL — ABNORMAL HIGH (ref 70–99)
POTASSIUM: 3.6 mmol/L (ref 3.5–5.1)
SODIUM: 138 mmol/L (ref 135–145)

## 2018-07-30 LAB — CBC
HEMATOCRIT: 46 % (ref 36.0–46.0)
Hemoglobin: 15.2 g/dL — ABNORMAL HIGH (ref 12.0–15.0)
MCH: 30 pg (ref 26.0–34.0)
MCHC: 33 g/dL (ref 30.0–36.0)
MCV: 90.9 fL (ref 78.0–100.0)
Platelets: 322 10*3/uL (ref 150–400)
RBC: 5.06 MIL/uL (ref 3.87–5.11)
RDW: 12.4 % (ref 11.5–15.5)
WBC: 5.4 10*3/uL (ref 4.0–10.5)

## 2018-07-30 LAB — I-STAT BETA HCG BLOOD, ED (MC, WL, AP ONLY)

## 2018-07-30 LAB — CBG MONITORING, ED: GLUCOSE-CAPILLARY: 83 mg/dL (ref 70–99)

## 2018-07-30 MED ORDER — MECLIZINE HCL 12.5 MG PO TABS: 12.5000 mg | ORAL_TABLET | Freq: Three times a day (TID) | ORAL | 0 refills | Status: DC | PRN

## 2018-07-30 MED ORDER — SODIUM CHLORIDE 0.9 % IV BOLUS: 1000.0000 mL | Freq: Once | INTRAVENOUS | Status: DC

## 2018-07-30 MED ORDER — MECLIZINE HCL 25 MG PO TABS
12.5000 mg | ORAL_TABLET | Freq: Once | ORAL | Status: AC
  Administered 2018-07-30: 12.5 mg via ORAL
  Filled 2018-07-30: qty 1

## 2018-07-30 NOTE — ED Notes (Signed)
Pt ambulated to bathroom with strong and steady gait.

## 2018-07-30 NOTE — ED Notes (Signed)
Pt walking with steady gait; pt denies any further dizziness at this time ; pt states " I feel a lot better at this time "

## 2018-07-30 NOTE — ED Notes (Signed)
Pt refusing an IV ; pt states " I already got stuck once, I'm not getting stuck again"

## 2018-07-30 NOTE — ED Provider Notes (Signed)
[facility] EMERGENCY DEPARTMENT Provider Note   CSN: [id number] Arrival date & time: 07/30/18  1007     History   Chief Complaint Chief Complaint  Patient presents with  . Dizziness    HPI Tanya Moreno is a 27 y.o. female.  27 y.o female with no PMH presents to the ED with a chief complaint of dizziness x 2 days.Patient states she experiences the dizziness when she stands up and describes it as the room spinning. Patient states she first noticed the symptoms when she was at work, she is a Advertising copywriter for a living. She reports the dizziness is better when she is in place or sitting down. She has tried tylenol but this has not helped her dizziness. She denies any trauma, headache or previous history of vertigo.She denies any syncope, chest pain, shortness of breath or other complaints.       Past Medical History:  Diagnosis Date  . Chlamydia   . Genital HSV    Last outbreak Nov 2013  . Gonorrhea   . Trichomonas     Patient Active Problem List   Diagnosis Date Noted  . Gestational hypertension 10/15/2017  . LGA (large for gestational age) fetus, third trimester 10/04/2017  . Positive GBS test 09/24/2017  . Carpal tunnel syndrome during pregnancy 08/24/2017  . History of HSV 04/14/2017  . BMI 40.0-44.9, adult (HCC) 04/14/2017  . Obesity in pregnancy, antepartum 04/14/2017  . Supervision of other normal pregnancy, antepartum 04/09/2017  . Chlamydia infection affecting pregnancy in first trimester 02/09/2017  . NSVD (normal spontaneous vaginal delivery) 04/14/2013  . Latex allergy 08/26/2011    Past Surgical History:  Procedure Laterality Date  . NO PAST SURGERIES       OB History    Gravida  3   Para  3   Term  3   Preterm      AB      Living  3     SAB      TAB      Ectopic      Multiple  0   Live Births  3            Home Medications    Prior to Admission medications   Medication Sig Start Date End Date Taking?  Authorizing Provider  amoxicillin (AMOXIL) 500 MG capsule Take 1 capsule (500 mg total) by mouth 3 (three) times daily. 11/19/17   Rise Mu, PA-C  benzonatate (TESSALON) 100 MG capsule Take 1 capsule (100 mg total) by mouth every 8 (eight) hours. 11/12/17   Caccavale, Sophia, PA-C  fluticasone (FLONASE) 50 MCG/ACT nasal spray Place 1 spray into both nostrils daily. 11/12/17   Caccavale, Sophia, PA-C  meclizine (ANTIVERT) 12.5 MG tablet Take 1 tablet (12.5 mg total) by mouth 3 (three) times daily as needed for dizziness. 07/30/18   Claude Manges, PA-C  promethazine-dextromethorphan (PROMETHAZINE-DM) 6.25-15 MG/5ML syrup Take 5 mLs by mouth 3 (three) times daily as needed for cough. 11/12/17   Caccavale, Sophia, PA-C  traMADol (ULTRAM) 50 MG tablet Take 1 tablet (50 mg total) by mouth every 6 (six) hours as needed. 11/19/17   Rise Mu, PA-C    Family History Family History  Problem Relation Age of Onset  . Heart disease Father   . Kidney disease Maternal Aunt     Social History Social History   Tobacco Use  . Smoking status: Former Smoker    Last attempt to quit: 2015  Years since quitting: 4.6  . Smokeless tobacco: Former NeurosurgeonUser    Quit date: 11/10/2014  Substance Use Topics  . Alcohol use: No    Alcohol/week: 0.0 standard drinks  . Drug use: No     Allergies   Latex and Tape   Review of Systems Review of Systems  Constitutional: Negative for chills, diaphoresis and fever.  HENT: Negative for facial swelling, rhinorrhea and sore throat.   Respiratory: Negative for shortness of breath.   Cardiovascular: Negative for chest pain.  Gastrointestinal: Negative for abdominal pain, nausea and vomiting.  Genitourinary: Negative for dysuria and flank pain.  Musculoskeletal: Negative for back pain.  Neurological: Positive for dizziness. Negative for tremors, seizures, syncope, weakness, light-headedness and headaches.  All other systems reviewed and are  negative.    Physical Exam Updated Vital Signs BP 108/69 (BP Location: Left Arm)   Pulse 71   Temp 98.7 F (37.1 C) (Oral)   Resp 18   Ht 5\' 4"  (1.626 m)   LMP 07/03/2018   SpO2 100%   BMI 42.05 kg/m   Physical Exam  Constitutional: She is oriented to person, place, and time. She appears well-developed and well-nourished.  HENT:  Head: Normocephalic and atraumatic.  Eyes: Pupils are equal, round, and reactive to light.  Neck: Normal range of motion. Neck supple.  Cardiovascular: Normal heart sounds.  Pulmonary/Chest: Effort normal and breath sounds normal. She has no wheezes.  Abdominal: Soft. Bowel sounds are normal. There is no tenderness.  Musculoskeletal: She exhibits no tenderness or deformity.  Neurological: She is alert and oriented to person, place, and time. She has normal strength. GCS eye subscore is 4. GCS verbal subscore is 5. GCS motor subscore is 6.  CN II-CN XII neurologically intact. No facial asymmetry seen on exam.     Skin: Skin is warm and dry. Capillary refill takes less than 2 seconds.  Nursing note and vitals reviewed.    ED Treatments / Results  Labs (all labs ordered are listed, but only abnormal results are displayed) Labs Reviewed  BASIC METABOLIC PANEL - Abnormal; Notable for the following components:      Result Value   CO2 20 (*)    Glucose, Bld 114 (*)    All other components within normal limits  CBC - Abnormal; Notable for the following components:   Hemoglobin 15.2 (*)    All other components within normal limits  URINALYSIS, ROUTINE W REFLEX MICROSCOPIC - Abnormal; Notable for the following components:   Leukocytes, UA TRACE (*)    All other components within normal limits  CBG MONITORING, ED  I-STAT BETA HCG BLOOD, ED ([facility], [facility], [facility] ONLY)    EKG EKG Interpretation  Date/Time:  Saturday July 30 2018 10:51:07 EDT Ventricular Rate:  81 PR Interval:  134 QRS Duration: 82 QT Interval:  378 QTC Calculation: 439 R  Axis:   53 Text Interpretation:  Normal sinus rhythm Normal ECG Confirmed by Glynn Octaveancour, Stephen [phone]), editor Evangeline DakinWalker, Sandra [phone] on 07/30/2018 11:21:52 AM   Radiology No results found.  Procedures Procedures (including critical care time)  Medications Ordered in ED Medications  sodium chloride 0.9 % bolus 1,000 mL (1,000 mLs Intravenous Refused 07/30/18 1242)  meclizine (ANTIVERT) tablet 12.5 mg (12.5 mg Oral Given 07/30/18 1241)     Initial Impression / Assessment and Plan / ED Course  I have reviewed the triage vital signs and the nursing notes.  Pertinent labs & imaging results that were available during my care of the  patient were reviewed by me and considered in my medical decision making (see chart for details).     Patient presents with dizziness x 2 days. She states is worse with movement and standing. Patient's CBC showed no leukocytosis, BMP showed no electrolyte abnormality, creatine level is stable as 0.64. UA showed leukocyte trace but no bacteria or nitrite she denies any urinary symptoms. Patient ortho static vitals showed no changes. She has no medical history or risk factors.   Patient states she feels better after meclizine. There is no nystagmus on exam.P is currently nontoxic-appearing playing on her cell phone, symptomatic. At this time I have advised patient that I will give her a referral for the [facility], she requested a note to be off work for the next 2 days returning on Monday patient agrees and understands plan, will follow up with [facility] for further evaluation of the symptoms I have advised patient to keep on drinking fluids and Gatorade.    Final Clinical Impressions(s) / ED Diagnoses   Final diagnoses:  Dizziness    ED Discharge Orders         Ordered    meclizine (ANTIVERT) 12.5 MG tablet  3 times daily PRN     07/30/18 1316           Claude Manges, PA-C 07/30/18 1332    Sabas Sous,  MD 07/30/18 [phone]

## 2018-07-30 NOTE — Discharge Instructions (Signed)
I have prescribed medication for your symptoms, take as directed. I have provided a [facility], please schedule an appointment for further evaluation of your symptoms.

## 2018-08-20 ENCOUNTER — Encounter (HOSPITAL_COMMUNITY): Payer: Self-pay | Admitting: Physician Assistant

## 2018-08-20 ENCOUNTER — Ambulatory Visit (HOSPITAL_COMMUNITY)
Admission: EM | Admit: 2018-08-20 | Discharge: 2018-08-20 | Disposition: A | Discharge status: Home / Self Care | Payer: Self-pay | Attending: Internal Medicine | Admitting: Internal Medicine

## 2018-08-20 DIAGNOSIS — J0140 Acute pansinusitis, unspecified: Secondary | ICD-10-CM

## 2018-08-20 MED ORDER — BENZONATATE 100 MG PO CAPS: 100.0000 mg | ORAL_CAPSULE | Freq: Three times a day (TID) | ORAL | 0 refills | Status: DC

## 2018-08-20 MED ORDER — FLUTICASONE PROPIONATE 50 MCG/ACT NA SUSP: 2.0000 | Freq: Every day | NASAL | 0 refills | Status: DC

## 2018-08-20 MED ORDER — IPRATROPIUM BROMIDE 0.06 % NA SOLN: 2.0000 | Freq: Four times a day (QID) | NASAL | 0 refills | Status: DC

## 2018-08-20 MED ORDER — AMOXICILLIN-POT CLAVULANATE 875-125 MG PO TABS: 1.0000 | ORAL_TABLET | Freq: Two times a day (BID) | ORAL | 0 refills | Status: DC

## 2018-08-20 NOTE — Discharge Instructions (Addendum)
Tessalon for cough. Start flonase, atrovent nasal spray for nasal congestion/drainage. You can use over the counter nasal saline rinse such as neti pot for nasal congestion. Keep hydrated, your urine should be clear to pale yellow in color. Tylenol/motrin for fever and pain. Monitor for any worsening of symptoms, chest pain, shortness of breath, wheezing, swelling of the throat, follow up for reevaluation.   If symptoms not improving/worsening by 9/17, can fill augmentin for sinus infection.  For sore throat/cough try using a honey-based tea. Use 3 teaspoons of honey with juice squeezed from half lemon. Place shaved pieces of ginger into 1/2-1 cup of water and warm over stove top. Then mix the ingredients and repeat every 4 hours as needed.

## 2018-08-20 NOTE — ED Provider Notes (Signed)
[facility]    CSN: [id number] Arrival date & time: 08/20/18  1524     History   Chief Complaint Chief Complaint  Patient presents with  . Cough    HPI Tanya Moreno is a 27 y.o. female.   27 year old female comes in for 1 week history of URI symptoms.  States started out as sore throat that then resolved.  Now with rhinorrhea, nasal congestion, cough, body aches, headache.  Headache is frontal in nature, intermittent, states no current headache.  She has nonproductive cough without chest pain, shortness of breath, wheezing, palpitations.  Denies fever, chills, night sweats.  OTC cold medication without relief.  Positive sick contact.  Former smoker (THC use for a few months.)     Past Medical History:  Diagnosis Date  . Chlamydia   . Genital HSV    Last outbreak Nov 2013  . Gonorrhea   . Trichomonas     Patient Active Problem List   Diagnosis Date Noted  . Gestational hypertension 10/15/2017  . LGA (large for gestational age) fetus, third trimester 10/04/2017  . Positive GBS test 09/24/2017  . Carpal tunnel syndrome during pregnancy 08/24/2017  . History of HSV 04/14/2017  . BMI 40.0-44.9, adult (HCC) 04/14/2017  . Obesity in pregnancy, antepartum 04/14/2017  . Supervision of other normal pregnancy, antepartum 04/09/2017  . Chlamydia infection affecting pregnancy in first trimester 02/09/2017  . NSVD (normal spontaneous vaginal delivery) 04/14/2013  . Latex allergy 08/26/2011    Past Surgical History:  Procedure Laterality Date  . NO PAST SURGERIES      OB History    Gravida  3   Para  3   Term  3   Preterm      AB      Living  3     SAB      TAB      Ectopic      Multiple  0   Live Births  3            Home Medications    Prior to Admission medications   Medication Sig Start Date End Date Taking? Authorizing Provider  amoxicillin-clavulanate (AUGMENTIN) 875-125 MG tablet Take 1 tablet by mouth every 12 (twelve)  hours. Fill on 9/17 if symptoms not improving 08/23/18   Cathie HoopsYu, Amy V, PA-C  benzonatate (TESSALON) 100 MG capsule Take 1 capsule (100 mg total) by mouth every 8 (eight) hours. 08/20/18   Cathie HoopsYu, Amy V, PA-C  fluticasone (FLONASE) 50 MCG/ACT nasal spray Place 2 sprays into both nostrils daily. 08/20/18   Cathie HoopsYu, Amy V, PA-C  ipratropium (ATROVENT) 0.06 % nasal spray Place 2 sprays into both nostrils 4 (four) times daily. 08/20/18   Belinda FisherYu, Amy V, PA-C    Family History Family History  Problem Relation Age of Onset  . Heart disease Father   . Kidney disease Maternal Aunt     Social History Social History   Tobacco Use  . Smoking status: Former Smoker    Last attempt to quit: 2015    Years since quitting: 4.7  . Smokeless tobacco: Former NeurosurgeonUser    Quit date: 11/10/2014  Substance Use Topics  . Alcohol use: No    Alcohol/week: 0.0 standard drinks  . Drug use: No     Allergies   Latex and Tape   Review of Systems Review of Systems  Reason unable to perform ROS: See HPI as above.     Physical Exam Triage Vital Signs ED  Triage Vitals  Enc Vitals Group     BP 08/20/18 1552 (!) 150/71     Pulse Rate 08/20/18 1552 62     Resp 08/20/18 1552 18     Temp 08/20/18 1552 97.9 F (36.6 C)     Temp src --      SpO2 08/20/18 1552 99 %     Weight 08/20/18 1554 245 lb (111.1 kg)     Height --      Head Circumference --      Peak Flow --      Pain Score 08/20/18 1554 5     Pain Loc --      Pain Edu? --      Excl. in GC? --    No data found.  Updated Vital Signs BP (!) 150/71 (BP Location: Right Arm)   Pulse 62   Temp 97.9 F (36.6 C)   Resp 18   Wt 245 lb (111.1 kg)   LMP 08/03/2018   SpO2 99%   BMI 42.05 kg/m   Physical Exam  Constitutional: She is oriented to person, place, and time. She appears well-developed and well-nourished. No distress.  HENT:  Head: Normocephalic and atraumatic.  Right Ear: Tympanic membrane, external ear and ear canal normal. Tympanic membrane is not  erythematous and not bulging.  Left Ear: Tympanic membrane, external ear and ear canal normal. Tympanic membrane is not erythematous and not bulging.  Nose: Right sinus exhibits maxillary sinus tenderness and frontal sinus tenderness. Left sinus exhibits maxillary sinus tenderness and frontal sinus tenderness.  Mouth/Throat: Uvula is midline, oropharynx is clear and moist and mucous membranes are normal.  Eyes: Pupils are equal, round, and reactive to light. Conjunctivae are normal.  Neck: Normal range of motion. Neck supple.  Cardiovascular: Normal rate, regular rhythm and normal heart sounds. Exam reveals no gallop and no friction rub.  No murmur heard. Pulmonary/Chest: Effort normal and breath sounds normal. She has no decreased breath sounds. She has no wheezes. She has no rhonchi. She has no rales.  Lymphadenopathy:    She has no cervical adenopathy.  Neurological: She is alert and oriented to person, place, and time.  Skin: Skin is warm and dry.  Psychiatric: She has a normal mood and affect. Her behavior is normal. Judgment normal.     UC Treatments / Results  Labs (all labs ordered are listed, but only abnormal results are displayed) Labs Reviewed - No data to display  EKG None  Radiology No results found.  Procedures Procedures (including critical care time)  Medications Ordered in UC Medications - No data to display  Initial Impression / Assessment and Plan / UC Course  I have reviewed the triage vital signs and the nursing notes.  Pertinent labs & imaging results that were available during my care of the patient were reviewed by me and considered in my medical decision making (see chart for details).    Discussed possible sinusitis causing symptoms, given 1 week history of symptoms, will start out with symptomatic treatment.  Rx of Augmentin sent to pharmacy, can fill in 3 days if symptoms do not improving.  Return precautions given.  Patient expresses understanding  and agrees to plan.  Final Clinical Impressions(s) / UC Diagnoses   Final diagnoses:  Acute non-recurrent pansinusitis    ED Prescriptions    Medication Sig Dispense Auth. Provider   ipratropium (ATROVENT) 0.06 % nasal spray Place 2 sprays into both nostrils 4 (four) times daily. 15 mL Cathie Hoops,  Amy V, PA-C   fluticasone (FLONASE) 50 MCG/ACT nasal spray Place 2 sprays into both nostrils daily. 1 g Yu, Amy V, PA-C   benzonatate (TESSALON) 100 MG capsule Take 1 capsule (100 mg total) by mouth every 8 (eight) hours. 21 capsule Yu, Amy V, PA-C   amoxicillin-clavulanate (AUGMENTIN) 875-125 MG tablet Take 1 tablet by mouth every 12 (twelve) hours. Fill on 9/17 if symptoms not improving 14 tablet Threasa Alpha, [place] 08/20/18 1650

## 2018-08-20 NOTE — ED Triage Notes (Signed)
Pt states she has a cough and headache x 2 weeks

## 2018-10-03 ENCOUNTER — Inpatient Hospital Stay (HOSPITAL_COMMUNITY)
Admission: AD | Admit: 2018-10-03 | Discharge: 2018-10-03 | Disposition: A | Discharge status: Home / Self Care | Payer: Self-pay | Source: Ambulatory Visit | Attending: Obstetrics and Gynecology | Admitting: Obstetrics and Gynecology

## 2018-10-03 DIAGNOSIS — Z7951 Long term (current) use of inhaled steroids: Secondary | ICD-10-CM | POA: Insufficient documentation

## 2018-10-03 DIAGNOSIS — Z3202 Encounter for pregnancy test, result negative: Secondary | ICD-10-CM

## 2018-10-03 DIAGNOSIS — Z79899 Other long term (current) drug therapy: Secondary | ICD-10-CM | POA: Insufficient documentation

## 2018-10-03 DIAGNOSIS — Z91040 Latex allergy status: Secondary | ICD-10-CM | POA: Insufficient documentation

## 2018-10-03 DIAGNOSIS — Z87891 Personal history of nicotine dependence: Secondary | ICD-10-CM | POA: Insufficient documentation

## 2018-10-03 DIAGNOSIS — Z888 Allergy status to other drugs, medicaments and biological substances status: Secondary | ICD-10-CM | POA: Insufficient documentation

## 2018-10-03 DIAGNOSIS — N912 Amenorrhea, unspecified: Secondary | ICD-10-CM

## 2018-10-03 LAB — POCT PREGNANCY, URINE: Preg Test, Ur: NEGATIVE

## 2018-10-03 NOTE — MAU Note (Signed)
Pt here with c/o of possible pregnancy. LMP 08/03/18. Had two neg pregnancy tests at home.

## 2018-10-03 NOTE — Discharge Instructions (Signed)
Secondary Amenorrhea Secondary amenorrhea is the stopping of menstrual flow for 3-6 months in a female who has previously had periods. There are many possible causes. Most of these causes are not serious. Usually, treating the underlying problem causing the loss of menses will return your periods to normal. What are the causes? Some common and uncommon causes of not menstruating include:  Malnutrition.  Low blood sugar (hypoglycemia).  Polycystic ovary disease.  Stress or fear.  Breastfeeding.  Hormone imbalance.  Ovarian failure.  Medicines.  Extreme obesity.  Cystic fibrosis.  Low body weight or drastic weight reduction from any cause.  Early menopause.  Removal of ovaries or uterus.  Contraceptives.  Illness.  Long-term (chronic) illnesses.  Cushing syndrome.  Thyroid problems.  Birth control pills, patches, or vaginal rings for birth control.  What increases the risk? You may be at greater risk of secondary amenorrhea if:  You have a family history of this condition.  You have an eating disorder.  You do athletic training.  How is this diagnosed? A diagnosis is made by your health care provider taking a medical history and doing a physical exam. This will include a pelvic exam to check for problems with your reproductive organs. Pregnancy must be ruled out. Often, numerous blood tests are done to measure different hormones in the body. Urine testing may be done. Specialized exams (ultrasound, CT scan, MRI, or hysteroscopy) may have to be done as well as measuring the body mass index (BMI). How is this treated? Treatment depends on the cause of the amenorrhea. If an eating disorder is present, this can be treated with an adequate diet and therapy. Chronic illnesses may improve with treatment of the illness. Amenorrhea may be corrected with medicines, lifestyle changes, or surgery. If the amenorrhea cannot be corrected, it is sometimes possible to create a  false menstruation with medicines. Follow these instructions at home:  Maintain a healthy diet.  Manage weight problems.  Exercise regularly but not excessively.  Get adequate sleep.  Manage stress.  Be aware of changes in your menstrual cycle. Keep a record of when your periods occur. Note the date your period starts, how long it lasts, and any problems. Contact a health care provider if: Your symptoms do not get better with treatment. This information is not intended to replace advice given to you by your health care provider. Make sure you discuss any questions you have with your health care provider. Document Released: 01/04/2007 Document Revised: 04/30/2016 Document Reviewed: 05/11/2013 Elsevier Interactive Patient Education  2018 Elsevier Inc.  

## 2018-10-03 NOTE — MAU Provider Note (Addendum)
Chief Complaint: Possible Pregnancy   First Provider Initiated Contact with Patient 10/03/18 2017      SUBJECTIVE HPI: Tanya Moreno is a 27 y.o. [id number] who presents to maternity admissions for amenorrhea. She states she had a period on 8/28 but hasn't had one since. She reports negative pregnancy tests at home. She denies any pain or vaginal bleeding.     Past Medical History:  Diagnosis Date  . Chlamydia   . Genital HSV    Last outbreak Nov 2013  . Gonorrhea   . Trichomonas    Past Surgical History:  Procedure Laterality Date  . NO PAST SURGERIES     Social History   Socioeconomic History  . Marital status: Single    Spouse name: Not on file  . Number of children: Not on file  . Years of education: Not on file  . Highest education level: Not on file  Occupational History  . Not on file  Social Needs  . Financial resource strain: Not on file  . Food insecurity:    Worry: Not on file    Inability: Not on file  . Transportation needs:    Medical: Not on file    Non-medical: Not on file  Tobacco Use  . Smoking status: Former Smoker    Last attempt to quit: 2015    Years since quitting: 4.8  . Smokeless tobacco: Former Neurosurgeon    Quit date: 11/10/2014  Substance and Sexual Activity  . Alcohol use: No    Alcohol/week: 0.0 standard drinks  . Drug use: No  . Sexual activity: Yes    Partners: Male    Birth control/protection: None  Lifestyle  . Physical activity:    Days per week: Not on file    Minutes per session: Not on file  . Stress: Not on file  Relationships  . Social connections:    Talks on phone: Not on file    Gets together: Not on file    Attends religious service: Not on file    Active member of club or organization: Not on file    Attends meetings of clubs or organizations: Not on file    Relationship status: Not on file  . Intimate partner violence:    Fear of current or ex partner: Not on file    Emotionally abused: Not on file     Physically abused: Not on file    Forced sexual activity: Not on file  Other Topics Concern  . Not on file  Social History Narrative   Lives with godmother.    No current facility-administered medications on file prior to encounter.    Current Outpatient Medications on File Prior to Encounter  Medication Sig Dispense Refill  . amoxicillin-clavulanate (AUGMENTIN) 875-125 MG tablet Take 1 tablet by mouth every 12 (twelve) hours. Fill on 9/17 if symptoms not improving 14 tablet 0  . benzonatate (TESSALON) 100 MG capsule Take 1 capsule (100 mg total) by mouth every 8 (eight) hours. 21 capsule 0  . fluticasone (FLONASE) 50 MCG/ACT nasal spray Place 2 sprays into both nostrils daily. 1 g 0  . ipratropium (ATROVENT) 0.06 % nasal spray Place 2 sprays into both nostrils 4 (four) times daily. 15 mL 0   Allergies  Allergen Reactions  . Latex Swelling    Mainly latex condoms  . Tape Itching    ROS:  Review of Systems  Constitutional: Negative.  Negative for fatigue and fever.  HENT: Negative.   Respiratory: Negative.  Negative for shortness of breath.   Cardiovascular: Negative.  Negative for chest pain.  Gastrointestinal: Negative.  Negative for abdominal pain, constipation, diarrhea, nausea and vomiting.  Genitourinary: Negative.  Negative for dysuria.  Neurological: Negative.  Negative for dizziness and headaches.   I have reviewed patient's Past Medical Hx, Surgical Hx, Family Hx, Social Hx, medications and allergies.   Physical Exam   Patient Vitals for the past 24 hrs:  BP Temp Temp src Pulse Resp SpO2 Height Weight  10/03/18 2013 128/76 98.1 F (36.7 C) Oral 86 18 100 % 5\' 4"  (1.626 m) 122.5 kg    MDM Patient denies any concerning symptoms in need of emergent evaluation. Patient advised that she may wait for a room in MAU or may choose to be discharged to seek non-emergent evaluation of her complaint at [facility], Urgent care or her PCP.  UPT negative  ASSESSMENT MSE  Complete  PLAN Discharge in stable condition F/u with [facility] if symptoms continue  Judeth Horn, NP

## 2018-10-29 ENCOUNTER — Encounter (HOSPITAL_COMMUNITY): Payer: Self-pay | Admitting: Emergency Medicine

## 2018-10-29 ENCOUNTER — Ambulatory Visit (HOSPITAL_COMMUNITY)
Admission: EM | Admit: 2018-10-29 | Discharge: 2018-10-29 | Disposition: A | Discharge status: Home / Self Care | Payer: Self-pay | Attending: Family Medicine | Admitting: Family Medicine

## 2018-10-29 DIAGNOSIS — M25562 Pain in left knee: Secondary | ICD-10-CM

## 2018-10-29 DIAGNOSIS — M25561 Pain in right knee: Secondary | ICD-10-CM

## 2018-10-29 MED ORDER — DICLOFENAC SODIUM 75 MG PO TBEC: 75.0000 mg | DELAYED_RELEASE_TABLET | Freq: Two times a day (BID) | ORAL | 0 refills | Status: DC

## 2018-10-29 NOTE — ED Triage Notes (Signed)
Pt sts bilateral leg and knee pain x 1 week

## 2018-10-31 NOTE — ED Provider Notes (Signed)
[facility]   [id number] 10/29/18 Arrival Time: 1632  ASSESSMENT & PLAN:  1. Pain in both knees, unspecified chronicity     Meds ordered this encounter  Medications  . diclofenac (VOLTAREN) 75 MG EC tablet    Sig: Take 1 tablet (75 mg total) by mouth 2 (two) times daily.    Dispense:  20 tablet    Refill:  0     Follow-up Information    [facility].   Specialty:  Urgent Care Why:  As needed. Contact information: [address] [place] [id number] [phone]          Rest the injured area as much as practical. Reviewed expectations re: course of current medical issues. Questions answered. Outlined signs and symptoms indicating need for more acute intervention. Patient verbalized understanding. After Visit Summary given.  SUBJECTIVE: History from: patient. Tanya Moreno is a 27 y.o. female who reports intermittent moderate pain of her bilateral knees; described as aching without radiation. No injury; gradual onset over a few weeks; new job 4 months ago, housekeeping. Standing a lot. No swelling reported. No h/o similar. Symptoms have progressed to a point and plateaued since beginning. Relieved by: rest or having a few days off work. Worsened by: prolonged standing/walking. Associated symptoms: none reported. Extremity sensation changes or weakness: none. Self treatment: occasional OTC ibuprofen without much help; irregular use  Past Surgical History:  Procedure Laterality Date  . NO PAST SURGERIES       ROS: As per HPI.   OBJECTIVE:  Vitals:   10/29/18 1704  BP: (!) 149/83  Pulse: 76  Resp: 18  Temp: 97.9 F (36.6 C)  TempSrc: Oral  SpO2: 100%    General appearance: alert; no distress; obese Extremities: . RLE/LLE: warm and well perfused; poorly localized mild to moderate tenderness over her R and L knees; without gross deformities; with no swelling; with no bruising; ROM:  normal CV: brisk extremity capillary refill of RLE and LLE; 2+ DP and PT pulses of RLE and LLE. Skin: warm and dry; no visible rashes Neurologic: gait normal; normal reflexes of RLE and LLE; normal sensation of RLE and LLE; normal strength of RLE and LLE Psychological: alert and cooperative; normal mood and affect  Allergies  Allergen Reactions  . Latex Swelling    Mainly latex condoms  . Tape Itching    Past Medical History:  Diagnosis Date  . Chlamydia   . Genital HSV    Last outbreak Nov 2013  . Gonorrhea   . Trichomonas    Social History   Socioeconomic History  . Marital status: Single    Spouse name: Not on file  . Number of children: Not on file  . Years of education: Not on file  . Highest education level: Not on file  Occupational History  . Not on file  Social Needs  . Financial resource strain: Not on file  . Food insecurity:    Worry: Not on file    Inability: Not on file  . Transportation needs:    Medical: Not on file    Non-medical: Not on file  Tobacco Use  . Smoking status: Former Smoker    Last attempt to quit: 2015    Years since quitting: 4.9  . Smokeless tobacco: Former NeurosurgeonUser    Quit date: 11/10/2014  Substance and Sexual Activity  . Alcohol use: No    Alcohol/week: 0.0 standard drinks  . Drug use:  No  . Sexual activity: Yes    Partners: Male    Birth control/protection: None  Lifestyle  . Physical activity:    Days per week: Not on file    Minutes per session: Not on file  . Stress: Not on file  Relationships  . Social connections:    Talks on phone: Not on file    Gets together: Not on file    Attends religious service: Not on file    Active member of club or organization: Not on file    Attends meetings of clubs or organizations: Not on file    Relationship status: Not on file  Other Topics Concern  . Not on file  Social History Narrative   Lives with godmother.    Family History  Problem Relation Age of Onset  . Heart  disease Father   . Kidney disease Maternal Aunt    Past Surgical History:  Procedure Laterality Date  . NO PAST SURGERIES        Mardella Layman, MD 10/31/18 [phone]

## 2018-12-07 NOTE — L&D Delivery Note (Signed)
Delivery Note Progressed to complete dilation and pushed once to delivery  At 4:22 AM a viable and healthy female was delivered via Vaginal, Spontaneous (Presentation: OA ).  APGAR: , ; weight  .   Placenta status: Spontaneous and grossly intact with 3 vessel Cord:  with the following complications: none  Anesthesia:  none Episiotomy: None Lacerations: None Suture Repair: none Est. Blood Loss (mL):  100  Mom to postpartum.  Baby to Couplet care / Skin to Skin.  Hansel Feinstein 07/23/2019, 4:34 AM   [facility] Please schedule this patient for Postpartum visit in: 1 week with the following provider: Any provider For C/S patients schedule nurse incision check in weeks 2 weeks: no High risk pregnancy complicated by: HTN Delivery mode:  SVD Anticipated Birth Control:  Depo PP Procedures needed: BP check  Schedule Integrated [place] visit: no

## 2018-12-10 ENCOUNTER — Inpatient Hospital Stay (HOSPITAL_COMMUNITY)
Admission: AD | Admit: 2018-12-10 | Discharge: 2018-12-10 | Disposition: A | Discharge status: Home / Self Care | Payer: Medicaid Other | Source: Ambulatory Visit | Attending: Obstetrics & Gynecology | Admitting: Obstetrics & Gynecology

## 2018-12-10 ENCOUNTER — Encounter (HOSPITAL_COMMUNITY): Payer: Self-pay | Admitting: *Deleted

## 2018-12-10 ENCOUNTER — Other Ambulatory Visit: Payer: Self-pay

## 2018-12-10 DIAGNOSIS — Z87891 Personal history of nicotine dependence: Secondary | ICD-10-CM | POA: Diagnosis not present

## 2018-12-10 DIAGNOSIS — I10 Essential (primary) hypertension: Secondary | ICD-10-CM | POA: Diagnosis not present

## 2018-12-10 DIAGNOSIS — O10911 Unspecified pre-existing hypertension complicating pregnancy, first trimester: Secondary | ICD-10-CM

## 2018-12-10 DIAGNOSIS — N912 Amenorrhea, unspecified: Secondary | ICD-10-CM | POA: Diagnosis present

## 2018-12-10 DIAGNOSIS — Z3201 Encounter for pregnancy test, result positive: Secondary | ICD-10-CM

## 2018-12-10 DIAGNOSIS — O10919 Unspecified pre-existing hypertension complicating pregnancy, unspecified trimester: Secondary | ICD-10-CM

## 2018-12-10 LAB — URINALYSIS, ROUTINE W REFLEX MICROSCOPIC
Bilirubin Urine: NEGATIVE
GLUCOSE, UA: NEGATIVE mg/dL
HGB URINE DIPSTICK: NEGATIVE
KETONES UR: NEGATIVE mg/dL
Leukocytes, UA: NEGATIVE
NITRITE: NEGATIVE
PH: 6 (ref 5.0–8.0)
PROTEIN: NEGATIVE mg/dL
SPECIFIC GRAVITY, URINE: 1.021 (ref 1.005–1.030)

## 2018-12-10 LAB — POCT PREGNANCY, URINE: Preg Test, Ur: POSITIVE — AB

## 2018-12-10 MED ORDER — LABETALOL HCL 100 MG PO TABS: 100.0000 mg | ORAL_TABLET | Freq: Two times a day (BID) | ORAL | 5 refills | Status: DC

## 2018-12-10 NOTE — MAU Provider Note (Signed)
Chief Complaint: Amenorrhea   None     SUBJECTIVE HPI: Tanya Moreno is a 28 y.o. G3P3003 at 8518w5d by LMP who presents to maternity admissions reporting missed menses.  She was seen 10/03/18 in MAU for missed menses x 2 months and had negative pregnancy test.  Then she had 4 days of bleeding in early November but this was unusual so she is not sure if it was a period. She has not had bleeding since 11/4.  She also reports a positive home pregnancy test last night.  She denies any pain or bleeding. She has not required any treatments as there are no symptoms other than amenorrhea.   She has hx of GHTN and she reports her blood pressure is often elevated when she is not pregnant as well.    HPI  Past Medical History:  Diagnosis Date  . Chlamydia   . Genital HSV    Last outbreak Nov 2013  . Gonorrhea   . Trichomonas    Past Surgical History:  Procedure Laterality Date  . NO PAST SURGERIES     Social History   Socioeconomic History  . Marital status: Single    Spouse name: Not on file  . Number of children: Not on file  . Years of education: Not on file  . Highest education level: Not on file  Occupational History  . Not on file  Social Needs  . Financial resource strain: Not on file  . Food insecurity:    Worry: Not on file    Inability: Not on file  . Transportation needs:    Medical: Not on file    Non-medical: Not on file  Tobacco Use  . Smoking status: Former Smoker    Last attempt to quit: 2015    Years since quitting: 5.0  . Smokeless tobacco: Former NeurosurgeonUser    Quit date: 11/10/2014  Substance and Sexual Activity  . Alcohol use: No    Alcohol/week: 0.0 standard drinks  . Drug use: No  . Sexual activity: Yes    Partners: Male    Birth control/protection: None  Lifestyle  . Physical activity:    Days per week: Not on file    Minutes per session: Not on file  . Stress: Not on file  Relationships  . Social connections:    Talks on phone: Not on file    Gets  together: Not on file    Attends religious service: Not on file    Active member of club or organization: Not on file    Attends meetings of clubs or organizations: Not on file    Relationship status: Not on file  . Intimate partner violence:    Fear of current or ex partner: Not on file    Emotionally abused: Not on file    Physically abused: Not on file    Forced sexual activity: Not on file  Other Topics Concern  . Not on file  Social History Narrative   Lives with godmother.    No current facility-administered medications on file prior to encounter.    Current Outpatient Medications on File Prior to Encounter  Medication Sig Dispense Refill  . fluticasone (FLONASE) 50 MCG/ACT nasal spray Place 2 sprays into both nostrils daily. 1 g 0  . ipratropium (ATROVENT) 0.06 % nasal spray Place 2 sprays into both nostrils 4 (four) times daily. 15 mL 0   Allergies  Allergen Reactions  . Latex Swelling    Mainly latex condoms  .  Tape Itching    ROS:  Review of Systems  Constitutional: Negative for chills, fatigue and fever.  HENT: Negative for sinus pressure.   Eyes: Negative for photophobia.  Respiratory: Negative for shortness of breath.   Cardiovascular: Negative for chest pain.  Gastrointestinal: Negative for abdominal pain, nausea and vomiting.  Genitourinary: Negative for difficulty urinating, dysuria, flank pain, frequency, pelvic pain, vaginal bleeding, vaginal discharge and vaginal pain.  Musculoskeletal: Negative for neck pain.  Neurological: Negative for dizziness, weakness and headaches.  Psychiatric/Behavioral: Negative.      I have reviewed patient's Past Medical Hx, Surgical Hx, Family Hx, Social Hx, medications and allergies.   Physical Exam   Patient Vitals for the past 24 hrs:  BP Temp Temp src Pulse Resp SpO2 Weight  12/10/18 1522 (!) 141/72 - - - 18 - -  12/10/18 1500 (!) 141/72 - - - - - -  12/10/18 1222 (!) 146/84 98.3 F (36.8 C) Oral 73 18 100 %  123.9 kg   Constitutional: Well-developed, well-nourished female in no acute distress.  Cardiovascular: normal rate Respiratory: normal effort GI: Abd soft, non-tender. Pos BS x 4 MS: Extremities nontender, no edema, normal ROM Neurologic: Alert and oriented x 4.  GU: Neg CVAT.    LAB RESULTS Results for orders placed or performed during the hospital encounter of 12/10/18 (from the past 24 hour(s))  Urinalysis, Routine w reflex microscopic     Status: Abnormal   Collection Time: 12/10/18 12:28 PM  Result Value Ref Range   Color, Urine YELLOW YELLOW   APPearance HAZY (A) CLEAR   Specific Gravity, Urine 1.021 1.005 - 1.030   pH 6.0 5.0 - 8.0   Glucose, UA NEGATIVE NEGATIVE mg/dL   Hgb urine dipstick NEGATIVE NEGATIVE   Bilirubin Urine NEGATIVE NEGATIVE   Ketones, ur NEGATIVE NEGATIVE mg/dL   Protein, ur NEGATIVE NEGATIVE mg/dL   Nitrite NEGATIVE NEGATIVE   Leukocytes, UA NEGATIVE NEGATIVE  Pregnancy, urine POC     Status: Abnormal   Collection Time: 12/10/18 12:34 PM  Result Value Ref Range   Preg Test, Ur POSITIVE (A) NEGATIVE       IMAGING No results found.  MAU Management/MDM: Urine pregnancy test positive today. Pt without other symptoms.  Discussed results of test. Pt given list of providers.  With pt report of HTN outside of pregnancy and mildly elevated BP today, will start pt on low dose labetalol.  Rx for labetalol 100 mg BID.  Pt to start early prenatal care. Return to MAU with emergencies.   Pt discharged with strict return precautions.  ASSESSMENT 1. Chronic hypertension affecting pregnancy   2. Positive pregnancy test     PLAN Discharge home Allergies as of 12/10/2018      Reactions   Latex Swelling   Mainly latex condoms   Tape Itching      Medication List    STOP taking these medications   benzonatate 100 MG capsule Commonly known as:  TESSALON   diclofenac 75 MG EC tablet Commonly known as:  VOLTAREN     TAKE these medications    fluticasone 50 MCG/ACT nasal spray Commonly known as:  FLONASE Place 2 sprays into both nostrils daily.   ipratropium 0.06 % nasal spray Commonly known as:  ATROVENT Place 2 sprays into both nostrils 4 (four) times daily.   labetalol 100 MG tablet Commonly known as:  NORMODYNE Take 1 tablet (100 mg total) by mouth 2 (two) times daily.      Follow-up  Information    [facility].   Specialty:  Obstetrics and Gynecology Why:  Or provider of your choice, see list provided. Return to MAU as needed for emergencies. Contact information: [address], [address] [place] [id number] [phone]          Sharen CounterLisa Leftwich-Kirby Certified Nurse-Midwife 12/10/2018  4:12 PM

## 2018-12-10 NOTE — MAU Note (Signed)
Tanya Moreno is a 28 y.o.  here in MAU reporting: was seen 2 months ago and had a negative pregnancy test. States had a period afterward for 4 days which is unusual for her. Reports has not had another one since.  Pain score: denies Vitals:   12/10/18 1222  BP: (!) 146/84  Pulse: 73  Resp: 18  Temp: 98.3 F (36.8 C)  SpO2: 100%       Lab orders placed from triage: ua and pregnancy test

## 2018-12-10 NOTE — Discharge Instructions (Signed)
[place] [facility]  ° ° °[facility]       Phone: [phone] ° °[facility] Phone: [phone] ° °[facility] [place]  Phone: [phone] ° °[facility] [place]  Phone: [phone] ° °[facility]  Phone: [phone] ° °[facility]       Phone: [phone] ° °[facility]    Phone: [phone]  ° °[facility]    Phone: [phone] ° °[facility]    Phone: [phone] ° °[facility]    Phone: [phone] ° °[facility]    Phone: [phone] ° °[facility]       Phone: [phone]  ° °[facility]  Phone: [phone] ° °[facility]    Phone: [phone] ° °[facility]   Phone: [phone] ° °[facility]      Phone: [phone] ° °[facility]    Phone: [phone] ° °

## 2018-12-29 DIAGNOSIS — Z3490 Encounter for supervision of normal pregnancy, unspecified, unspecified trimester: Secondary | ICD-10-CM | POA: Insufficient documentation

## 2018-12-30 ENCOUNTER — Other Ambulatory Visit: Payer: Self-pay

## 2018-12-30 ENCOUNTER — Encounter: Payer: Self-pay | Admitting: Advanced Practice Midwife

## 2018-12-30 ENCOUNTER — Ambulatory Visit (INDEPENDENT_AMBULATORY_CARE_PROVIDER_SITE_OTHER): Payer: Commercial Managed Care - PPO | Admitting: Advanced Practice Midwife

## 2018-12-30 VITALS — BP 113/75 | HR 73 | Wt 270.7 lb

## 2018-12-30 DIAGNOSIS — N898 Other specified noninflammatory disorders of vagina: Secondary | ICD-10-CM

## 2018-12-30 DIAGNOSIS — Z91040 Latex allergy status: Secondary | ICD-10-CM

## 2018-12-30 DIAGNOSIS — Z1151 Encounter for screening for human papillomavirus (HPV): Secondary | ICD-10-CM | POA: Diagnosis not present

## 2018-12-30 DIAGNOSIS — Z3490 Encounter for supervision of normal pregnancy, unspecified, unspecified trimester: Secondary | ICD-10-CM | POA: Diagnosis not present

## 2018-12-30 DIAGNOSIS — O219 Vomiting of pregnancy, unspecified: Secondary | ICD-10-CM

## 2018-12-30 DIAGNOSIS — O09211 Supervision of pregnancy with history of pre-term labor, first trimester: Secondary | ICD-10-CM

## 2018-12-30 DIAGNOSIS — Z124 Encounter for screening for malignant neoplasm of cervix: Secondary | ICD-10-CM

## 2018-12-30 DIAGNOSIS — Z8759 Personal history of other complications of pregnancy, childbirth and the puerperium: Secondary | ICD-10-CM | POA: Diagnosis not present

## 2018-12-30 DIAGNOSIS — Z3491 Encounter for supervision of normal pregnancy, unspecified, first trimester: Secondary | ICD-10-CM

## 2018-12-30 DIAGNOSIS — Z6841 Body Mass Index (BMI) 40.0 and over, adult: Secondary | ICD-10-CM | POA: Diagnosis not present

## 2018-12-30 DIAGNOSIS — Z113 Encounter for screening for infections with a predominantly sexual mode of transmission: Secondary | ICD-10-CM | POA: Diagnosis not present

## 2018-12-30 DIAGNOSIS — O2641 Herpes gestationis, first trimester: Secondary | ICD-10-CM

## 2018-12-30 MED ORDER — PROMETHAZINE HCL 25 MG PO TABS: 25.0000 mg | ORAL_TABLET | Freq: Four times a day (QID) | ORAL | 2 refills | Status: DC | PRN

## 2018-12-30 MED ORDER — VITAFOL GUMMIES 3.33-0.333-34.8 MG PO CHEW: 1.0000 | CHEWABLE_TABLET | Freq: Every day | ORAL | 3 refills | Status: DC

## 2018-12-30 MED ORDER — ASPIRIN EC 81 MG PO TBEC: 81.0000 mg | DELAYED_RELEASE_TABLET | Freq: Every day | ORAL | 2 refills | Status: DC

## 2018-12-30 NOTE — Progress Notes (Signed)
Subjective:    Tanya Moreno is being seen today for her first obstetrical visit.  This is a planned pregnancy. She is at [redacted]w[redacted]d gestation by uncertain LMP. Her obstetrical history is significant for group B strep colonizer, large for gestational age, obesity, pregnancy induced hypertension and HSV. Relationship with FOB: significant other, living together. Patient does not intend to breast feed. Pregnancy history fully reviewed.  Patient reports constant nausea and vomiting TID. Hasn't tried anything for it. Gummies that Dr. Clearance Coots Rx last time helped. May need stronger med occasionally too.   Review of Systems:   Review of Systems  Constitutional: Positive for appetite change. Negative for chills and fever.  Gastrointestinal: Positive for nausea and vomiting. Negative for abdominal pain and diarrhea.  Genitourinary: Negative for dysuria, vaginal bleeding and vaginal discharge.    Objective:     BP 113/75   Pulse 73   Wt 270 lb 11.2 oz (122.8 kg)   LMP 10/10/2018   BMI 46.47 kg/m  Physical Exam  Nursing note and vitals reviewed. Constitutional: She is oriented to person, place, and time. She appears well-developed and well-nourished. No distress.  Eyes: No scleral icterus.  Cardiovascular: Normal rate.  Respiratory: Effort normal. No respiratory distress.  GI: Soft. There is no abdominal tenderness. There is no guarding.  Genitourinary:    Uterus normal.  There is no rash on the right labia. There is no rash on the left labia.    Vaginal discharge (curdlike) present.   Musculoskeletal:        General: No tenderness or edema.  Neurological: She is alert and oriented to person, place, and time. She has normal reflexes.  Skin: Skin is warm and dry.  Psychiatric: She has a normal mood and affect.    Maternal Exam:  Introitus: Vagina is positive for vaginal discharge (curdlike).    Fetal Exam Fetal Monitor Review: Mode: hand-held doppler probe.   Baseline rate: 166.          Assessment:    Pregnancy: [id number] Patient Active Problem List   Diagnosis Date Noted  . Supervision of normal pregnancy, antepartum 12/29/2018  . History of gestational hypertension 10/15/2017  . LGA (large for gestational age) fetus, third trimester 10/04/2017  . Positive GBS test 09/24/2017  . Carpal tunnel syndrome during pregnancy 08/24/2017  . History of HSV 04/14/2017  . BMI 40.0-44.9, adult (HCC) 04/14/2017  . Obesity in pregnancy, antepartum 04/14/2017  . Chlamydia infection affecting pregnancy in first trimester 02/09/2017  . Latex allergy 08/26/2011       Plan:  1. Encounter for supervision of normal pregnancy, antepartum, unspecified gravidity  - Cytology - PAP( [place]) - Enroll Patient in Babyscripts - Cervicovaginal ancillary only( [place]) - SMN1 COPY NUMBER ANALYSIS (SMA Carrier Screen) - Genetic Screening - Obstetric Panel, Including HIV - Hemoglobinopathy evaluation - Culture, OB Urine - Protein / creatinine ratio, urine - Hemoglobin A1c - aspirin EC 81 MG tablet; Take 1 tablet (81 mg total) by mouth daily. Take after 12 weeks for prevention of preeclampssia later in pregnancy  Dispense: 300 tablet; Refill: 2  2. History of gestational hypertension  - Protein / creatinine ratio, urine - aspirin EC 81 MG tablet; Take 1 tablet (81 mg total) by mouth daily. Take after 12 weeks for prevention of preeclampssia later in pregnancy  Dispense: 300 tablet; Refill: 2  3. BMI 40.0-44.9, adult (HCC) - Limit wt gain - Healthy diet  - Hemoglobin A1c  4. History of HSV -  Valtrex at 34 weeks  5. Latex allergy - Avoid latex    Initial labs drawn. Prenatal vitamins. Problem list reviewed and updated. NIPS discussed: ordered. Role of ultrasound in pregnancy discussed; fetal survey: requested. Amniocentesis discussed: not indicated. Follow up in 4 weeks. Discussed clinic routines, schedule of care and testing, genetic screening options,  involvement of students and residents under the direct supervision of APPs and doctors and presence of female providers. Pt verbalized understanding.    Tanya Moreno 12/30/2018

## 2018-12-30 NOTE — Progress Notes (Signed)
NEW OB. Declined FLU.  C/o nausea.

## 2018-12-31 LAB — PROTEIN / CREATININE RATIO, URINE
Creatinine, Urine: 169.9 mg/dL
Protein, Ur: 35.8 mg/dL
Protein/Creat Ratio: 211 mg/g creat — ABNORMAL HIGH (ref 0–200)

## 2019-01-01 LAB — URINE CULTURE, OB REFLEX

## 2019-01-01 LAB — CULTURE, OB URINE

## 2019-01-02 NOTE — Addendum Note (Signed)
Addended by: Maretta Bees on: 01/02/2019 01:47 PM   Modules accepted: Orders

## 2019-01-03 LAB — CYTOLOGY - PAP
DIAGNOSIS: NEGATIVE
HPV: DETECTED — AB

## 2019-01-03 LAB — CERVICOVAGINAL ANCILLARY ONLY
BACTERIAL VAGINITIS: NEGATIVE
CANDIDA VAGINITIS: NEGATIVE
CHLAMYDIA, DNA PROBE: NEGATIVE
Neisseria Gonorrhea: NEGATIVE
Trichomonas: NEGATIVE

## 2019-01-06 ENCOUNTER — Ambulatory Visit (HOSPITAL_COMMUNITY): Admission: RE | Admit: 2019-01-06 | Payer: Commercial Managed Care - PPO | Source: Ambulatory Visit

## 2019-01-06 ENCOUNTER — Ambulatory Visit (HOSPITAL_COMMUNITY)
Admission: RE | Admit: 2019-01-06 | Discharge: 2019-01-06 | Disposition: A | Discharge status: Home / Self Care | Payer: Commercial Managed Care - PPO | Source: Ambulatory Visit | Attending: Advanced Practice Midwife | Admitting: Advanced Practice Midwife

## 2019-01-06 DIAGNOSIS — Z3491 Encounter for supervision of normal pregnancy, unspecified, first trimester: Secondary | ICD-10-CM | POA: Insufficient documentation

## 2019-01-06 DIAGNOSIS — Z3490 Encounter for supervision of normal pregnancy, unspecified, unspecified trimester: Secondary | ICD-10-CM | POA: Insufficient documentation

## 2019-01-08 ENCOUNTER — Encounter: Payer: Self-pay | Admitting: Advanced Practice Midwife

## 2019-01-08 DIAGNOSIS — R87618 Other abnormal cytological findings on specimens from cervix uteri: Secondary | ICD-10-CM | POA: Insufficient documentation

## 2019-01-08 DIAGNOSIS — R8789 Other abnormal findings in specimens from female genital organs: Secondary | ICD-10-CM | POA: Insufficient documentation

## 2019-01-09 ENCOUNTER — Telehealth: Payer: Self-pay

## 2019-01-09 LAB — OBSTETRIC PANEL, INCLUDING HIV
ANTIBODY SCREEN: NEGATIVE
BASOS: 1 %
Basophils Absolute: 0 10*3/uL (ref 0.0–0.2)
EOS (ABSOLUTE): 0.1 10*3/uL (ref 0.0–0.4)
EOS: 1 %
HEMATOCRIT: 41.3 % (ref 34.0–46.6)
HEMOGLOBIN: 14.4 g/dL (ref 11.1–15.9)
HEP B S AG: NEGATIVE
HIV Screen 4th Generation wRfx: NONREACTIVE
IMMATURE GRANS (ABS): 0 10*3/uL (ref 0.0–0.1)
Immature Granulocytes: 0 %
LYMPHS: 46 %
Lymphocytes Absolute: 2.9 10*3/uL (ref 0.7–3.1)
MCH: 30.4 pg (ref 26.6–33.0)
MCHC: 34.9 g/dL (ref 31.5–35.7)
MCV: 87 fL (ref 79–97)
MONOCYTES: 7 %
MONOS ABS: 0.4 10*3/uL (ref 0.1–0.9)
Neutrophils Absolute: 2.7 10*3/uL (ref 1.4–7.0)
Neutrophils: 45 %
Platelets: 371 10*3/uL (ref 150–450)
RBC: 4.74 x10E6/uL (ref 3.77–5.28)
RDW: 12.3 % (ref 11.7–15.4)
RH TYPE: POSITIVE
RPR: NONREACTIVE
RUBELLA: 1.7 {index} (ref >0.99)
WBC: 6.1 10*3/uL (ref 3.4–10.8)

## 2019-01-09 LAB — SMN1 COPY NUMBER ANALYSIS (SMA CARRIER SCREENING)

## 2019-01-09 LAB — HEMOGLOBINOPATHY EVALUATION
HEMOGLOBIN A2 QUANTITATION: 3 % (ref 1.8–3.2)
HGB C: 0 %
HGB S: 0 %
HGB VARIANT: 0 %
Hemoglobin F Quantitation: 0 % (ref 0.0–2.0)
Hgb A: 97 % (ref 96.4–98.8)

## 2019-01-09 LAB — HEMOGLOBIN A1C
Est. average glucose Bld gHb Est-mCnc: 103 mg/dL
Hgb A1c MFr Bld: 5.2 % (ref 4.8–5.6)

## 2019-01-09 NOTE — Telephone Encounter (Signed)
-----   Message from [place] sent at 01/08/2019  4:20 PM EST ----- Repeat Pap in 1 year (at age 28)

## 2019-01-09 NOTE — Telephone Encounter (Signed)
Advised patient that her pap was normal and to follow up in one year. Patient verbalized understanding and asked about her panorama results, advised patient that we did not have the results and to ask at her next OB Visit. Patient verbalized understanding and had no questions.

## 2019-01-09 NOTE — Telephone Encounter (Signed)
LVM asking patient to call the office back for test results.

## 2019-01-15 ENCOUNTER — Encounter (HOSPITAL_COMMUNITY): Payer: Self-pay | Admitting: Emergency Medicine

## 2019-01-15 ENCOUNTER — Ambulatory Visit (HOSPITAL_COMMUNITY)
Admission: EM | Admit: 2019-01-15 | Discharge: 2019-01-15 | Disposition: A | Discharge status: Home / Self Care | Payer: Commercial Managed Care - PPO | Attending: Internal Medicine | Admitting: Internal Medicine

## 2019-01-15 DIAGNOSIS — Z331 Pregnant state, incidental: Secondary | ICD-10-CM | POA: Diagnosis not present

## 2019-01-15 DIAGNOSIS — R69 Illness, unspecified: Principal | ICD-10-CM

## 2019-01-15 DIAGNOSIS — J111 Influenza due to unidentified influenza virus with other respiratory manifestations: Secondary | ICD-10-CM

## 2019-01-15 MED ORDER — OSELTAMIVIR PHOSPHATE 75 MG PO CAPS: 75.0000 mg | ORAL_CAPSULE | Freq: Two times a day (BID) | ORAL | 0 refills | Status: DC

## 2019-01-15 NOTE — ED Provider Notes (Signed)
[facility]    CSN: [id number] Arrival date & time: 01/15/19  1129     History   Chief Complaint Chief Complaint  Patient presents with  . URI    HPI Tanya Moreno is a 28 y.o. female.   28 year old female with no chronic medical problems except for pregnancy at this current time presents to urgent care complaining of sore throat, cough and body aches.  The patient may have had a subjective fever a few days ago.  Her cough is productive of thin white phlegm.  She also denies shortness of breath.  She denies nausea, vomiting or diarrhea.     Past Medical History:  Diagnosis Date  . Chlamydia   . Genital HSV    Last outbreak Nov 2013  . Gonorrhea   . Trichomonas     Patient Active Problem List   Diagnosis Date Noted  . Abnormal Papanicolaou smear of cervix with positive human papilloma virus (HPV) test 01/08/2019  . Supervision of normal pregnancy, antepartum 12/29/2018  . History of gestational hypertension 10/15/2017  . LGA (large for gestational age) fetus, third trimester 10/04/2017  . Positive GBS test 09/24/2017  . Carpal tunnel syndrome during pregnancy 08/24/2017  . History of HSV 04/14/2017  . BMI 40.0-44.9, adult (HCC) 04/14/2017  . Obesity in pregnancy, antepartum 04/14/2017  . Chlamydia infection affecting pregnancy in first trimester 02/09/2017  . Latex allergy 08/26/2011    Past Surgical History:  Procedure Laterality Date  . NO PAST SURGERIES      OB History    Gravida  4   Para  3   Term  3   Preterm      AB      Living  3     SAB      TAB      Ectopic      Multiple  0   Live Births  3            Home Medications    Prior to Admission medications   Medication Sig Start Date End Date Taking? Authorizing Provider  aspirin EC 81 MG tablet Take 1 tablet (81 mg total) by mouth daily. Take after 12 weeks for prevention of preeclampssia later in pregnancy 12/30/18   Katrinka Blazing, [place], CNM  fluticasone [facility]) 50  MCG/ACT nasal spray Place 2 sprays into both nostrils daily. Patient not taking: Reported on 12/30/2018 08/20/18   Belinda Fisher, PA-C  ipratropium (ATROVENT) 0.06 % nasal spray Place 2 sprays into both nostrils 4 (four) times daily. Patient not taking: Reported on 12/30/2018 08/20/18   Belinda Fisher, PA-C  labetalol (NORMODYNE) 100 MG tablet Take 1 tablet (100 mg total) by mouth 2 (two) times daily. Patient not taking: Reported on 12/30/2018 12/10/18   Hurshel Party, CNM  oseltamivir (TAMIFLU) 75 MG capsule Take 1 capsule (75 mg total) by mouth every 12 (twelve) hours. 01/15/19   Arnaldo Natal, MD  Prenatal Vit-Fe Phos-FA-Omega (VITAFOL GUMMIES) 3.33-0.333-34.8 MG CHEW Chew 1 tablet by mouth daily. 12/30/18   Katrinka Blazing, [place], CNM  promethazine (PHENERGAN) 25 MG tablet Take 1 tablet (25 mg total) by mouth every 6 (six) hours as needed for nausea or vomiting. 12/30/18   Dorathy Kinsman, CNM    Family History Family History  Problem Relation Age of Onset  . Heart disease Father   . Kidney disease Maternal Aunt     Social History Social History   Tobacco Use  . Smoking status: Former  Smoker    Last attempt to quit: 2015    Years since quitting: 5.1  . Smokeless tobacco: Former NeurosurgeonUser    Quit date: 11/10/2014  Substance Use Topics  . Alcohol use: No    Alcohol/week: 0.0 standard drinks  . Drug use: No     Allergies   Latex and Tape   Review of Systems Review of Systems  Constitutional: Negative for chills and fever.  HENT: Negative for sore throat and tinnitus.   Eyes: Negative for redness.  Respiratory: Positive for cough. Negative for shortness of breath.   Cardiovascular: Negative for chest pain and palpitations.  Gastrointestinal: Negative for abdominal pain, diarrhea, nausea and vomiting.  Genitourinary: Negative for dysuria, frequency and urgency.  Musculoskeletal: Negative for myalgias.  Skin: Negative for rash.       No lesions  Neurological: Negative for weakness.    Hematological: Does not bruise/bleed easily.  Psychiatric/Behavioral: Negative for suicidal ideas.     Physical Exam Triage Vital Signs ED Triage Vitals [01/15/19 1227]  Enc Vitals Group     BP 138/70     Pulse Rate 95     Resp 18     Temp 98.5 F (36.9 C)     Temp Source Temporal     SpO2 100 %     Weight      Height      Head Circumference      Peak Flow      Pain Score 5     Pain Loc      Pain Edu?      Excl. in GC?    No data found.  Updated Vital Signs BP 138/70 (BP Location: Right Arm)   Pulse 95   Temp 98.5 F (36.9 C) (Temporal)   Resp 18   LMP 10/10/2018   SpO2 100%   Visual Acuity Right Eye Distance:   Left Eye Distance:   Bilateral Distance:    Right Eye Near:   Left Eye Near:    Bilateral Near:     Physical Exam Vitals signs and nursing note reviewed.  Constitutional:      General: She is not in acute distress.    Appearance: She is well-developed.  HENT:     Head: Normocephalic and atraumatic.  Eyes:     General: No scleral icterus.    Conjunctiva/sclera: Conjunctivae normal.     Pupils: Pupils are equal, round, and reactive to light.  Neck:     Musculoskeletal: Normal range of motion and neck supple.     Thyroid: No thyromegaly.     Vascular: No JVD.     Trachea: No tracheal deviation.  Cardiovascular:     Rate and Rhythm: Normal rate and regular rhythm.     Heart sounds: Normal heart sounds. No murmur. No friction rub. No gallop.   Pulmonary:     Effort: Pulmonary effort is normal.     Breath sounds: Normal breath sounds.  Abdominal:     General: Bowel sounds are normal. There is no distension.     Palpations: Abdomen is soft.     Tenderness: There is no abdominal tenderness.     Comments: Gravid abdomen  Musculoskeletal: Normal range of motion.  Lymphadenopathy:     Cervical: No cervical adenopathy.  Skin:    General: Skin is warm and dry.  Neurological:     Mental Status: She is alert and oriented to person, place, and  time.     Cranial Nerves: No  cranial nerve deficit.  Psychiatric:        Behavior: Behavior normal.        Thought Content: Thought content normal.        Judgment: Judgment normal.      UC Treatments / Results  Labs (all labs ordered are listed, but only abnormal results are displayed) Labs Reviewed - No data to display  EKG None  Radiology No results found.  Procedures Procedures (including critical care time)  Medications Ordered in UC Medications - No data to display  Initial Impression / Assessment and Plan / UC Course  I have reviewed the triage vital signs and the nursing notes.  Pertinent labs & imaging results that were available during my care of the patient were reviewed by me and considered in my medical decision making (see chart for details).     Symptoms consistent with flulike illness.  Benefit of Tamiflu during pregnancy outweighs risk.  Final Clinical Impressions(s) / UC Diagnoses   Final diagnoses:  Influenza-like illness   Discharge Instructions   None    ED Prescriptions    Medication Sig Dispense Auth. Provider   oseltamivir (TAMIFLU) 75 MG capsule Take 1 capsule (75 mg total) by mouth every 12 (twelve) hours. 10 capsule Arnaldo Nataliamond, Jerry Haugen S, MD     Controlled Substance Prescriptions  Controlled Substance Registry consulted? Not Applicable   Arnaldo Nataliamond, Treana Lacour S, MD 01/15/19 [phone]

## 2019-01-15 NOTE — ED Triage Notes (Signed)
Pt sts URI with body aches and cough

## 2019-01-21 ENCOUNTER — Emergency Department (HOSPITAL_COMMUNITY)
Admission: EM | Admit: 2019-01-21 | Discharge: 2019-01-21 | Disposition: A | Discharge status: Home / Self Care | Payer: Commercial Managed Care - PPO | Attending: Emergency Medicine | Admitting: Emergency Medicine

## 2019-01-21 ENCOUNTER — Other Ambulatory Visit: Payer: Self-pay

## 2019-01-21 ENCOUNTER — Encounter (HOSPITAL_COMMUNITY): Payer: Self-pay

## 2019-01-21 DIAGNOSIS — O99282 Endocrine, nutritional and metabolic diseases complicating pregnancy, second trimester: Secondary | ICD-10-CM | POA: Insufficient documentation

## 2019-01-21 DIAGNOSIS — E860 Dehydration: Secondary | ICD-10-CM | POA: Insufficient documentation

## 2019-01-21 DIAGNOSIS — Z91040 Latex allergy status: Secondary | ICD-10-CM | POA: Diagnosis not present

## 2019-01-21 DIAGNOSIS — O98512 Other viral diseases complicating pregnancy, second trimester: Secondary | ICD-10-CM | POA: Insufficient documentation

## 2019-01-21 DIAGNOSIS — O98511 Other viral diseases complicating pregnancy, first trimester: Secondary | ICD-10-CM | POA: Diagnosis not present

## 2019-01-21 DIAGNOSIS — O26812 Pregnancy related exhaustion and fatigue, second trimester: Secondary | ICD-10-CM | POA: Diagnosis present

## 2019-01-21 DIAGNOSIS — R5383 Other fatigue: Secondary | ICD-10-CM | POA: Insufficient documentation

## 2019-01-21 DIAGNOSIS — O211 Hyperemesis gravidarum with metabolic disturbance: Secondary | ICD-10-CM | POA: Diagnosis not present

## 2019-01-21 DIAGNOSIS — R69 Illness, unspecified: Secondary | ICD-10-CM

## 2019-01-21 DIAGNOSIS — Z3A14 14 weeks gestation of pregnancy: Secondary | ICD-10-CM | POA: Diagnosis not present

## 2019-01-21 DIAGNOSIS — J111 Influenza due to unidentified influenza virus with other respiratory manifestations: Secondary | ICD-10-CM | POA: Diagnosis not present

## 2019-01-21 DIAGNOSIS — O219 Vomiting of pregnancy, unspecified: Secondary | ICD-10-CM

## 2019-01-21 DIAGNOSIS — O218 Other vomiting complicating pregnancy: Secondary | ICD-10-CM | POA: Diagnosis not present

## 2019-01-21 DIAGNOSIS — Z87891 Personal history of nicotine dependence: Secondary | ICD-10-CM | POA: Insufficient documentation

## 2019-01-21 LAB — CBC WITH DIFFERENTIAL/PLATELET
Abs Immature Granulocytes: 0.05 10*3/uL (ref 0.00–0.07)
Basophils Absolute: 0 10*3/uL (ref 0.0–0.1)
Basophils Relative: 0 %
Eosinophils Absolute: 0 10*3/uL (ref 0.0–0.5)
Eosinophils Relative: 0 %
HCT: 41.6 % (ref 36.0–46.0)
Hemoglobin: 13.6 g/dL (ref 12.0–15.0)
Immature Granulocytes: 0 %
Lymphocytes Relative: 15 %
Lymphs Abs: 1.6 10*3/uL (ref 0.7–4.0)
MCH: 29.6 pg (ref 26.0–34.0)
MCHC: 32.7 g/dL (ref 30.0–36.0)
MCV: 90.6 fL (ref 80.0–100.0)
Monocytes Absolute: 0.8 10*3/uL (ref 0.1–1.0)
Monocytes Relative: 7 %
NEUTROS ABS: 8.8 10*3/uL — AB (ref 1.7–7.7)
Neutrophils Relative %: 78 %
Platelets: 298 10*3/uL (ref 150–400)
RBC: 4.59 MIL/uL (ref 3.87–5.11)
RDW: 12.3 % (ref 11.5–15.5)
WBC: 11.3 10*3/uL — ABNORMAL HIGH (ref 4.0–10.5)
nRBC: 0 % (ref 0.0–0.2)

## 2019-01-21 LAB — COMPREHENSIVE METABOLIC PANEL
ALBUMIN: 4.1 g/dL (ref 3.5–5.0)
ALT: 10 U/L (ref 0–44)
AST: 17 U/L (ref 15–41)
Alkaline Phosphatase: 65 U/L (ref 38–126)
Anion gap: 10 (ref 5–15)
BILIRUBIN TOTAL: 0.7 mg/dL (ref 0.3–1.2)
BUN: 5 mg/dL — ABNORMAL LOW (ref 6–20)
CHLORIDE: 105 mmol/L (ref 98–111)
CO2: 23 mmol/L (ref 22–32)
Calcium: 9.5 mg/dL (ref 8.9–10.3)
Creatinine, Ser: 0.64 mg/dL (ref 0.44–1.00)
GFR calc Af Amer: >60 mL/min (ref >60)
GFR calc non Af Amer: >60 mL/min (ref >60)
Glucose, Bld: 81 mg/dL (ref 70–99)
Potassium: 3.2 mmol/L — ABNORMAL LOW (ref 3.5–5.1)
Sodium: 138 mmol/L (ref 135–145)
Total Protein: 8.7 g/dL — ABNORMAL HIGH (ref 6.5–8.1)

## 2019-01-21 LAB — URINALYSIS, ROUTINE W REFLEX MICROSCOPIC
BILIRUBIN URINE: NEGATIVE
Bacteria, UA: NONE SEEN
Glucose, UA: NEGATIVE mg/dL
Hgb urine dipstick: NEGATIVE
Ketones, ur: 80 mg/dL — AB
LEUKOCYTE UA: NEGATIVE
Nitrite: NEGATIVE
PH: 7 (ref 5.0–8.0)
Protein, ur: 30 mg/dL — AB
Specific Gravity, Urine: 1.019 (ref 1.005–1.030)

## 2019-01-21 MED ORDER — POTASSIUM CHLORIDE CRYS ER 20 MEQ PO TBCR
40.0000 meq | EXTENDED_RELEASE_TABLET | Freq: Once | ORAL | Status: AC
  Administered 2019-01-21: 40 meq via ORAL
  Filled 2019-01-21: qty 2

## 2019-01-21 MED ORDER — SODIUM CHLORIDE 0.9 % IV BOLUS
1000.0000 mL | Freq: Once | INTRAVENOUS | Status: AC
  Administered 2019-01-21: 1000 mL via INTRAVENOUS

## 2019-01-21 MED ORDER — ACETAMINOPHEN 325 MG PO TABS
650.0000 mg | ORAL_TABLET | Freq: Once | ORAL | Status: AC
  Administered 2019-01-21: 650 mg via ORAL
  Filled 2019-01-21: qty 2

## 2019-01-21 MED ORDER — DOXYLAMINE SUCCINATE (SLEEP) 25 MG PO TABS: 25.0000 mg | ORAL_TABLET | Freq: Every evening | ORAL | 0 refills | Status: DC | PRN

## 2019-01-21 MED ORDER — VITAMIN B-6 50 MG PO TABS: 50.0000 mg | ORAL_TABLET | Freq: Every day | ORAL | 0 refills | Status: DC

## 2019-01-21 NOTE — Discharge Instructions (Signed)
Follow-up with your OB/GYN and your PCP for further evaluation. You can take Tylenol as needed for body aches. Return to the ED for worsening symptoms, severe abdominal pain, abnormal vaginal bleeding, lightheadedness or chest pain.

## 2019-01-21 NOTE — ED Provider Notes (Signed)
[facility]EMERGENCY DEPT Provider Note   CSN: [id number] Arrival date & time: 01/21/19  1020     History   Chief Complaint Chief Complaint  Patient presents with  . Influenza  . Generalized Body Aches  . Dizziness    HPI Tanya Moreno is a 28 y.o. female with is currently [redacted]w[redacted]d pregnant who presents to ED for generalized body aches, nausea, vomiting, rhinorrhea and cough for the past week.  She was seen and evaluated at urgent care on 01/15/2019 and diagnosed with flulike illness.  She was prescribed Tamiflu but she is unable to get it due to the price.  She states she feels dehydrated as she is unable to eat or drink much without vomiting.  She denies any abdominal pain, vaginal bleeding.  She had an ultrasound done for this pregnancy 3 weeks ago which was normal.  Sick contacts including her significant other with similar symptoms who was also diagnosed with flulike illness.  She denies any chest pain, shortness of breath, diarrhea, recent travel.  She did not receive influenza vaccine this year. Reports normal fetal movement.  HPI  Past Medical History:  Diagnosis Date  . Chlamydia   . Genital HSV    Last outbreak Nov 2013  . Gonorrhea   . Trichomonas     Patient Active Problem List   Diagnosis Date Noted  . Abnormal Papanicolaou smear of cervix with positive human papilloma virus (HPV) test 01/08/2019  . Supervision of normal pregnancy, antepartum 12/29/2018  . History of gestational hypertension 10/15/2017  . Carpal tunnel syndrome during pregnancy 08/24/2017  . History of HSV 04/14/2017  . BMI 40.0-44.9, adult (HCC) 04/14/2017  . Obesity in pregnancy, antepartum 04/14/2017  . Chlamydia infection affecting pregnancy in first trimester 02/09/2017  . Latex allergy 08/26/2011    Past Surgical History:  Procedure Laterality Date  . NO PAST SURGERIES       OB History    Gravida  4   Para  3   Term  3   Preterm      AB      Living  3       SAB      TAB      Ectopic      Multiple  0   Live Births  3            Home Medications    Prior to Admission medications   Medication Sig Start Date End Date Taking? Authorizing Provider  labetalol (NORMODYNE) 100 MG tablet Take 1 tablet (100 mg total) by mouth 2 (two) times daily. 12/10/18  Yes Leftwich-Kirby, Wilmer Floor, CNM  aspirin EC 81 MG tablet Take 1 tablet (81 mg total) by mouth daily. Take after 12 weeks for prevention of preeclampssia later in pregnancy Patient not taking: Reported on 01/21/2019 12/30/18   Katrinka Blazing, [place], CNM  doxylamine, Sleep, (UNISOM) 25 MG tablet Take 1 tablet (25 mg total) by mouth at bedtime as needed. 01/21/19   Camrin Gearheart, PA-C  fluticasone (FLONASE) 50 MCG/ACT nasal spray Place 2 sprays into both nostrils daily. Patient not taking: Reported on 01/21/2019 08/20/18   Belinda Fisher, PA-C  ipratropium (ATROVENT) 0.06 % nasal spray Place 2 sprays into both nostrils 4 (four) times daily. Patient not taking: Reported on 01/21/2019 08/20/18   Belinda Fisher, PA-C  oseltamivir (TAMIFLU) 75 MG capsule Take 1 capsule (75 mg total) by mouth every 12 (twelve) hours. Patient not taking: Reported on 01/21/2019 01/15/19  Arnaldo Nataliamond, Michael S, MD  Prenatal Vit-Fe Phos-FA-Omega (VITAFOL GUMMIES) 3.33-0.333-34.8 MG CHEW Chew 1 tablet by mouth daily. Patient not taking: Reported on 01/21/2019 12/30/18   Katrinka BlazingSmith, [place], CNM  promethazine (PHENERGAN) 25 MG tablet Take 1 tablet (25 mg total) by mouth every 6 (six) hours as needed for nausea or vomiting. Patient not taking: Reported on 01/21/2019 12/30/18   Katrinka BlazingSmith, [place], CNM  pyridOXINE (VITAMIN B-6) 50 MG tablet Take 1 tablet (50 mg total) by mouth daily. 01/21/19   Dietrich PatesKhatri, Oluwad[place]imi Red[place], PA-C    Family History Family History  Problem Relation Age of Onset  . Heart disease Father   . Kidney disease Maternal Aunt     Social History Social History   Tobacco Use  . Smoking status: Former Smoker    Last attempt to quit: 2015     Years since quitting: 5.1  . Smokeless tobacco: Former NeurosurgeonUser    Quit date: 11/10/2014  Substance Use Topics  . Alcohol use: No    Alcohol/week: 0.0 standard drinks  . Drug use: No     Allergies   Latex and Tape   Review of Systems Review of Systems  Constitutional: Positive for fatigue. Negative for appetite change, chills and fever.  HENT: Positive for rhinorrhea. Negative for ear pain, sneezing and sore throat.   Eyes: Negative for photophobia and visual disturbance.  Respiratory: Positive for cough. Negative for chest tightness, shortness of breath and wheezing.   Cardiovascular: Negative for chest pain and palpitations.  Gastrointestinal: Positive for nausea and vomiting. Negative for abdominal pain, blood in stool, constipation and diarrhea.  Genitourinary: Negative for dysuria, hematuria and urgency.  Musculoskeletal: Positive for myalgias.  Skin: Negative for rash.  Neurological: Negative for dizziness, weakness and light-headedness.     Physical Exam Updated Vital Signs BP 116/61 (BP Location: Right Arm)   Pulse 92   Temp 99.1 F (37.3 C) (Oral)   Resp 20   Ht 5\' 4"  (1.626 m)   Wt 122.5 kg   LMP 10/10/2018   SpO2 100%   BMI 46.35 kg/m   Physical Exam Vitals signs and nursing note reviewed.  Constitutional:      General: She is not in acute distress.    Appearance: She is well-developed.  HENT:     Head: Normocephalic and atraumatic.     Nose: Rhinorrhea present.  Eyes:     General: No scleral icterus.       Right eye: No discharge.        Left eye: No discharge.     Conjunctiva/sclera: Conjunctivae normal.  Neck:     Musculoskeletal: Normal range of motion and neck supple.  Cardiovascular:     Rate and Rhythm: Regular rhythm. Tachycardia present.     Heart sounds: Normal heart sounds. No murmur. No friction rub. No gallop.   Pulmonary:     Effort: Pulmonary effort is normal. No respiratory distress.     Breath sounds: Normal breath sounds.   Abdominal:     General: Bowel sounds are normal. There is no distension.     Palpations: Abdomen is soft.     Tenderness: There is no abdominal tenderness. There is no guarding.     Comments: Gravid uterus.  Musculoskeletal: Normal range of motion.  Skin:    General: Skin is warm and dry.     Findings: No rash.  Neurological:     Mental Status: She is alert.     Motor: No abnormal muscle tone.  Coordination: Coordination normal.      ED Treatments / Results  Labs (all labs ordered are listed, but only abnormal results are displayed) Labs Reviewed  COMPREHENSIVE METABOLIC PANEL - Abnormal; Notable for the following components:      Result Value   Potassium 3.2 (*)    BUN 5 (*)    Total Protein 8.7 (*)    All other components within normal limits  CBC WITH DIFFERENTIAL/PLATELET - Abnormal; Notable for the following components:   WBC 11.3 (*)    Neutro Abs 8.8 (*)    All other components within normal limits  URINALYSIS, ROUTINE W REFLEX MICROSCOPIC - Abnormal; Notable for the following components:   APPearance HAZY (*)    Ketones, ur 80 (*)    Protein, ur 30 (*)    All other components within normal limits  URINE CULTURE    EKG None  Radiology No results found.  Procedures Procedures (including critical care time)  Medications Ordered in ED Medications  sodium chloride 0.9 % bolus 1,000 mL (1,000 mLs Intravenous New Bag/Given 01/21/19 1121)  acetaminophen (TYLENOL) tablet 650 mg (650 mg Oral Given 01/21/19 1248)  potassium chloride SA (K-DUR,KLOR-CON) CR tablet 40 mEq (40 mEq Oral Given 01/21/19 1248)     Initial Impression / Assessment and Plan / ED Course  I have reviewed the triage vital signs and the nursing notes.  Pertinent labs & imaging results that were available during my care of the patient were reviewed by me and considered in my medical decision making (see chart for details).   28 year old female presents to ED for generalized body aches,  nausea, vomiting, rhinorrhea and cough.  Symptoms have been going on for 1 week and she was diagnosed with influenza-like illness last week.  She is not been able to fill her prescription for Tamiflu secondary to cost.  Sick contacts including her significant other with similar symptoms.  She reports feeling dehydrated.  On my exam abdomen is soft, nontender.  She has no chest pain or shortness of breath.  Tachycardia could be secondary to her dehydration.  Will give IV fluids, check lab work and reassess.  Clinical Course as of Jan 21 1305  Sat Jan 21, 2019  1215 Will replete orally.  Potassium(!): 3.2 [HK]  1215 Bacteria, UA: NONE SEEN [HK]  1215 Leukocytes,Ua: NEGATIVE [HK]  1215 Nitrite: NEGATIVE [HK]    Clinical Course User Index [HK] Gearldene Fiorenza, PA-C   After fluids, oral potassium and Tylenol patient's heart rate improved to 92. UA shows ketones and protein and I suspect symptoms are due to dehydration in the setting of viral illness/influenza like illness. She is out of the window for Tamiflu administration at this time.  She is now resting comfortably with medications given.  She is requesting discharge home.  We will have her take diclegis as needed for nausea and vomiting and Tylenol as needed for body aches.  Will advise her to return to ED for any severe worsening symptoms.  Patient is hemodynamically stable, in NAD, and able to ambulate in the ED. Evaluation does not show pathology that would require ongoing emergent intervention or inpatient treatment. I explained the diagnosis to the patient. Pain has been managed and has no complaints prior to discharge. Patient is comfortable with above plan and is stable for discharge at this time. All questions were answered prior to disposition. Strict return precautions for returning to the ED were discussed. Encouraged follow up with PCP.    Portions  of this note were generated with Scientist, clinical (histocompatibility and immunogenetics). Dictation errors may occur  despite best attempts at proofreading.   Final Clinical Impressions(s) / ED Diagnoses   Final diagnoses:  Dehydration  Influenza-like illness  Nausea and vomiting in pregnancy    ED Discharge Orders         Ordered    pyridOXINE (VITAMIN B-6) 50 MG tablet  Daily     01/21/19 1258    doxylamine, Sleep, (UNISOM) 25 MG tablet  At bedtime PRN     01/21/19 1258           Dietrich Pates, PA-C 01/21/19 1306    Gwyneth Sprout, MD 01/21/19 [phone]

## 2019-01-21 NOTE — ED Triage Notes (Signed)
Patient states she was diagnosed with the flu last week.  Patient states she tried to go back to work today, but is having generalized body aches and feeling dizzy.Marland Kitchen

## 2019-01-22 ENCOUNTER — Inpatient Hospital Stay (HOSPITAL_COMMUNITY)
Admission: AD | Admit: 2019-01-22 | Discharge: 2019-01-22 | Disposition: A | Discharge status: Home / Self Care | Payer: Commercial Managed Care - PPO | Source: Ambulatory Visit | Attending: Obstetrics & Gynecology | Admitting: Obstetrics & Gynecology

## 2019-01-22 DIAGNOSIS — O99511 Diseases of the respiratory system complicating pregnancy, first trimester: Secondary | ICD-10-CM | POA: Diagnosis not present

## 2019-01-22 DIAGNOSIS — O26891 Other specified pregnancy related conditions, first trimester: Secondary | ICD-10-CM | POA: Diagnosis present

## 2019-01-22 DIAGNOSIS — O219 Vomiting of pregnancy, unspecified: Secondary | ICD-10-CM | POA: Diagnosis not present

## 2019-01-22 DIAGNOSIS — Z3A13 13 weeks gestation of pregnancy: Secondary | ICD-10-CM

## 2019-01-22 DIAGNOSIS — J111 Influenza due to unidentified influenza virus with other respiratory manifestations: Secondary | ICD-10-CM | POA: Insufficient documentation

## 2019-01-22 DIAGNOSIS — O210 Mild hyperemesis gravidarum: Secondary | ICD-10-CM | POA: Diagnosis not present

## 2019-01-22 DIAGNOSIS — O10919 Unspecified pre-existing hypertension complicating pregnancy, unspecified trimester: Secondary | ICD-10-CM

## 2019-01-22 DIAGNOSIS — Z87891 Personal history of nicotine dependence: Secondary | ICD-10-CM | POA: Insufficient documentation

## 2019-01-22 DIAGNOSIS — Z3492 Encounter for supervision of normal pregnancy, unspecified, second trimester: Secondary | ICD-10-CM

## 2019-01-22 MED ORDER — PROMETHAZINE HCL 25 MG PO TABS: 25.0000 mg | ORAL_TABLET | Freq: Four times a day (QID) | ORAL | 2 refills | Status: DC | PRN

## 2019-01-22 MED ORDER — LABETALOL HCL 100 MG PO TABS: 100.0000 mg | ORAL_TABLET | Freq: Two times a day (BID) | ORAL | 5 refills | Status: DC

## 2019-01-22 NOTE — MAU Note (Signed)
Tanya Moreno is a 28 y.o. at [redacted]w[redacted]d here in MAU reporting: pt was diagnosed with the flu last Sunday, did not pick up tamiflu due to cost. States she wants to check on the baby today. No pain, vaginal bleeding, no discharge  Onset of complaint: ongoing  Pain score: 0/10  Vitals:   01/22/19 1754  BP: 123/73  Pulse: (!) 102  Resp: 18  Temp: 98.8 F (37.1 C)  SpO2: 99%      FHT:166  Lab orders placed from triage: none

## 2019-01-22 NOTE — MAU Provider Note (Signed)
Chief Complaint: Decreased Fetal Movement   First Provider Initiated Contact with Patient 01/22/19 1812     SUBJECTIVE HPI: Tanya Moreno is a 28 y.o. [phone] at [redacted]w[redacted]d who presents to Maternity Admissions wanting to have FHR checked because she was Dx w/ influenza-like illness 01/15/19 and went to ED 01/21/19 for ongoing Sx and N/V of pregnancy, but was concerned because they didn't check the baby's FHR. States ILI is improving. Still vomiting 1-2 x per day. Hasn't filled Phenergan Rx, PNV, Labetalol and didn't fill Tamiflu Rx due to cost. States she doesn't know how much each one cost.    Associated signs and symptoms: Pos congestion, rhinorrhea. Neg fever, chills, cough.   Past Medical History:  Diagnosis Date  . Chlamydia   . Genital HSV    Last outbreak Nov 2013  . Gonorrhea   . Trichomonas    OB History  Gravida Para Term Preterm AB Living  4 3 3     3   SAB TAB Ectopic Multiple Live Births        0 3    # Outcome Date GA Lbr Len/2nd Weight Sex Delivery Anes PTL Lv  4 Current           3 Term 10/17/17 [redacted]w[redacted]d 03:33 / 00:01 3935 g M Vag-Spont None  LIV     Birth Comments: wnl  2 Term 04/12/13 [redacted]w[redacted]d 16:45 / 00:09 3045 g F Vag-Spont None  LIV  1 Term 11/01/11 [redacted]w[redacted]d 16:25 / 00:04 3116 g M Vag-Spont None  LIV     Birth Comments: none   Past Surgical History:  Procedure Laterality Date  . NO PAST SURGERIES     Social History   Socioeconomic History  . Marital status: Single    Spouse name: Not on file  . Number of children: Not on file  . Years of education: Not on file  . Highest education level: Not on file  Occupational History  . Not on file  Social Needs  . Financial resource strain: Not on file  . Food insecurity:    Worry: Not on file    Inability: Not on file  . Transportation needs:    Medical: Not on file    Non-medical: Not on file  Tobacco Use  . Smoking status: Former Smoker    Last attempt to quit: 2015    Years since quitting: 5.1  . Smokeless  tobacco: Former Neurosurgeon    Quit date: 11/10/2014  Substance and Sexual Activity  . Alcohol use: No    Alcohol/week: 0.0 standard drinks  . Drug use: No  . Sexual activity: Yes    Partners: Male    Birth control/protection: None  Lifestyle  . Physical activity:    Days per week: Not on file    Minutes per session: Not on file  . Stress: Not on file  Relationships  . Social connections:    Talks on phone: Not on file    Gets together: Not on file    Attends religious service: Not on file    Active member of club or organization: Not on file    Attends meetings of clubs or organizations: Not on file    Relationship status: Not on file  . Intimate partner violence:    Fear of current or ex partner: Not on file    Emotionally abused: Not on file    Physically abused: Not on file    Forced sexual activity: Not on file  Other  Topics Concern  . Not on file  Social History Narrative   Lives with godmother.    Family History  Problem Relation Age of Onset  . Heart disease Father   . Kidney disease Maternal Aunt    No current facility-administered medications on file prior to encounter.    Current Outpatient Medications on File Prior to Encounter  Medication Sig Dispense Refill  . aspirin EC 81 MG tablet Take 1 tablet (81 mg total) by mouth daily. Take after 12 weeks for prevention of preeclampssia later in pregnancy (Patient not taking: Reported on 01/21/2019) 300 tablet 2  . doxylamine, Sleep, (UNISOM) 25 MG tablet Take 1 tablet (25 mg total) by mouth at bedtime as needed. 30 tablet 0  . fluticasone (FLONASE) 50 MCG/ACT nasal spray Place 2 sprays into both nostrils daily. (Patient not taking: Reported on 01/21/2019) 1 g 0  . ipratropium (ATROVENT) 0.06 % nasal spray Place 2 sprays into both nostrils 4 (four) times daily. (Patient not taking: Reported on 01/21/2019) 15 mL 0  . oseltamivir (TAMIFLU) 75 MG capsule Take 1 capsule (75 mg total) by mouth every 12 (twelve) hours. (Patient not  taking: Reported on 01/21/2019) 10 capsule 0  . Prenatal Vit-Fe Phos-FA-Omega (VITAFOL GUMMIES) 3.33-0.333-34.8 MG CHEW Chew 1 tablet by mouth daily. (Patient not taking: Reported on 01/21/2019) 90 tablet 3  . pyridOXINE (VITAMIN B-6) 50 MG tablet Take 1 tablet (50 mg total) by mouth daily. 30 tablet 0   Allergies  Allergen Reactions  . Latex Swelling    Mainly latex condoms  . Tape Itching    I have reviewed patient's Past Medical Hx, Surgical Hx, Family Hx, Social Hx, medications and allergies.   Review of Systems  Constitutional: Negative for chills and fever.  HENT: Positive for congestion and rhinorrhea.   Respiratory: Negative for cough.   Gastrointestinal: Negative for abdominal pain.  Genitourinary: Negative for vaginal bleeding.    OBJECTIVE Patient Vitals for the past 24 hrs:  BP Temp Temp src Pulse Resp SpO2 Height Weight  01/22/19 1754 123/73 98.8 F (37.1 C) Oral (!) 102 18 99 % - -  01/22/19 1747 - - - - - - 5\' 4"  (1.626 m) 118.8 kg   Constitutional: Well-developed, well-nourished female in no acute distress.  HEENT: + congestion Cardiovascular: Mild tachycardia Respiratory: normal rate and effort.  Neurologic: Alert and oriented x 4.  GU: FHR 166 by doppler  LAB RESULTS No results found for this or any previous visit (from the past 24 hour(s)).  MAU COURSE Orders Placed This Encounter  Procedures  . Discharge patient   Meds ordered this encounter  Medications  . promethazine (PHENERGAN) 25 MG tablet    Sig: Take 1 tablet (25 mg total) by mouth every 6 (six) hours as needed for nausea or vomiting.    Dispense:  30 tablet    Refill:  2    Order Specific Question:   Supervising Provider    Answer:   Jaynie CollinsANYANWU, UGONNA A [[id number]]  . labetalol (NORMODYNE) 100 MG tablet    Sig: Take 1 tablet (100 mg total) by mouth 2 (two) times daily.    Dispense:  60 tablet    Refill:  5    Order Specific Question:   Supervising Provider    Answer:   Jaynie CollinsANYANWU, UGONNA A  [[id number]]    MDM - Pos FHR by doppler.  - ILI improving  - N/V of pregnancy. Urged pt to fill phenergan Rx and discussed less expensive  locations to fill Rx.   - CHTN. Urged pt to fill Labetalol Rx or discuss cost issues w/ provider at [facility].   ASSESSMENT 1. Presence of fetal heart sounds in second trimester   2. Influenza   3. Morning sickness   4. Nausea/vomiting in pregnancy     PLAN Discharge home in stable condition. First trimester precautions Follow-up Information    [facility] Follow up on 01/26/2019.   Why:  as scheduled or sooner as need if symptoms worsen Contact information: [address] [place] [address] [phone]       [facility] Follow up.   Specialty:  Obstetrics and Gynecology Why:  as needed in pregnancy emergencies Contact information: [address] [phone] [place] [address] [phone]         Allergies as of 01/22/2019      Reactions   Latex Swelling   Mainly latex condoms   Tape Itching      Medication List    TAKE these medications   aspirin EC 81 MG tablet Take 1 tablet (81 mg total) by mouth daily. Take after 12 weeks for prevention of preeclampssia later in pregnancy   doxylamine (Sleep) 25 MG tablet Commonly known as:  UNISOM Take 1 tablet (25 mg total) by mouth at bedtime as needed.   fluticasone 50 MCG/ACT nasal spray Commonly known as:  FLONASE Place 2 sprays into both nostrils daily.   ipratropium 0.06 % nasal spray Commonly known as:  ATROVENT Place 2 sprays into both nostrils 4 (four) times daily.   labetalol 100 MG tablet Commonly known as:  NORMODYNE Take 1 tablet (100 mg total) by mouth 2 (two) times daily.   oseltamivir 75 MG capsule Commonly known as:  TAMIFLU Take 1 capsule (75 mg total) by mouth every 12 (twelve) hours.   promethazine 25 MG tablet Commonly known as:  PHENERGAN Take 1 tablet (25 mg total)  by mouth every 6 (six) hours as needed for nausea or vomiting.   pyridOXINE 50 MG tablet Commonly known as:  VITAMIN B-6 Take 1 tablet (50 mg total) by mouth daily.   VITAFOL GUMMIES 3.33-0.333-34.8 MG Chew Chew 1 tablet by mouth daily.        Katrinka Blazing, [place], [place] 01/22/2019  6:13 PM

## 2019-01-22 NOTE — Discharge Instructions (Signed)
Pregnancy and Influenza    Influenza, also called the flu, is an infection of the lungs and airways (respiratory tract). If you are pregnant, you are more likely to catch the flu. You are also more likely to have a more serious case of the flu. This is because pregnancy causes changes to your body's disease-fighting system (immune system), heart, and lungs. If you develop a bad case of the flu, especially with a high fever, this can cause problems for you and your developing baby.  How do people get the flu?  The flu is caused by a type of germ called a virus. It spreads when virus particles get passed from person to person by:   Being near a sick person who is coughing or sneezing.   Touching something that has the virus on it and then touching your mouth, nose, or face.  The influenza virus is most common during the fall and winter.  How can I protect myself against the flu?   Get a flu shot. The best way to prevent the flu is to get a flu shot before flu season starts. The flu shot is not dangerous for your developing baby. It may even help protect your baby from the flu for up to 6 months after birth.   Wash your hands often with soap and warm water. If soap and water are not available, use hand sanitizer.   Do not come in close contact with sick people.   Do not share food, drinks, or utensils with other people.   Avoid touching your eyes, nose, and mouth.   Clean frequently used surfaces at home, school, or work.   Practice healthy lifestyle habits, such as:  ? Eating a healthy, balanced diet.  ? Drinking plenty of fluids.  ? Exercising regularly or as told by your health care provider.  ? Sleeping 7-9 hours each night.  ? Finding ways to manage stress.  What should I do if I have flu symptoms?   If you have any symptoms of the flu, even after getting a flu shot, contact your health care provider right away.   To reduce fever, take over-the-counter acetaminophen as told by your health care  provider.   If you have the flu, you may get antiviral medicine to keep the flu from becoming severe and to shorten how long it lasts.   Avoid spreading the flu to others:  ? Stay home until you are well.  ? Cover your nose and mouth when you cough or sneeze.  ? Wash your hands often.  Follow these instructions at home:   Take over-the-counter and prescription medicines only as told by your health care provider. Do not take any medicine, including cold or flu medicine, unless your health care provider tells you to do so.   If you were prescribed antiviral medicine, take it as told by your health care provider. Do not stop taking the antiviral medicine even if you start to feel better.   Eat a nutrient-rich diet that includes fresh fruits and vegetables, whole grains, lean protein, and low-fat dairy.   Drink enough fluid to keep your urine clear or pale yellow.   Get plenty of rest.  Contact a health care provider if:   You have fever or chills.   You have a cough, sore throat, or stuffy nose.   You have worsening or unusual:  ? Muscle aches.  ? Headache.  ? Tiredness.  ? Loss of appetite.   You   have vomiting or diarrhea.  Get help right away if:   You have trouble breathing.   You have chest pain.   You have abdominal pain.   You begin to have labor pains.   You have a fever that does not go down 24 hours after you take medicine.   You do not feel your baby move.   You have diarrhea or vomiting that will not go away.   You have dizziness or confusion.   Your symptoms do not improve, even with treatment.  Summary   If you are pregnant, you are more likely to catch the flu. You are also more likely to have a more serious case of the flu.   If you have flu-like symptoms, call your health care provider right away. If you develop a bad case of the flu, especially with a high fever, this can be dangerous for your developing baby.   The best way to prevent the flu is to get a flu shot before flu  season starts. The flu shot is not dangerous for your developing baby.   If you have the flu and were prescribed antiviral medicine, take it as told by your health care provider.  This information is not intended to replace advice given to you by your health care provider. Make sure you discuss any questions you have with your health care provider.  Document Released: 09/25/2008 Document Revised: 01/19/2017 Document Reviewed: 01/19/2017  Elsevier Interactive Patient Education  2019 [employer]vier Inc[employer]

## 2019-01-23 LAB — URINE CULTURE: Culture: <10000 — AB

## 2019-01-26 ENCOUNTER — Ambulatory Visit (INDEPENDENT_AMBULATORY_CARE_PROVIDER_SITE_OTHER): Payer: Commercial Managed Care - PPO | Admitting: Certified Nurse Midwife

## 2019-01-26 VITALS — BP 120/82 | HR 104 | Wt 266.4 lb

## 2019-01-26 DIAGNOSIS — Z3481 Encounter for supervision of other normal pregnancy, first trimester: Secondary | ICD-10-CM

## 2019-01-26 DIAGNOSIS — O99210 Obesity complicating pregnancy, unspecified trimester: Secondary | ICD-10-CM

## 2019-01-26 DIAGNOSIS — O99211 Obesity complicating pregnancy, first trimester: Secondary | ICD-10-CM

## 2019-01-26 DIAGNOSIS — Z3A13 13 weeks gestation of pregnancy: Secondary | ICD-10-CM

## 2019-01-26 DIAGNOSIS — Z8759 Personal history of other complications of pregnancy, childbirth and the puerperium: Secondary | ICD-10-CM

## 2019-01-26 DIAGNOSIS — Z3480 Encounter for supervision of other normal pregnancy, unspecified trimester: Secondary | ICD-10-CM

## 2019-01-26 MED ORDER — ASPIRIN EC 81 MG PO TBEC: 81.0000 mg | DELAYED_RELEASE_TABLET | Freq: Every day | ORAL | 0 refills | Status: DC

## 2019-01-26 NOTE — Progress Notes (Signed)
Patient is in the office for ROB, denies pain.

## 2019-01-27 NOTE — Progress Notes (Signed)
   PRENATAL VISIT NOTE  Subjective:  Tanya Moreno is a 28 y.o. [phone] at [redacted]w[redacted]d being seen today for ongoing prenatal care.  She is currently monitored for the following issues for this low-risk pregnancy and has Latex allergy; Chlamydia infection affecting pregnancy in first trimester; History of HSV; BMI 40.0-44.9, adult (HCC); Obesity in pregnancy, antepartum; Carpal tunnel syndrome during pregnancy; History of gestational hypertension; Supervision of normal pregnancy, antepartum; Abnormal Papanicolaou smear of cervix with positive human papilloma virus (HPV) test; and Chronic hypertension affecting pregnancy on their problem list.  Patient reports no complaints.  Contractions: Not present. Vag. Bleeding: None.   . Denies leaking of fluid.   The following portions of the patient's history were reviewed and updated as appropriate: allergies, current medications, past family history, past medical history, past social history, past surgical history and problem list. Problem list updated.  Objective:  Vitals:   01/26/19 1515  BP: 120/82  Pulse: (!) 104  Weight: 266 lb 6.4 oz (120.8 kg)    Fetal Status: Fetal Heart Rate (bpm): 156         General:  Alert, oriented and cooperative. Patient is in no acute distress.  Skin: Skin is warm and dry. No rash noted.   Cardiovascular: Normal heart rate noted  Respiratory: Normal respiratory effort, no problems with respiration noted  Abdomen: Soft, gravid, appropriate for gestational age.  Pain/Pressure: Absent     Pelvic: Cervical exam deferred        Extremities: Normal range of motion.  Edema: None  Mental Status: Normal mood and affect. Normal behavior. Normal judgment and thought content.   Assessment and Plan:  Pregnancy: G4P3003 at [redacted]w[redacted]d  1. Supervision of other normal pregnancy, antepartum - Patient doing well, no complaints - Anticipatory guidance on upcoming appointments - Routine prenatal care  - [place] MFM OB COMP + 14 WK; Future -  aspirin EC 81 MG tablet; Take 1 tablet (81 mg total) by mouth daily. Take after 12 weeks for prevention of preeclampssia later in pregnancy  Dispense: 300 tablet; Refill: 0  2. Obesity in pregnancy, antepartum - Discussed recommendations of weight gain during pregnancy and to limit amount of carbs/sweets during pregnancy  - [place] MFM OB COMP + 14 WK; Future  3. History of gestational hypertension - BP stable at this time  - Educated on use of BASA to prevent preeclampsia later in pregnancy  - Rx sent to pharmacy of choice  - aspirin EC 81 MG tablet; Take 1 tablet (81 mg total) by mouth daily. Take after 12 weeks for prevention of preeclampssia later in pregnancy  Dispense: 300 tablet; Refill: 0  Preterm labor symptoms and general obstetric precautions including but not limited to vaginal bleeding, contractions, leaking of fluid and fetal movement were reviewed in detail with the patient. Please refer to After Visit Summary for other counseling recommendations.  Return in about 4 weeks (around 02/23/2019) for ROB.  Future Appointments  Date Time Provider Department Center  02/23/2019  2:45 PM Sharyon Cable, CNM [facility] None  03/02/2019  1:45 PM [facility] [place] 2 [facility]    Sharyon Cable, CNM

## 2019-02-21 ENCOUNTER — Encounter (HOSPITAL_COMMUNITY): Payer: Self-pay

## 2019-02-23 ENCOUNTER — Encounter: Payer: Self-pay | Admitting: Certified Nurse Midwife

## 2019-02-23 ENCOUNTER — Other Ambulatory Visit: Payer: Self-pay

## 2019-02-23 ENCOUNTER — Ambulatory Visit (INDEPENDENT_AMBULATORY_CARE_PROVIDER_SITE_OTHER): Payer: Commercial Managed Care - PPO | Admitting: Certified Nurse Midwife

## 2019-02-23 VITALS — BP 129/77 | HR 93 | Wt 272.0 lb

## 2019-02-23 DIAGNOSIS — Z3480 Encounter for supervision of other normal pregnancy, unspecified trimester: Secondary | ICD-10-CM

## 2019-02-23 DIAGNOSIS — O10919 Unspecified pre-existing hypertension complicating pregnancy, unspecified trimester: Secondary | ICD-10-CM

## 2019-02-23 DIAGNOSIS — Z3A17 17 weeks gestation of pregnancy: Secondary | ICD-10-CM

## 2019-02-23 DIAGNOSIS — O10912 Unspecified pre-existing hypertension complicating pregnancy, second trimester: Secondary | ICD-10-CM

## 2019-02-23 DIAGNOSIS — O99212 Obesity complicating pregnancy, second trimester: Secondary | ICD-10-CM

## 2019-02-23 DIAGNOSIS — O99210 Obesity complicating pregnancy, unspecified trimester: Secondary | ICD-10-CM

## 2019-02-23 NOTE — Progress Notes (Signed)
   PRENATAL VISIT NOTE  Subjective:  Tanya Moreno is a 28 y.o. [phone] at [redacted]w[redacted]d being seen today for ongoing prenatal care.  She is currently monitored for the following issues for this high-risk pregnancy and has Latex allergy; Chlamydia infection affecting pregnancy in first trimester; History of HSV; BMI 40.0-44.9, adult (HCC); Obesity in pregnancy, antepartum; Carpal tunnel syndrome during pregnancy; Supervision of normal pregnancy, antepartum; Abnormal Papanicolaou smear of cervix with positive human papilloma virus (HPV) test; and Chronic hypertension affecting pregnancy on their problem list.  Patient reports no complaints.  Contractions: Not present. Vag. Bleeding: None.  Movement: Present. Denies leaking of fluid.   The following portions of the patient's history were reviewed and updated as appropriate: allergies, current medications, past family history, past medical history, past social history, past surgical history and problem list.   Objective:  Vitals:   02/23/19 1446  BP: 129/77  Pulse: 93  Weight: 272 lb (123.4 kg)    Fetal Status: Fetal Heart Rate (bpm): 145   Movement: Present     General:  Alert, oriented and cooperative. Patient is in no acute distress.  Skin: Skin is warm and dry. No rash noted.   Cardiovascular: Normal heart rate noted  Respiratory: Normal respiratory effort, no problems with respiration noted  Abdomen: Soft, gravid, appropriate for gestational age.  Pain/Pressure: Absent     Pelvic: Cervical exam deferred        Extremities: Normal range of motion.     Mental Status: Normal mood and affect. Normal behavior. Normal judgment and thought content.   Assessment and Plan:  Pregnancy: G4P3003 at [redacted]w[redacted]d 1. Supervision of other normal pregnancy, antepartum - Patient doing well, no complaints - Anticipatory guidance on upcoming appointments   2. Obesity in pregnancy, antepartum - Continue BASA  - Discussed recommendation of weight gain and  exercise during pregnancy   3. Chronic hypertension affecting pregnancy - Currently not on any medication  - BP stable at this time  - Chronic HTN diagnosed based on elevated BP in MAU during early pregnancy and prior to getting pregnant   Preterm labor symptoms and general obstetric precautions including but not limited to vaginal bleeding, contractions, leaking of fluid and fetal movement were reviewed in detail with the patient. Please refer to After Visit Summary for other counseling recommendations.   Return in about 4 weeks (around 03/23/2019) for ROB.  Future Appointments  Date Time Provider Department Center  03/02/2019  1:45 PM [facility] [place] [facility]  03/22/2019  3:15 PM Constant, Gigi Gin, MD [facility] None    Sharyon Cable, CNM

## 2019-02-23 NOTE — Patient Instructions (Signed)

## 2019-02-28 IMAGING — US US OB COMP LESS 14 WK
1 series · 15 of 28 positions shown · non-contrast
Comparison: None.

CLINICAL DATA: Lower pelvic pain for 2 days

EXAM:
OBSTETRIC <14 WK US AND TRANSVAGINAL OB US
TECHNIQUE: Both transabdominal and transvaginal ultrasound examinations were
performed for complete evaluation of the gestation as well as the
maternal uterus, adnexal regions, and pelvic cul-de-sac.
Transvaginal technique was performed to assess early pregnancy.

[Series 1: us ob comp less 14 wk · 15 of 66 slices shown]
[im 1/66]
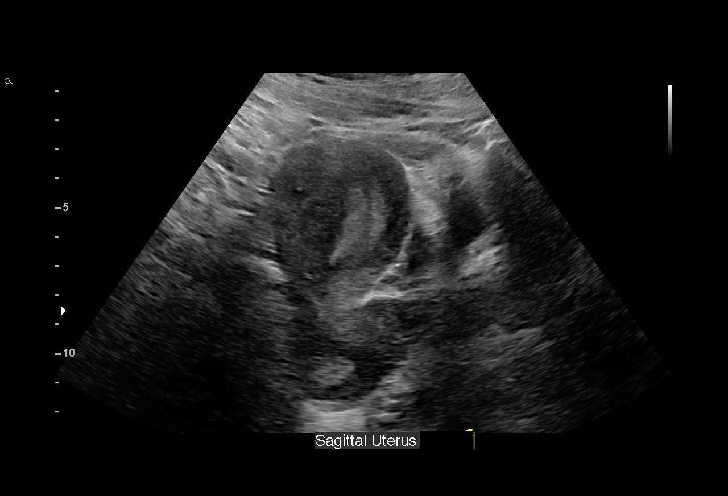
[im 5/66]
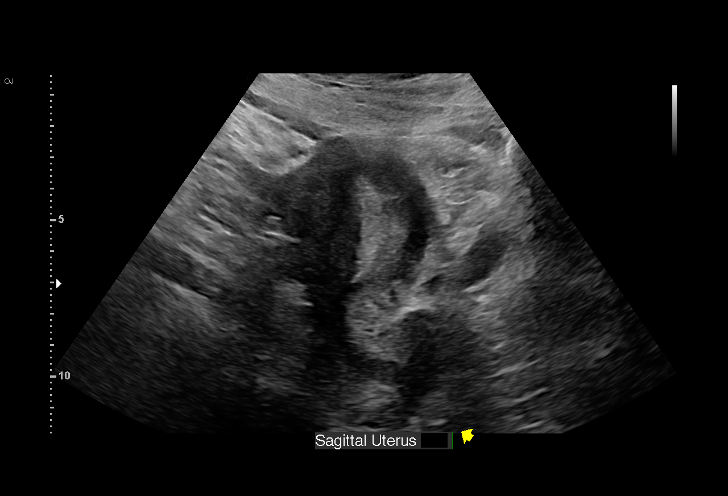
[im 10/66]
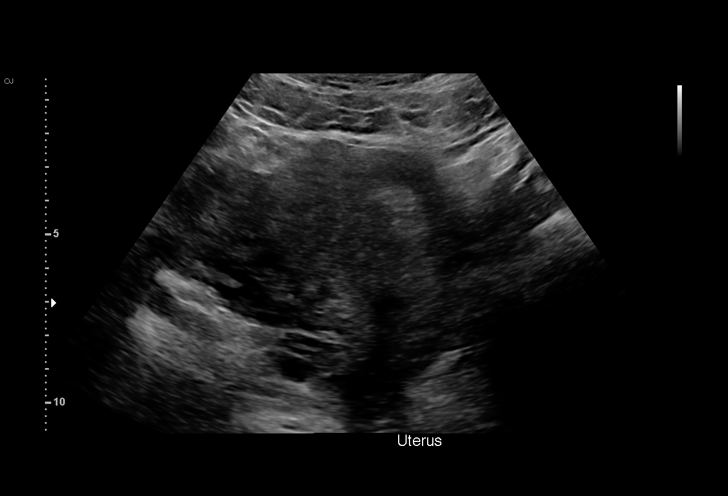
[im 15/66]
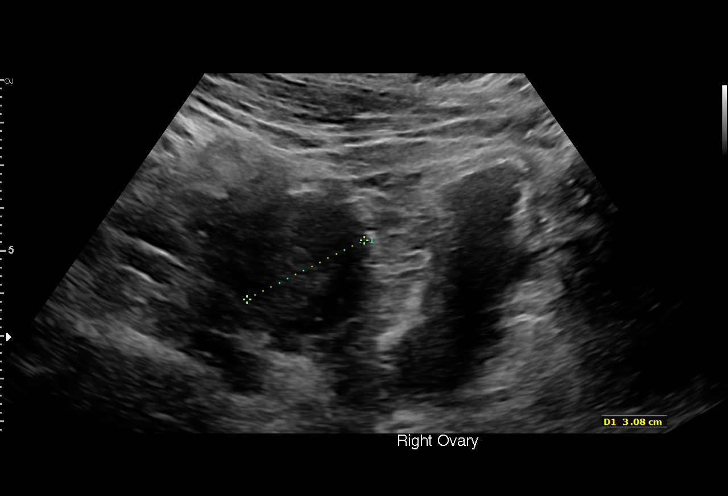
[im 20/66]
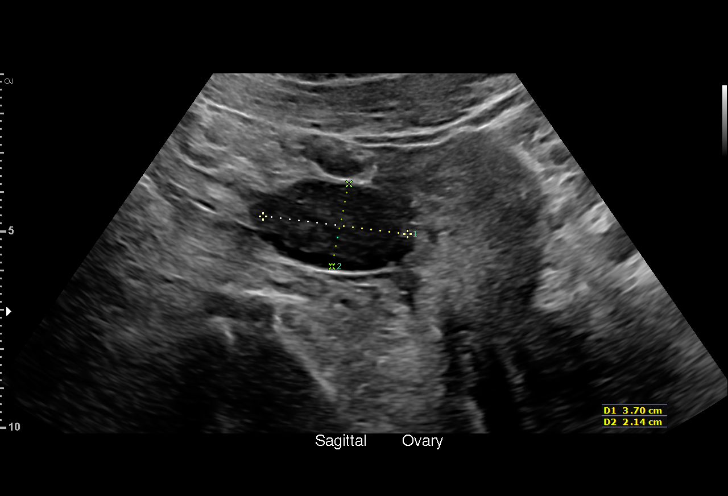
[im 25/66]
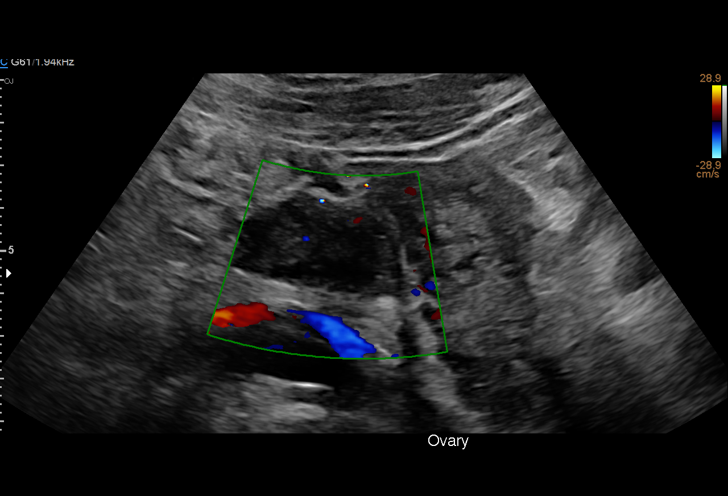
[im 29/66]
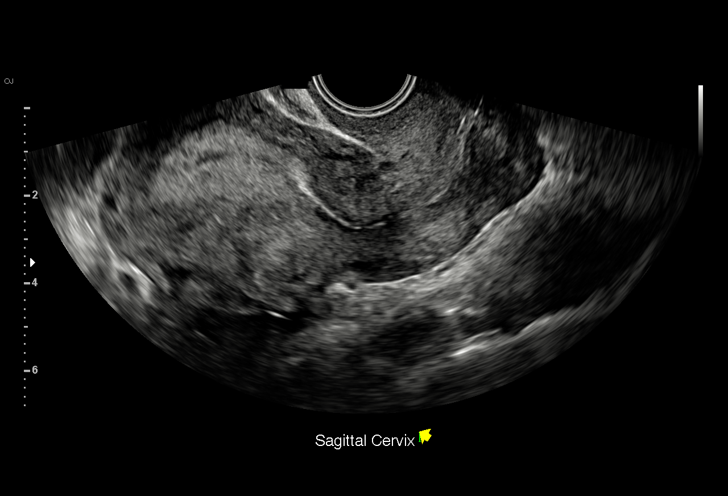
[im 34/66]
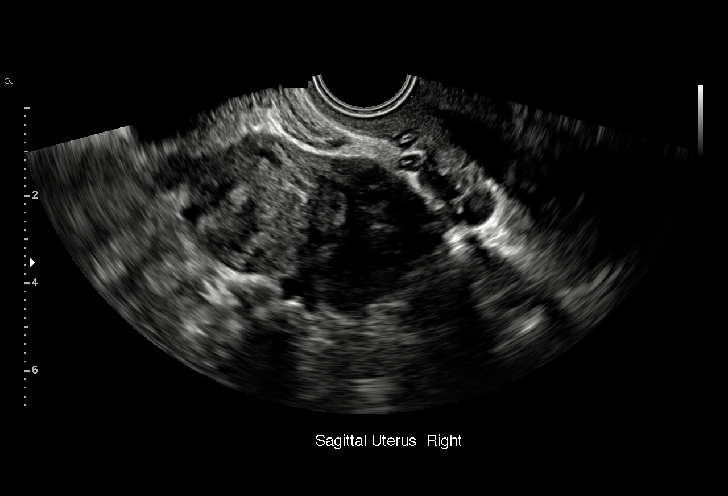
[im 37/66]
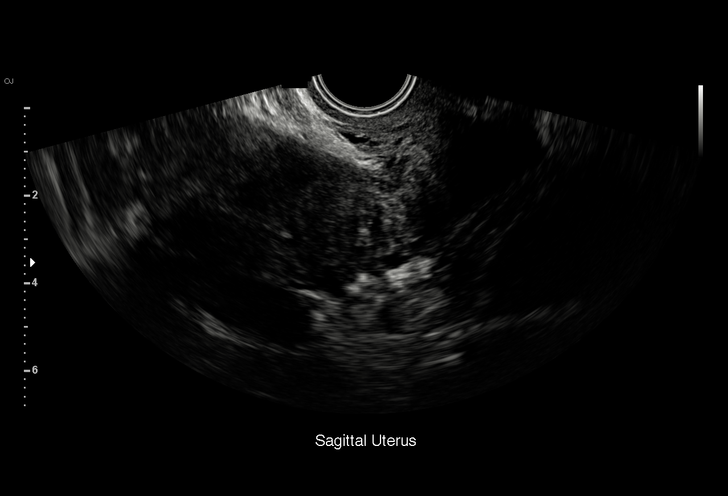
[im 41/66]
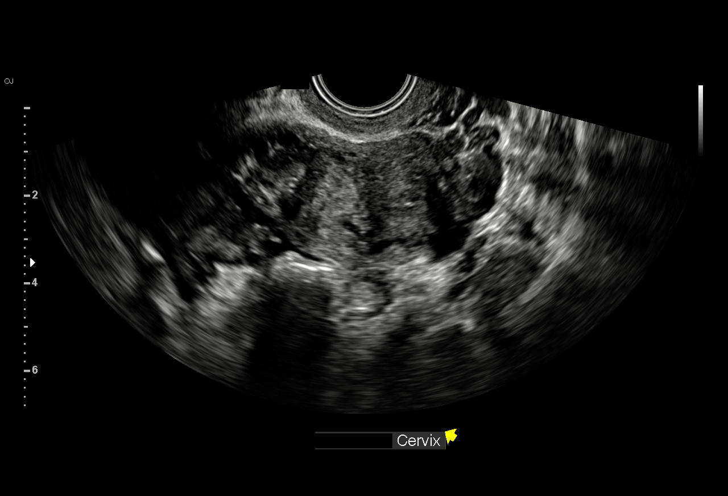
[im 46/66]
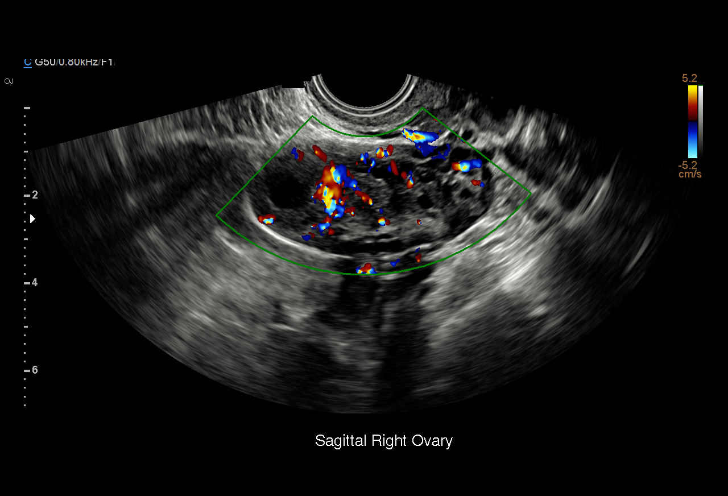
[im 51/66]
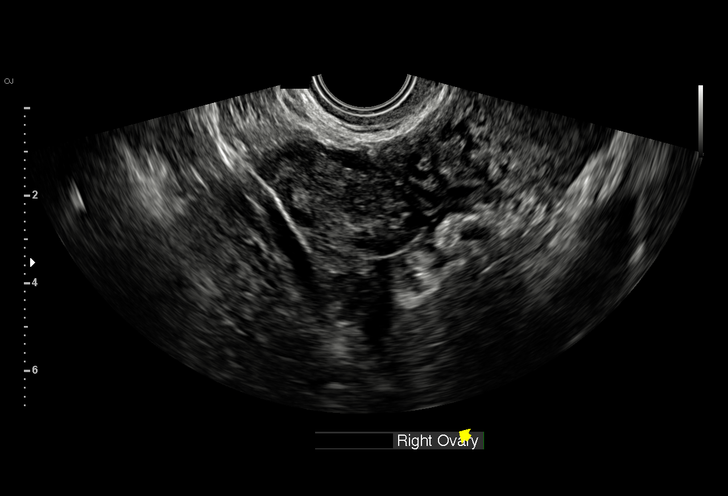
[im 56/66]
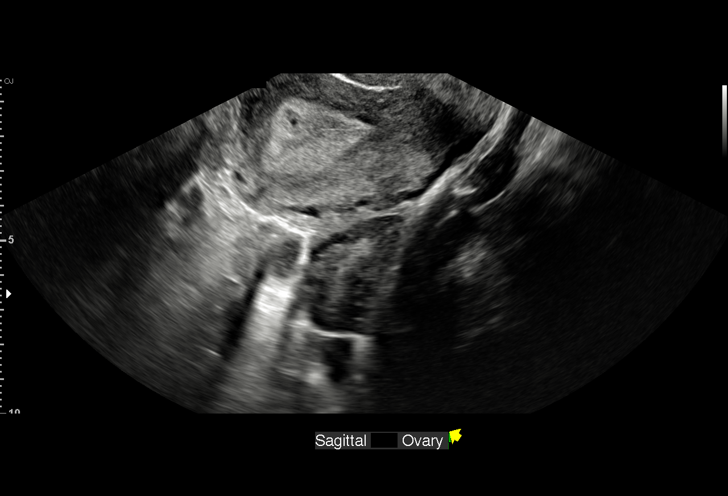
[im 61/66]
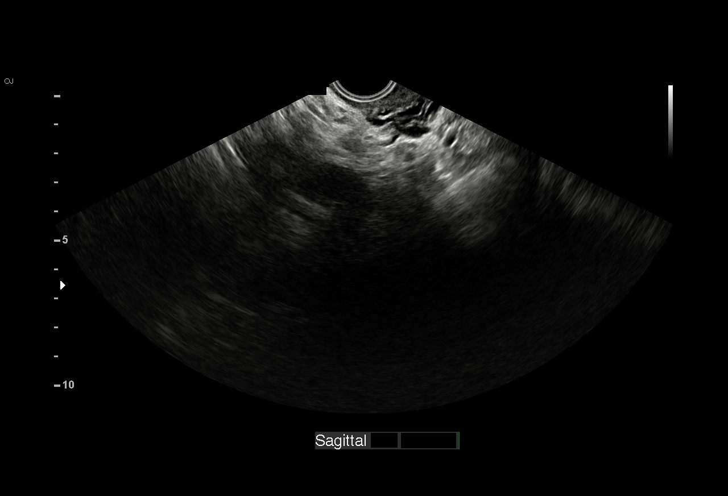
[im 66/66]
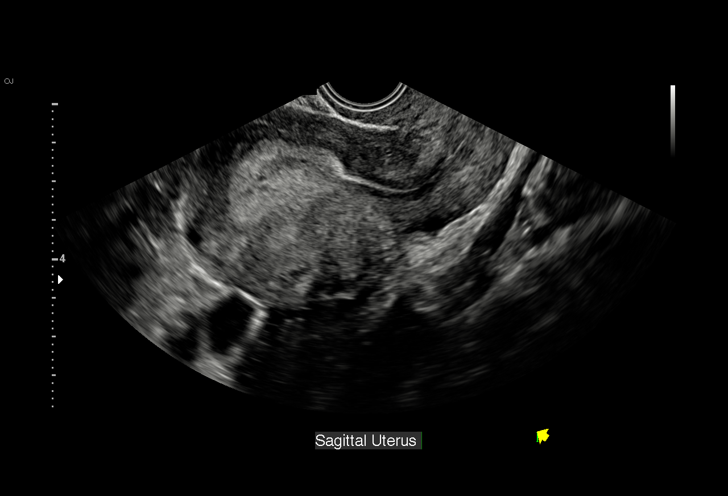

[15 of 28 positions shown; findings below may reference images not displayed]

FINDINGS: Intrauterine gestational sac: Single

Yolk sac:  Not visualize

Embryo:  Not visualized

Cardiac Activity: N/A

Heart Rate:   bpm

MSD: 2  mm   4 w   6  d

CRL:    mm    w    d                  US EDC:

Subchorionic hemorrhage:  None visualized.

Maternal uterus/adnexae: No adnexal mass or free fluid.
IMPRESSION: Early intrauterine pregnancy, 4 weeks 6 days by mean sac diameter.
No yolk sac or fetal pole currently. No acute maternal findings.

## 2019-03-02 ENCOUNTER — Other Ambulatory Visit: Payer: Self-pay | Admitting: Certified Nurse Midwife

## 2019-03-02 ENCOUNTER — Ambulatory Visit (HOSPITAL_COMMUNITY)
Admission: RE | Admit: 2019-03-02 | Discharge: 2019-03-02 | Disposition: A | Discharge status: Home / Self Care | Payer: Commercial Managed Care - PPO | Source: Ambulatory Visit | Attending: Maternal & Fetal Medicine | Admitting: Maternal & Fetal Medicine

## 2019-03-02 ENCOUNTER — Other Ambulatory Visit: Payer: Self-pay

## 2019-03-02 DIAGNOSIS — Z363 Encounter for antenatal screening for malformations: Secondary | ICD-10-CM

## 2019-03-02 DIAGNOSIS — Z3480 Encounter for supervision of other normal pregnancy, unspecified trimester: Secondary | ICD-10-CM | POA: Insufficient documentation

## 2019-03-02 DIAGNOSIS — O99210 Obesity complicating pregnancy, unspecified trimester: Secondary | ICD-10-CM | POA: Insufficient documentation

## 2019-03-02 DIAGNOSIS — O99212 Obesity complicating pregnancy, second trimester: Secondary | ICD-10-CM | POA: Diagnosis not present

## 2019-03-02 DIAGNOSIS — Z3A18 18 weeks gestation of pregnancy: Secondary | ICD-10-CM | POA: Diagnosis not present

## 2019-03-21 ENCOUNTER — Other Ambulatory Visit (HOSPITAL_COMMUNITY): Payer: Self-pay | Admitting: *Deleted

## 2019-03-21 DIAGNOSIS — Z362 Encounter for other antenatal screening follow-up: Secondary | ICD-10-CM

## 2019-03-22 ENCOUNTER — Ambulatory Visit (INDEPENDENT_AMBULATORY_CARE_PROVIDER_SITE_OTHER): Payer: Commercial Managed Care - PPO | Admitting: Obstetrics and Gynecology

## 2019-03-22 ENCOUNTER — Encounter: Payer: Self-pay | Admitting: Obstetrics and Gynecology

## 2019-03-22 ENCOUNTER — Other Ambulatory Visit: Payer: Self-pay

## 2019-03-22 VITALS — BP 114/74 | HR 92 | Wt 282.7 lb

## 2019-03-22 DIAGNOSIS — O99210 Obesity complicating pregnancy, unspecified trimester: Secondary | ICD-10-CM

## 2019-03-22 DIAGNOSIS — O99212 Obesity complicating pregnancy, second trimester: Secondary | ICD-10-CM

## 2019-03-22 DIAGNOSIS — O2641 Herpes gestationis, first trimester: Secondary | ICD-10-CM

## 2019-03-22 DIAGNOSIS — O10912 Unspecified pre-existing hypertension complicating pregnancy, second trimester: Secondary | ICD-10-CM

## 2019-03-22 DIAGNOSIS — Z6841 Body Mass Index (BMI) 40.0 and over, adult: Secondary | ICD-10-CM

## 2019-03-22 DIAGNOSIS — O98912 Unspecified maternal infectious and parasitic disease complicating pregnancy, second trimester: Secondary | ICD-10-CM

## 2019-03-22 DIAGNOSIS — Z3A21 21 weeks gestation of pregnancy: Secondary | ICD-10-CM

## 2019-03-22 DIAGNOSIS — Z3480 Encounter for supervision of other normal pregnancy, unspecified trimester: Secondary | ICD-10-CM

## 2019-03-22 DIAGNOSIS — O10919 Unspecified pre-existing hypertension complicating pregnancy, unspecified trimester: Secondary | ICD-10-CM

## 2019-03-22 NOTE — Progress Notes (Signed)
Pt is here for ROB. [redacted]w[redacted]d.

## 2019-03-22 NOTE — Progress Notes (Signed)
Prenatal Visit Note Date: 03/22/2019 Clinic: [facility]  Subjective:  Tanya Moreno is a 28 y.o. [phone] at [redacted]w[redacted]d being seen today for ongoing prenatal care.  She is currently monitored for the following issues for this high-risk pregnancy and has Latex allergy; History of HSV; BMI 40.0-44.9, adult (HCC); Obesity in pregnancy, antepartum; Carpal tunnel syndrome during pregnancy; Supervision of normal pregnancy, antepartum; Abnormal Papanicolaou smear of cervix with positive human papilloma virus (HPV) test; and Chronic hypertension affecting pregnancy on their problem list.  Patient reports no complaints.   Contractions: Not present. Vag. Bleeding: None.  Movement: Present. Denies leaking of fluid.   The following portions of the patient's history were reviewed and updated as appropriate: allergies, current medications, past family history, past medical history, past social history, past surgical history and problem list. Problem list updated.  Objective:   Vitals:   03/22/19 1533  BP: 114/74  Pulse: 92  Weight: 282 lb 11.2 oz (128.2 kg)    Fetal Status: Fetal Heart Rate (bpm): 146   Movement: Present     General:  Alert, oriented and cooperative. Patient is in no acute distress.  Skin: Skin is warm and dry. No rash noted.   Cardiovascular: Normal heart rate noted  Respiratory: Normal respiratory effort, no problems with respiration noted  Abdomen: Soft, gravid, appropriate for gestational age. Pain/Pressure: Absent     Pelvic:  Cervical exam deferred        Extremities: Normal range of motion.  Edema: None  Mental Status: Normal mood and affect. Normal behavior. Normal judgment and thought content.   Urinalysis:      Assessment and Plan:  Pregnancy: G4P3003 at [redacted]w[redacted]d  1. Supervision of other normal pregnancy, antepartum Routine care  2. Chronic hypertension affecting pregnancy On low dose asa. Told doesn't need labetalol so not taking. bp good. Continue to follow. Growth scan  next month already scheduled  3. History of HSV Start ppx 34-36wks  4. BMI 40.0-44.9, adult (HCC)  5. Obesity in pregnancy, antepartum Continue to follow  Preterm labor symptoms and general obstetric precautions including but not limited to vaginal bleeding, contractions, leaking of fluid and fetal movement were reviewed in detail with the patient. Please refer to After Visit Summary for other counseling recommendations.  Return in about 1 month (around 04/21/2019) for 3-4wk rob.   [place] Bing, MD

## 2019-04-07 ENCOUNTER — Telehealth: Payer: Self-pay

## 2019-04-07 DIAGNOSIS — Z3480 Encounter for supervision of other normal pregnancy, unspecified trimester: Secondary | ICD-10-CM

## 2019-04-07 NOTE — Telephone Encounter (Signed)
Called patient to verify access to BP cuff. Patient does not have cuff or BabyRx. Order placed to enroll and instructed patient on how to sign up. Instructed patient on proper way to check BP and warning signs reviewed with patient.   Rolm Bookbinder, CNM 04/07/19 10:38 AM

## 2019-04-21 ENCOUNTER — Ambulatory Visit (INDEPENDENT_AMBULATORY_CARE_PROVIDER_SITE_OTHER): Payer: Medicaid Other | Admitting: Obstetrics

## 2019-04-21 ENCOUNTER — Encounter: Payer: Self-pay | Admitting: Obstetrics

## 2019-04-21 ENCOUNTER — Other Ambulatory Visit: Payer: Self-pay

## 2019-04-21 DIAGNOSIS — O10912 Unspecified pre-existing hypertension complicating pregnancy, second trimester: Secondary | ICD-10-CM

## 2019-04-21 DIAGNOSIS — O09292 Supervision of pregnancy with other poor reproductive or obstetric history, second trimester: Secondary | ICD-10-CM

## 2019-04-21 DIAGNOSIS — Z6841 Body Mass Index (BMI) 40.0 and over, adult: Secondary | ICD-10-CM

## 2019-04-21 DIAGNOSIS — Z3A25 25 weeks gestation of pregnancy: Secondary | ICD-10-CM

## 2019-04-21 DIAGNOSIS — Z3480 Encounter for supervision of other normal pregnancy, unspecified trimester: Secondary | ICD-10-CM

## 2019-04-21 DIAGNOSIS — O10919 Unspecified pre-existing hypertension complicating pregnancy, unspecified trimester: Secondary | ICD-10-CM

## 2019-04-21 DIAGNOSIS — Z8759 Personal history of other complications of pregnancy, childbirth and the puerperium: Secondary | ICD-10-CM

## 2019-04-21 NOTE — Progress Notes (Signed)
Pt presents for webex visit. Pt identified with two pt identifiers. She is currently [redacted]w[redacted]d. She is still waiting on her bp cuff to come. This has been ordered. Pt has no concerns.

## 2019-04-21 NOTE — Progress Notes (Signed)
   TELEHEALTH VIRTUAL OBSTETRICS PRENATAL VISIT ENCOUNTER NOTE  I connected with Tanya Moreno on 04/21/19 at 10:30 AM EDT by WebEx at home and verified that I am speaking with the correct person using two identifiers.   I discussed the limitations, risks, security and privacy concerns of performing an evaluation and management service by telephone and the availability of in person appointments. I also discussed with the patient that there may be a patient responsible charge related to this service. The patient expressed understanding and agreed to proceed. Subjective:  Tanya Moreno is a 28 y.o. [phone] at [redacted]w[redacted]d being seen today for ongoing prenatal care.  She is currently monitored for the following issues for this high-risk pregnancy and has Latex allergy; History of HSV; BMI 40.0-44.9, adult (HCC); Obesity in pregnancy, antepartum; Carpal tunnel syndrome during pregnancy; Supervision of normal pregnancy, antepartum; Abnormal Papanicolaou smear of cervix with positive human papilloma virus (HPV) test; and Chronic hypertension affecting pregnancy on their problem list.  Patient reports no complaints.  Reports fetal movement. Contractions: Not present. Vag. Bleeding: None.  Movement: Present. Denies any contractions, bleeding or leaking of fluid.   The following portions of the patient's history were reviewed and updated as appropriate: allergies, current medications, past family history, past medical history, past social history, past surgical history and problem list.   Objective:  There were no vitals filed for this visit.  Fetal Status:     Movement: Present     General:  Alert, oriented and cooperative. Patient is in no acute distress.  Respiratory: Normal respiratory effort, no problems with respiration noted  Mental Status: Normal mood and affect. Normal behavior. Normal judgment and thought content.  Rest of physical exam deferred due to type of encounter  Assessment and Plan:   Pregnancy: G4P3003 at [redacted]w[redacted]d 1. Supervision of other normal pregnancy, antepartum Rx: - Babyscripts Schedule Optimization  2. Chronic hypertension affecting pregnancy - BP's clinically stable  3. History of gestational hypertension  4. BMI 40.0-44.9, adult (HCC)   Preterm labor symptoms and general obstetric precautions including but not limited to vaginal bleeding, contractions, leaking of fluid and fetal movement were reviewed in detail with the patient. I discussed the assessment and treatment plan with the patient. The patient was provided an opportunity to ask questions and all were answered. The patient agreed with the plan and demonstrated an understanding of the instructions. The patient was advised to call back or seek an in-person office evaluation/go to [facility]U at [facility] for any urgent or concerning symptoms. Please refer to After Visit Summary for other counseling recommendations.   I provided 10 minutes of face-to-face via WebEx time during this encounter.  Return in about 2 weeks (around 05/05/2019) for ROB, 2 hour OGTT.  Future Appointments  Date Time Provider Department Center  04/27/2019  1:15 PM WH-MFC [place] 4 WH-MFCUS MFC-US    Tanya Ceo, MD [facility], [facility] 04-21-2019

## 2019-04-27 ENCOUNTER — Encounter (HOSPITAL_COMMUNITY): Payer: Self-pay

## 2019-04-27 ENCOUNTER — Ambulatory Visit (HOSPITAL_COMMUNITY): Payer: Commercial Managed Care - PPO | Attending: Obstetrics and Gynecology

## 2019-05-04 ENCOUNTER — Encounter: Payer: Medicaid Other | Admitting: Obstetrics & Gynecology

## 2019-05-04 ENCOUNTER — Other Ambulatory Visit: Payer: Commercial Managed Care - PPO

## 2019-05-10 ENCOUNTER — Other Ambulatory Visit: Payer: Commercial Managed Care - PPO

## 2019-05-10 ENCOUNTER — Encounter: Payer: Medicaid Other | Admitting: Obstetrics & Gynecology

## 2019-05-16 ENCOUNTER — Other Ambulatory Visit: Payer: Medicaid Other

## 2019-05-16 ENCOUNTER — Other Ambulatory Visit: Payer: Self-pay

## 2019-05-16 ENCOUNTER — Ambulatory Visit (INDEPENDENT_AMBULATORY_CARE_PROVIDER_SITE_OTHER): Payer: Medicaid Other | Admitting: Obstetrics & Gynecology

## 2019-05-16 VITALS — BP 129/88 | HR 96 | Wt 290.0 lb

## 2019-05-16 DIAGNOSIS — Z3480 Encounter for supervision of other normal pregnancy, unspecified trimester: Secondary | ICD-10-CM

## 2019-05-16 DIAGNOSIS — O99213 Obesity complicating pregnancy, third trimester: Secondary | ICD-10-CM

## 2019-05-16 DIAGNOSIS — O10913 Unspecified pre-existing hypertension complicating pregnancy, third trimester: Secondary | ICD-10-CM

## 2019-05-16 DIAGNOSIS — Z3A29 29 weeks gestation of pregnancy: Secondary | ICD-10-CM

## 2019-05-16 DIAGNOSIS — O10919 Unspecified pre-existing hypertension complicating pregnancy, unspecified trimester: Secondary | ICD-10-CM

## 2019-05-16 DIAGNOSIS — O99210 Obesity complicating pregnancy, unspecified trimester: Secondary | ICD-10-CM

## 2019-05-16 MED ORDER — COMFORT FIT MATERNITY SUPP LG MISC: 1.0000 | Freq: Once | 0 refills | Status: AC

## 2019-05-16 NOTE — Progress Notes (Signed)
ROB/GTT. 

## 2019-05-16 NOTE — Progress Notes (Signed)
   PRENATAL VISIT NOTE  Subjective:  Tanya Moreno is a 28 y.o. [phone] at [redacted]w[redacted]d being seen today for ongoing prenatal care.  She is currently monitored for the following issues for this high-risk pregnancy and has Latex allergy; History of HSV; BMI 40.0-44.9, adult ([place]); Obesity in pregnancy, antepartum; Carpal tunnel syndrome during pregnancy; Supervision of normal pregnancy, antepartum; Abnormal Papanicolaou smear of cervix with positive human papilloma virus (HPV) test; and Chronic hypertension affecting pregnancy on their problem list.  Patient reports pelvic pressure.  Contractions: Not present. Vag. Bleeding: None.  Movement: Present. Denies leaking of fluid.   The following portions of the patient's history were reviewed and updated as appropriate: allergies, current medications, past family history, past medical history, past social history, past surgical history and problem list.   Objective:   Vitals:   05/16/19 1005  BP: 129/88  Pulse: 96  Weight: 290 lb (131.5 kg)    Fetal Status: Fetal Heart Rate (bpm): 150   Movement: Present     General:  Alert, oriented and cooperative. Patient is in no acute distress.  Skin: Skin is warm and dry. No rash noted.   Cardiovascular: Normal heart rate noted  Respiratory: Normal respiratory effort, no problems with respiration noted  Abdomen: Soft, gravid, appropriate for gestational age.  Pain/Pressure: Present     Pelvic: Cervical exam deferred        Extremities: Normal range of motion.     Mental Status: Normal mood and affect. Normal behavior. Normal judgment and thought content.   Assessment and Plan:  Pregnancy: G4P3003 at [redacted]w[redacted]d 1. Supervision of other normal pregnancy, antepartum F/u US ordered - CBC - HIV antibody (with reflex) - RPR - Glucose tolerance, 1 hour - Elastic Bandages & Supports (COMFORT FIT MATERNITY SUPP LG) MISC; 1 each by Does not apply route once for 1 dose.  Dispense: 1 each; Refill: 0  2. Obesity in  pregnancy, antepartum   3. Chronic hypertension affecting pregnancy BP stable on labetalol  Preterm labor symptoms and general obstetric precautions including but not limited to vaginal bleeding, contractions, leaking of fluid and fetal movement were reviewed in detail with the patient. Please refer to After Visit Summary for other counseling recommendations.   Return in about 2 weeks (around 05/30/2019) for virtual.  No future appointments.  Emeterio Reeve, MD

## 2019-05-16 NOTE — Patient Instructions (Signed)

## 2019-05-19 LAB — CBC
Hematocrit: 33.5 % — ABNORMAL LOW (ref 34.0–46.6)
Hemoglobin: 11.2 g/dL (ref 11.1–15.9)
MCH: 30.6 pg (ref 26.6–33.0)
MCHC: 33.4 g/dL (ref 31.5–35.7)
MCV: 92 fL (ref 79–97)
Platelets: 233 10*3/uL (ref 150–450)
RBC: 3.66 x10E6/uL — ABNORMAL LOW (ref 3.77–5.28)
RDW: 13.2 % (ref 11.7–15.4)
WBC: 7.2 10*3/uL (ref 3.4–10.8)

## 2019-05-19 LAB — HIV ANTIBODY (ROUTINE TESTING W REFLEX): HIV Screen 4th Generation wRfx: NONREACTIVE

## 2019-05-19 LAB — RPR: RPR Ser Ql: NONREACTIVE

## 2019-05-19 LAB — GLUCOSE, RANDOM: Glucose: 110 mg/dL — ABNORMAL HIGH (ref 65–99)

## 2019-05-24 ENCOUNTER — Other Ambulatory Visit: Payer: Self-pay

## 2019-05-24 ENCOUNTER — Ambulatory Visit (HOSPITAL_COMMUNITY)
Admission: RE | Admit: 2019-05-24 | Discharge: 2019-05-24 | Disposition: A | Discharge status: Home / Self Care | Payer: Commercial Managed Care - PPO | Source: Ambulatory Visit | Attending: Obstetrics and Gynecology | Admitting: Obstetrics and Gynecology

## 2019-05-24 ENCOUNTER — Other Ambulatory Visit: Payer: Self-pay | Admitting: Obstetrics & Gynecology

## 2019-05-24 DIAGNOSIS — Z362 Encounter for other antenatal screening follow-up: Secondary | ICD-10-CM | POA: Diagnosis not present

## 2019-05-24 DIAGNOSIS — O99213 Obesity complicating pregnancy, third trimester: Secondary | ICD-10-CM

## 2019-05-24 DIAGNOSIS — Z3A30 30 weeks gestation of pregnancy: Secondary | ICD-10-CM | POA: Diagnosis not present

## 2019-05-24 DIAGNOSIS — Z3480 Encounter for supervision of other normal pregnancy, unspecified trimester: Secondary | ICD-10-CM | POA: Diagnosis present

## 2019-05-24 DIAGNOSIS — O10013 Pre-existing essential hypertension complicating pregnancy, third trimester: Secondary | ICD-10-CM | POA: Diagnosis not present

## 2019-05-30 ENCOUNTER — Ambulatory Visit (INDEPENDENT_AMBULATORY_CARE_PROVIDER_SITE_OTHER): Payer: Commercial Managed Care - PPO | Admitting: Obstetrics

## 2019-05-30 ENCOUNTER — Encounter: Payer: Self-pay | Admitting: Obstetrics

## 2019-05-30 DIAGNOSIS — O10919 Unspecified pre-existing hypertension complicating pregnancy, unspecified trimester: Secondary | ICD-10-CM

## 2019-05-30 DIAGNOSIS — Z3A31 31 weeks gestation of pregnancy: Secondary | ICD-10-CM

## 2019-05-30 DIAGNOSIS — O0990 Supervision of high risk pregnancy, unspecified, unspecified trimester: Secondary | ICD-10-CM

## 2019-05-30 DIAGNOSIS — O99210 Obesity complicating pregnancy, unspecified trimester: Secondary | ICD-10-CM

## 2019-05-30 DIAGNOSIS — O10913 Unspecified pre-existing hypertension complicating pregnancy, third trimester: Secondary | ICD-10-CM

## 2019-05-30 DIAGNOSIS — O0993 Supervision of high risk pregnancy, unspecified, third trimester: Secondary | ICD-10-CM

## 2019-05-30 DIAGNOSIS — O99213 Obesity complicating pregnancy, third trimester: Secondary | ICD-10-CM

## 2019-05-30 NOTE — Progress Notes (Signed)
Pt presents for webex visit. Pt identified with 2 patient identifiers. She is currently [redacted]w[redacted]d today. Pt unable to check bp at this time.She states that she last checked her bp on Sat, and states that it was "normal".  Pt would like to discuss rather or not if she should be taking her bp medication.

## 2019-05-30 NOTE — Progress Notes (Signed)
[place] VIRTUAL VIDEO VISIT ENCOUNTER NOTE  Provider location: [facility] [place] [facility] [place]   I connected with Tanya Moreno on 05/30/19 at  3:00 PM EDT by WebEx OB MyChart Video Encounter at home and verified that I am speaking with the correct person using two identifiers.   I discussed the limitations, risks, security and privacy concerns of performing an evaluation and management service by telephone and the availability of in person appointments. I also discussed with the patient that there may be a patient responsible charge related to this service. The patient expressed understanding and agreed to proceed. Subjective:  Tanya Moreno is a 28 y.o. [phone] at [redacted]w[redacted]d being seen today for ongoing prenatal care.  She is currently monitored for the following issues for this high-risk pregnancy and has Latex allergy; History of HSV; BMI 40.0-44.9, adult ([place]); Obesity in pregnancy, antepartum; Carpal tunnel syndrome during pregnancy; Supervision of normal pregnancy, antepartum; Abnormal Papanicolaou smear of cervix with positive human papilloma virus (HPV) test; and Chronic hypertension affecting pregnancy on their problem list.  Patient reports backache and heartburn.  Contractions: Not present. Vag. Bleeding: None.  Movement: Present. Denies any leaking of fluid.   The following portions of the patient's history were reviewed and updated as appropriate: allergies, current medications, past family history, past medical history, past social history, past surgical history and problem list.   Objective:  There were no vitals filed for this visit.  Fetal Status:     Movement: Present     General:  Alert, oriented and cooperative. Patient is in no acute distress.  Respiratory: Normal respiratory effort, no problems with respiration noted  Mental Status: Normal mood and affect. Normal behavior. Normal judgment and thought content.  Rest of physical exam  deferred due to type of encounter  Imaging: [place] Mfm Ob Follow Up  Result Date: 05/24/2019 ----------------------------------------------------------------------  OBSTETRICS REPORT                       (Signed Final 05/24/2019 04:46 pm) ---------------------------------------------------------------------- Patient Info  ID #:       [id number]                          D.O.B.:  [birth year] (28 yrs)  Name:       Tanya Moreno                 Visit Date: 05/24/2019 03:08 pm ---------------------------------------------------------------------- Performed By  Performed By:     Ellin Saba          Ref. Address:     [address]  [address]                                                             [place]                                                             [id number]  Attending:        Lin Landsmanorenthian Booker      [facility]  [facility]:      [facility] ---------------------------------------------------------------------- Orders   #  Description                          Code         Ordered By   1  US MFM OB FOLLOW UP                  [id number]     JAMES ARNOLD  ----------------------------------------------------------------------   #  Order #                    Accession #                 Episode #   1  [id number]                  [id number]  ---------------------------------------------------------------------- Indications   [redacted] weeks gestation of pregnancy                Z3A.30   Encounter for antenatal screening for          Z36.3   malformations   Obesity complicating pregnancy, second         O99.212   trimester (pregravid BMI 45)   Hypertension - Chronic/Pre-existing            O10.019   (labetalol)   ---------------------------------------------------------------------- Vital Signs  Weight (lb): 290                               Height:        5'4"  BMI:         49.77 ---------------------------------------------------------------------- Fetal Evaluation  Num Of Fetuses:         1  Fetal Heart Rate(bpm):  141  Cardiac Activity:       Observed  Presentation:           Cephalic  Placenta:               Posterior  P. Cord Insertion:  Visualized, central  Amniotic Fluid  AFI FV:      Within normal limits  AFI Sum(cm)     %Tile       Largest Pocket(cm)  10.26           16          3.19  RUQ(cm)       RLQ(cm)       LUQ(cm)        LLQ(cm)  2.35          1.78          2.94           3.19 ---------------------------------------------------------------------- Biometry  BPD:      77.3  mm     G. Age:  31w 0d         52  %    CI:        76.36   %    70 - 86                                                          FL/HC:      22.3   %    19.3 - 21.3  HC:      280.3  mm     G. Age:  30w 5d         20  %    HC/AC:      0.99        0.96 - 1.17  AC:      282.8  mm     G. Age:  32w 2d         89  %    FL/BPD:     80.9   %    71 - 87  FL:       62.5  mm     G. Age:  32w 2d         82  %    FL/AC:      22.1   %    20 - 24  HUM:      58.1  mm     G. Age:  33w 5d       > 95  %  CER:      39.4  mm     G. Age:  33w 3d         93  %  LV:        5.4  mm  CM:          9  mm  Est. FW:    1891  gm      4 lb 3 oz     78  % ---------------------------------------------------------------------- OB History  Gravidity:    4         Term:   3        Prem:   0        SAB:   0  TOP:          0       Ectopic:  0        Living: 3 ---------------------------------------------------------------------- Gestational Age  LMP:           32w 2d        Date:  10/10/18  EDD:   07/17/19  U/S Today:     31w 4d                                        EDD:   07/22/19  Best:          30w 4d     Det. ByMarcella Dubs         EDD:   07/29/19                                       (01/06/19) ---------------------------------------------------------------------- Anatomy  Cranium:               Appears normal         LVOT:                   Not well visualized  Cavum:                 Appears normal         Aortic Arch:            Previously seen  Ventricles:            Appears normal         Ductal Arch:            Previously seen  Choroid Plexus:        Appears normal         Diaphragm:              Appears normal  Cerebellum:            Appears normal         Stomach:                Appears normal, left                                                                        sided  Posterior Fossa:       Appears normal         Abdomen:                Appears normal  Nuchal Fold:           Not applicable (>20    Abdominal Wall:         Appears nml (cord                         wks GA)                                        insert, abd wall)  Face:                  Orbits previously      Cord Vessels:           Appears normal (3  seen, profile normal                           vessel cord)  Lips:                  Appears normal         Kidneys:                Appear normal  Palate:                Appears normal         Bladder:                Appears normal  Thoracic:              Appears normal         Spine:                  Previously seen  Heart:                 Not well visualized    Upper Extremities:      Previously seen  RVOT:                  Not well visualized    Lower Extremities:      Previously seen  Other:  Heels previously visualized. Nasal bone visualized. ---------------------------------------------------------------------- Cervix Uterus Adnexa  Cervix  Not visualized (advanced GA >24wks)  Left Ovary  Within normal limits.  Right Ovary  Within normal limits.  Adnexa  No abnormality visualized. ---------------------------------------------------------------------- Impression  Normal interval growth.  No ultrasonic evidence  of structural  fetal anomalies.  Chronic Hypertension on meds ---------------------------------------------------------------------- Recommendations  Follow growth in 4 weeks  Initiate weekly testing at 32 weeks. ----------------------------------------------------------------------               Lin Landsmanorenthian Booker, MD Electronically Signed Final Report   05/24/2019 04:46 pm ----------------------------------------------------------------------   Assessment and Plan:  Pregnancy: O9G2952G4P3003 at 2536w3d 1. Supervision of high risk pregnancy, antepartum  2. Obesity in pregnancy, antepartum  3. Chronic hypertension affecting pregnancy - BP's stable  Preterm labor symptoms and general obstetric precautions including but not limited to vaginal bleeding, contractions, leaking of fluid and fetal movement were reviewed in detail with the patient. I discussed the assessment and treatment plan with the patient. The patient was provided an opportunity to ask questions and all were answered. The patient agreed with the plan and demonstrated an understanding of the instructions. The patient was advised to call back or seek an in-person office evaluation/[facility] for any urgent or concerning symptoms. Please refer to After Visit Summary for other counseling recommendations.   I provided 10 minutes of face-to-face time during this encounter.  Return in about 2 weeks (around 06[facility]13/2019[facility]  Future Appointments  Date Time Provider Department Center  05/30/2019  3:00 PM Brock BadHarper, Hanako Tipping A, MD [facility] None    Coral Ceoharles Alissia Lory, MD [facility] 05-30-2019

## 2019-06-13 ENCOUNTER — Encounter: Payer: Medicaid Other | Admitting: Obstetrics & Gynecology

## 2019-06-14 ENCOUNTER — Telehealth: Payer: Self-pay | Admitting: Obstetrics & Gynecology

## 2019-06-15 ENCOUNTER — Ambulatory Visit (INDEPENDENT_AMBULATORY_CARE_PROVIDER_SITE_OTHER): Payer: Medicaid Other | Admitting: Obstetrics & Gynecology

## 2019-06-15 ENCOUNTER — Encounter: Payer: Self-pay | Admitting: Obstetrics & Gynecology

## 2019-06-15 ENCOUNTER — Other Ambulatory Visit: Payer: Self-pay

## 2019-06-15 DIAGNOSIS — O99213 Obesity complicating pregnancy, third trimester: Secondary | ICD-10-CM

## 2019-06-15 DIAGNOSIS — O10919 Unspecified pre-existing hypertension complicating pregnancy, unspecified trimester: Secondary | ICD-10-CM

## 2019-06-15 DIAGNOSIS — O99210 Obesity complicating pregnancy, unspecified trimester: Secondary | ICD-10-CM

## 2019-06-15 DIAGNOSIS — O10913 Unspecified pre-existing hypertension complicating pregnancy, third trimester: Secondary | ICD-10-CM

## 2019-06-15 DIAGNOSIS — Z3480 Encounter for supervision of other normal pregnancy, unspecified trimester: Secondary | ICD-10-CM

## 2019-06-15 DIAGNOSIS — O2641 Herpes gestationis, first trimester: Secondary | ICD-10-CM

## 2019-06-15 DIAGNOSIS — O2643 Herpes gestationis, third trimester: Secondary | ICD-10-CM

## 2019-06-15 DIAGNOSIS — Z3A33 33 weeks gestation of pregnancy: Secondary | ICD-10-CM

## 2019-06-15 NOTE — Progress Notes (Signed)
I connected with Tanya Moreno on 06/15/19 at 10:45 AM EDT by telephone and verified that I am speaking with the correct person using two identifiers.   Pt does not have access to her cuff. She is not at home. Employer took her out of work due to [place] 19 but she needs FMLA forms completed to officially start maternity leave.

## 2019-06-15 NOTE — Progress Notes (Signed)
TELEHEALTH OBSTETRICS PRENATAL VIRTUAL VIDEO VISIT ENCOUNTER NOTE  Provider [facility]: [facility]   I connected with Tanya Moreno on 06/15/19 at 10:45 AM EDT by WebEx Video Encounter at home and verified that I am speaking with the correct person using two identifiers.   I discussed the limitations, risks, security and privacy concerns of performing an evaluation and management service virtually and the availability of in person appointments. I also discussed with the patient that there may be a patient responsible charge related to this service. The patient expressed understanding and agreed to proceed. Subjective:  Tanya Moreno is a 28 y.o. [phone] at 6250w5d being seen today for ongoing prenatal care.  She is currently monitored for the following issues for this high-risk pregnancy and has Latex allergy; History of HSV; BMI 40.0-44.9, adult (HCC); Obesity in pregnancy, antepartum; Carpal tunnel syndrome during pregnancy; Supervision of normal pregnancy, antepartum; Abnormal Papanicolaou smear of cervix with positive human papilloma virus (HPV) test; and Chronic hypertension affecting pregnancy on their problem list.  Patient reports occasional contractions.  Contractions: Irritability. Vag. Bleeding: None.  Movement: Present. Denies any leaking of fluid.   The following portions of the patient's history were reviewed and updated as appropriate: allergies, current medications, past family history, past medical history, past social history, past surgical history and problem list.   Objective:  There were no vitals filed for this visit.  Fetal Status:     Movement: Present     General:  Alert, oriented and cooperative. Patient is in no acute distress.  Respiratory: Normal respiratory effort, no problems with respiration noted  Mental Status: Normal mood and affect. Normal behavior. Normal judgment and thought content.  Rest of physical exam deferred due to  type of encounter  Imaging: [place] Mfm Ob Follow Up  Result Date: 05/24/2019 ----------------------------------------------------------------------  OBSTETRICS REPORT                       (Signed Final 05/24/2019 04:46 pm) ---------------------------------------------------------------------- Patient Info  ID #:       [id number]                          D.O.B.:  Oct 18, 1991 (28 yrs)  Name:       Tanya Moreno                 Visit Date: 05/24/2019 03:08 pm ---------------------------------------------------------------------- Performed By  Performed By:     Rennie PlowmanErica Lyskawa          Ref. Address:     [address] [place]                    [address]                                                             [address]                                                             [address] [id number]  [place]                                                             [place]  Attending:        Sander Nephew      [facility]  Referred By:      [facility] ---------------------------------------------------------------------- Orders   #  Description                          Code         Ordered By   1  [place] MFM OB FOLLOW UP                  76816.01     JAMES ARNOLD  ----------------------------------------------------------------------   #  Order #                    Accession #                 Episode #   1  [id number]                  [id number]                  [id number]  ---------------------------------------------------------------------- Indications   [redacted] weeks gestation of pregnancy                Z3A.30   Encounter for antenatal screening for          Z36.3   malformations   Obesity complicating pregnancy, second         O99.212   trimester (pregravid BMI 45)   Hypertension - Chronic/Pre-existing            O10.019   (labetalol)   ---------------------------------------------------------------------- Vital Signs  Weight (lb): 290                               Height:        5'4"  BMI:         49.77 ---------------------------------------------------------------------- Fetal Evaluation  Num Of Fetuses:         1  Fetal Heart Rate(bpm):  141  Cardiac Activity:       Observed  Presentation:           Cephalic  Placenta:               Posterior  P. Cord Insertion:      Visualized, central  Amniotic Fluid  AFI FV:      Within normal limits  AFI Sum(cm)     %Tile       Largest Pocket(cm)  10.26           16          3.19  RUQ(cm)  RLQ(cm)       LUQ(cm)        LLQ(cm)  2.35          1.78          2.94           3.19 ---------------------------------------------------------------------- Biometry  BPD:      77.3  mm     G. Age:  31w 0d         52  %    CI:        76.36   %    70 - 86                                                          FL/HC:      22.3   %    19.3 - 21.3  HC:      280.3  mm     G. Age:  30w 5d         20  %    HC/AC:      0.99        0.96 - 1.17  AC:      282.8  mm     G. Age:  32w 2d         89  %    FL/BPD:     80.9   %    71 - 87  FL:       62.5  mm     G. Age:  32w 2d         82  %    FL/AC:      22.1   %    20 - 24  HUM:      58.1  mm     G. Age:  33w 5d       > 95  %  CER:      39.4  mm     G. Age:  33w 3d         93  %  LV:        5.4  mm  CM:          9  mm  Est. FW:    1891  gm      4 lb 3 oz     78  % ---------------------------------------------------------------------- OB History  Gravidity:    4         Term:   3        Prem:   0        SAB:   0  TOP:          0       Ectopic:  0        Living: 3 ---------------------------------------------------------------------- Gestational Age  LMP:           32w 2d        Date:  10/10/18                 EDD:   07/17/19  U/S Today:     31w 4d  EDD:   07/22/19  Best:          30w 4d     Det. ByMarcella Dubs:  Early Ultrasound         EDD:   07/29/19                                       (01/06/19) ---------------------------------------------------------------------- Anatomy  Cranium:               Appears normal         LVOT:                   Not well visualized  Cavum:                 Appears normal         Aortic Arch:            Previously seen  Ventricles:            Appears normal         Ductal Arch:            Previously seen  Choroid Plexus:        Appears normal         Diaphragm:              Appears normal  Cerebellum:            Appears normal         Stomach:                Appears normal, left                                                                        sided  Posterior Fossa:       Appears normal         Abdomen:                Appears normal  Nuchal Fold:           Not applicable (>20    Abdominal Wall:         Appears nml (cord                         wks GA)                                        insert, abd wall)  Face:                  Orbits previously      Cord Vessels:           Appears normal (3                         seen, profile normal                           vessel cord)  Lips:  Appears normal         Kidneys:                Appear normal  Palate:                Appears normal         Bladder:                Appears normal  Thoracic:              Appears normal         Spine:                  Previously seen  Heart:                 Not well visualized    Upper Extremities:      Previously seen  RVOT:                  Not well visualized    Lower Extremities:      Previously seen  Other:  Heels previously visualized. Nasal bone visualized. ---------------------------------------------------------------------- Cervix Uterus Adnexa  Cervix  Not visualized (advanced GA >24wks)  Left Ovary  Within normal limits.  Right Ovary  Within normal limits.  Adnexa  No abnormality visualized. ---------------------------------------------------------------------- Impression  Normal interval growth.  No ultrasonic evidence  of structural  fetal anomalies.  Chronic Hypertension on meds ---------------------------------------------------------------------- Recommendations  Follow growth in 4 weeks  Initiate weekly testing at 32 weeks. ----------------------------------------------------------------------               Lin Landsmanorenthian Booker, MD Electronically Signed Final Report   05/24/2019 04:46 pm ----------------------------------------------------------------------   Assessment and Plan:  Pregnancy: [id number] at 7450w5d 1. Supervision of other normal pregnancy, antepartum No fetal concerns BP have been good  2. Obesity in pregnancy, antepartum 05/24/2019 FW:    1891  gm      4 lb 3 oz     78  % 3. History of HSV Needs Valtrex next visit Last outbreak 2014.   4. Chronic hypertension affecting pregnancy Labetolol 100mg  bid Pt is also taking baby ASA daily Pt to check BP weekly she will     Preterm labor symptoms and general obstetric precautions including but not limited to vaginal bleeding, contractions, leaking of fluid and fetal movement were reviewed in detail with the patient. I discussed the assessment and treatment plan with the patient. The patient was provided an opportunity to ask questions and all were answered. The patient agreed with the plan and demonstrated an understanding of the instructions. The patient was advised to call back or seek an in-person office evaluation/[facility] for any urgent or concerning symptoms. Please refer to After Visit Summary for other counseling recommendations.   I provided 15 minutes of face-to-face time during this encounter.  F/u in 2 1/2 week for ROB with cx  No future appointments.  Willodean Rosenthalarolyn Harraway-Smith, MD [facility], [facility]

## 2019-06-26 ENCOUNTER — Ambulatory Visit (HOSPITAL_COMMUNITY): Payer: Medicaid Other

## 2019-06-26 ENCOUNTER — Inpatient Hospital Stay (HOSPITAL_COMMUNITY)
Admission: AD | Admit: 2019-06-26 | Discharge: 2019-06-26 | Disposition: A | Discharge status: Home / Self Care | Payer: Commercial Managed Care - PPO | Attending: Obstetrics & Gynecology | Admitting: Obstetrics & Gynecology

## 2019-06-26 ENCOUNTER — Other Ambulatory Visit: Payer: Self-pay

## 2019-06-26 ENCOUNTER — Encounter (HOSPITAL_COMMUNITY): Payer: Self-pay

## 2019-06-26 DIAGNOSIS — R109 Unspecified abdominal pain: Secondary | ICD-10-CM | POA: Insufficient documentation

## 2019-06-26 DIAGNOSIS — Z87891 Personal history of nicotine dependence: Secondary | ICD-10-CM | POA: Insufficient documentation

## 2019-06-26 DIAGNOSIS — Z79899 Other long term (current) drug therapy: Secondary | ICD-10-CM | POA: Diagnosis not present

## 2019-06-26 DIAGNOSIS — O4700 False labor before 37 completed weeks of gestation, unspecified trimester: Secondary | ICD-10-CM

## 2019-06-26 DIAGNOSIS — O4703 False labor before 37 completed weeks of gestation, third trimester: Secondary | ICD-10-CM | POA: Diagnosis present

## 2019-06-26 DIAGNOSIS — B9689 Other specified bacterial agents as the cause of diseases classified elsewhere: Secondary | ICD-10-CM

## 2019-06-26 DIAGNOSIS — N760 Acute vaginitis: Secondary | ICD-10-CM | POA: Insufficient documentation

## 2019-06-26 DIAGNOSIS — O98513 Other viral diseases complicating pregnancy, third trimester: Secondary | ICD-10-CM | POA: Insufficient documentation

## 2019-06-26 DIAGNOSIS — Z3A35 35 weeks gestation of pregnancy: Secondary | ICD-10-CM | POA: Insufficient documentation

## 2019-06-26 DIAGNOSIS — Z7982 Long term (current) use of aspirin: Secondary | ICD-10-CM | POA: Insufficient documentation

## 2019-06-26 DIAGNOSIS — O26893 Other specified pregnancy related conditions, third trimester: Secondary | ICD-10-CM | POA: Diagnosis not present

## 2019-06-26 DIAGNOSIS — Z7901 Long term (current) use of anticoagulants: Secondary | ICD-10-CM | POA: Insufficient documentation

## 2019-06-26 DIAGNOSIS — O10913 Unspecified pre-existing hypertension complicating pregnancy, third trimester: Secondary | ICD-10-CM | POA: Diagnosis not present

## 2019-06-26 DIAGNOSIS — O479 False labor, unspecified: Secondary | ICD-10-CM

## 2019-06-26 HISTORY — DX: Essential (primary) hypertension: I10

## 2019-06-26 LAB — URINALYSIS, COMPLETE (UACMP) WITH MICROSCOPIC
Bilirubin Urine: NEGATIVE
Glucose, UA: NEGATIVE mg/dL
Hgb urine dipstick: NEGATIVE
Ketones, ur: NEGATIVE mg/dL
Leukocytes,Ua: NEGATIVE
Nitrite: NEGATIVE
Protein, ur: NEGATIVE mg/dL
Specific Gravity, Urine: 1.018 (ref 1.005–1.030)
pH: 6 (ref 5.0–8.0)

## 2019-06-26 LAB — WET PREP, GENITAL
Sperm: NONE SEEN
Trich, Wet Prep: NONE SEEN
Yeast Wet Prep HPF POC: NONE SEEN

## 2019-06-26 MED ORDER — METRONIDAZOLE 500 MG PO TABS: 500.0000 mg | ORAL_TABLET | Freq: Two times a day (BID) | ORAL | 0 refills | Status: DC

## 2019-06-26 MED ORDER — NIFEDIPINE 10 MG PO CAPS
10.0000 mg | ORAL_CAPSULE | ORAL | Status: DC | PRN
  Administered 2019-06-26: 10 mg via ORAL
  Filled 2019-06-26: qty 1

## 2019-06-26 NOTE — Discharge Instructions (Signed)

## 2019-06-26 NOTE — MAU Note (Signed)
.   Tanya Moreno is a 28 y.o. at [redacted]w[redacted]d here in MAU reporting:contractions that started around 9 this morning. Denies any VB or LOF. +FM LMP:  Onset of complaint: 0900 Pain score: 9 Vitals:   06/26/19 1114  BP: 128/75  Pulse: 95  Resp: 16  Temp: 98.4 F (36.9 C)  SpO2: 100%     FHT:135 Lab orders placed from triage:

## 2019-06-26 NOTE — MAU Provider Note (Addendum)
Patient Tanya Moreno is a 28 y.o. [phone] At [redacted]w[redacted]d here with complaints of contractions that started at 9 am. She denies bleeding, LOF, decreased fetal movements. She has chronic hypertensive on labetalol 100 mg BID. She took her medicine this morning.   She denies ha, blurred vision, NV, floating spots or other complaints.  History     CSN: [id number]  Arrival date and time: 06/26/19 1042   None     Chief Complaint  Patient presents with  . Contractions   Abdominal Pain This is a new problem. The current episode started today. The problem occurs intermittently. The pain is located in the suprapubic region. The pain is at a severity of 9/10. Pain radiation: The pain radiates to her vagina.  Exacerbated by: when the pain comes she just "lays down"; she can't say what makes it better or worse.     OB History    Gravida  4   Para  3   Term  3   Preterm      AB      Living  3     SAB      TAB      Ectopic      Multiple  0   Live Births  3           Past Medical History:  Diagnosis Date  . Chlamydia   . Chlamydia infection affecting pregnancy in first trimester 02/09/2017   Positive on 02/08/17, azithromycin 1000 mg PO x 1 dose sent to pt pharmacy and pt notified by phone by MAU staff Positive on 7/30  . Genital HSV    Last outbreak Nov 2013  . Gonorrhea   . Trichomonas     Past Surgical History:  Procedure Laterality Date  . NO PAST SURGERIES      Family History  Problem Relation Age of Onset  . Heart disease Father   . Kidney disease Maternal Aunt     Social History   Tobacco Use  . Smoking status: Former Smoker    Quit date: 2015    Years since quitting: 5.5  . Smokeless tobacco: Former Systems developer    Quit date: 11/10/2014  Substance Use Topics  . Alcohol use: No    Alcohol/week: 0.0 standard drinks  . Drug use: No    Allergies:  Allergies  Allergen Reactions  . Latex Swelling    Mainly latex condoms  . Tape Itching    Medications  Prior to Admission  Medication Sig Dispense Refill Last Dose  . aspirin EC 81 MG tablet Take 1 tablet (81 mg total) by mouth daily. Take after 12 weeks for prevention of preeclampssia later in pregnancy 300 tablet 0   . doxylamine, Sleep, (UNISOM) 25 MG tablet Take 1 tablet (25 mg total) by mouth at bedtime as needed. (Patient not taking: Reported on 06/15/2019) 30 tablet 0   . fluticasone (FLONASE) 50 MCG/ACT nasal spray Place 2 sprays into both nostrils daily. (Patient not taking: Reported on 06/15/2019) 1 g 0   . ipratropium (ATROVENT) 0.06 % nasal spray Place 2 sprays into both nostrils 4 (four) times daily. (Patient not taking: Reported on 06/15/2019) 15 mL 0   . labetalol (NORMODYNE) 100 MG tablet Take 1 tablet (100 mg total) by mouth 2 (two) times daily. 60 tablet 5   . Prenatal Vit-Fe Phos-FA-Omega (VITAFOL GUMMIES) 3.33-0.333-34.8 MG CHEW Chew 1 tablet by mouth daily. 90 tablet 3   . promethazine (PHENERGAN) 25 MG tablet Take  1 tablet (25 mg total) by mouth every 6 (six) hours as needed for nausea or vomiting. (Patient not taking: Reported on 06/15/2019) 30 tablet 2   . pyridOXINE (VITAMIN B-6) 50 MG tablet Take 1 tablet (50 mg total) by mouth daily. (Patient not taking: Reported on 06/15/2019) 30 tablet 0     Review of Systems  Constitutional: Negative.   HENT: Negative.   Respiratory: Negative.   Cardiovascular: Negative.   Gastrointestinal: Positive for abdominal pain.  Genitourinary: Negative.   Musculoskeletal: Negative.   Neurological: Negative.    Physical Exam   Blood pressure 128/75, pulse 95, temperature 98.4 F (36.9 C), resp. rate 16, last menstrual period 10/10/2018, SpO2 100 %, unknown if currently breastfeeding.  Physical Exam  Constitutional: She appears well-developed.  HENT:  Head: Normocephalic.  Neck: Normal range of motion.  Respiratory: Effort normal.  GI: Soft.  Genitourinary:    Genitourinary Comments: NEFG although external genitalia looks reddneded with thin  grey discharge on her labia; cervix is long, close, posterior. No CMT, suprapubic or adnexal tenderness.    Musculoskeletal: Normal range of motion.  Neurological: She is alert.  Skin: Skin is warm.    MAU Course  Procedures  MDM -NST: 140 bpm, mod var, present acel, neg decels, patient feels contractions while in MAU but they are soft to palpation and not detected on monitor.  -wet prep shows BV -UA shows no signs of dehydration.  -one dose of procardia and patient feels better; "much" better. Declines repeat cervical exam.  -had two elevated Blood pressures in MAU; however, these pressures are in line with her other pressures.  She denies HA, floating spots,RUQ pain, blurry vision or other signs of pre-e.   Assessment and Plan   1. Bacterial vaginosis   2. Preterm contractions    2. Patient stable for discharge with recommendation to continue taking her BP medicine and stay hydrated.   3. Explained diagnosis of BV; Rx for Flagyl given.   4. Return to MAU if her condition were to change or worsen.   Charlesetta GaribaldiKathryn Lorraine Kooistra 06/26/2019, 12:11 PM

## 2019-06-27 LAB — GC/CHLAMYDIA PROBE AMP (~~LOC~~) NOT AT ARMC
Chlamydia: NEGATIVE
Neisseria Gonorrhea: NEGATIVE

## 2019-06-28 ENCOUNTER — Telehealth: Payer: Self-pay

## 2019-06-28 NOTE — Telephone Encounter (Signed)
Called pt after babyrx alert, elevated BP. Pt having abdominal pain, N&V and swelling in hands and face. Advised pt to be evaluated at the hospital, pt agreed.

## 2019-06-29 ENCOUNTER — Ambulatory Visit (INDEPENDENT_AMBULATORY_CARE_PROVIDER_SITE_OTHER): Payer: Commercial Managed Care - PPO | Admitting: Obstetrics and Gynecology

## 2019-06-29 ENCOUNTER — Other Ambulatory Visit: Payer: Self-pay

## 2019-06-29 ENCOUNTER — Encounter: Payer: Self-pay | Admitting: Obstetrics and Gynecology

## 2019-06-29 ENCOUNTER — Other Ambulatory Visit (HOSPITAL_COMMUNITY)
Admission: RE | Admit: 2019-06-29 | Discharge: 2019-06-29 | Disposition: A | Discharge status: Home / Self Care | Payer: Commercial Managed Care - PPO | Source: Ambulatory Visit | Attending: Obstetrics and Gynecology | Admitting: Obstetrics and Gynecology

## 2019-06-29 VITALS — BP 133/85 | HR 91 | Wt 292.2 lb

## 2019-06-29 DIAGNOSIS — Z3480 Encounter for supervision of other normal pregnancy, unspecified trimester: Secondary | ICD-10-CM

## 2019-06-29 DIAGNOSIS — O10919 Unspecified pre-existing hypertension complicating pregnancy, unspecified trimester: Secondary | ICD-10-CM

## 2019-06-29 DIAGNOSIS — O2643 Herpes gestationis, third trimester: Secondary | ICD-10-CM

## 2019-06-29 DIAGNOSIS — O2641 Herpes gestationis, first trimester: Secondary | ICD-10-CM

## 2019-06-29 DIAGNOSIS — O10913 Unspecified pre-existing hypertension complicating pregnancy, third trimester: Secondary | ICD-10-CM

## 2019-06-29 DIAGNOSIS — Z3A35 35 weeks gestation of pregnancy: Secondary | ICD-10-CM

## 2019-06-29 MED ORDER — VALACYCLOVIR HCL 500 MG PO TABS: 500.0000 mg | ORAL_TABLET | Freq: Two times a day (BID) | ORAL | 6 refills | Status: DC

## 2019-06-29 NOTE — Progress Notes (Signed)
Pt is here for ROB. [redacted]w[redacted]d.

## 2019-06-29 NOTE — Progress Notes (Signed)
Subjective:  Tanya Moreno is a 28 y.o. [phone] at [redacted]w[redacted]d being seen today for ongoing prenatal care.  She is currently monitored for the following issues for this high-risk pregnancy and has Latex allergy; History of HSV; BMI 40.0-44.9, adult ([place]); Obesity in pregnancy, antepartum; Carpal tunnel syndrome during pregnancy; Supervision of normal pregnancy, antepartum; Abnormal Papanicolaou smear of cervix with positive human papilloma virus (HPV) test; and Chronic hypertension affecting pregnancy on their problem list.  Patient reports general discomforts of pregnancy.  Contractions: Irritability. Vag. Bleeding: None.  Movement: Present. Denies leaking of fluid.   The following portions of the patient's history were reviewed and updated as appropriate: allergies, current medications, past family history, past medical history, past social history, past surgical history and problem list. Problem list updated.  Objective:   Vitals:   06/29/19 0935  BP: 133/85  Pulse: 91  Weight: 292 lb 3.2 oz (132.5 kg)    Fetal Status:     Movement: Present     General:  Alert, oriented and cooperative. Patient is in no acute distress.  Skin: Skin is warm and dry. No rash noted.   Cardiovascular: Normal heart rate noted  Respiratory: Normal respiratory effort, no problems with respiration noted  Abdomen: Soft, gravid, appropriate for gestational age. Pain/Pressure: Present     Pelvic:  Cervical exam performed        Extremities: Normal range of motion.  Edema: Trace  Mental Status: Normal mood and affect. Normal behavior. Normal judgment and thought content.   Urinalysis:      Assessment and Plan:  Pregnancy: G4P3003 at [redacted]w[redacted]d  1. Supervision of other normal pregnancy, antepartum Stable Labor precautions - Strep Gp B NAA - Cervicovaginal ancillary only( [place])  2. Chronic hypertension affecting pregnancy BP stable Continue with BASA and Labetalol Start weekly BPP - [place] MFM OB FOLLOW UP;  Future - [place] MFM FETAL BPP WO NON STRESS; Future  3. History of HSV  - valACYclovir (VALTREX) 500 MG tablet; Take 1 tablet (500 mg total) by mouth 2 (two) times daily.  Dispense: 60 tablet; Refill: 6  Preterm labor symptoms and general obstetric precautions including but not limited to vaginal bleeding, contractions, leaking of fluid and fetal movement were reviewed in detail with the patient. Please refer to After Visit Summary for other counseling recommendations.  Return in about 1 week (around 07/06/2019) for OB visit, face to face.   Chancy Milroy, MD

## 2019-06-30 ENCOUNTER — Encounter (HOSPITAL_COMMUNITY): Payer: Self-pay

## 2019-06-30 LAB — CERVICOVAGINAL ANCILLARY ONLY
Chlamydia: NEGATIVE
Neisseria Gonorrhea: NEGATIVE

## 2019-07-01 LAB — STREP GP B NAA: Strep Gp B NAA: POSITIVE — AB

## 2019-07-03 ENCOUNTER — Telehealth: Payer: Self-pay

## 2019-07-03 NOTE — Telephone Encounter (Signed)
Return call to pt regarding B/P. Received calls from babyscripts reporting elevated B/P 150/80 Pt not ava  Left message with family for pt to return call to office.

## 2019-07-05 ENCOUNTER — Encounter: Payer: Commercial Managed Care - PPO | Admitting: Obstetrics & Gynecology

## 2019-07-07 ENCOUNTER — Ambulatory Visit (HOSPITAL_COMMUNITY): Payer: Commercial Managed Care - PPO | Admitting: *Deleted

## 2019-07-07 ENCOUNTER — Other Ambulatory Visit: Payer: Self-pay

## 2019-07-07 ENCOUNTER — Encounter (HOSPITAL_COMMUNITY): Payer: Self-pay | Admitting: *Deleted

## 2019-07-07 ENCOUNTER — Ambulatory Visit (HOSPITAL_COMMUNITY)
Admission: RE | Admit: 2019-07-07 | Discharge: 2019-07-07 | Disposition: A | Discharge status: Home / Self Care | Payer: Commercial Managed Care - PPO | Source: Ambulatory Visit | Attending: Obstetrics and Gynecology | Admitting: Obstetrics and Gynecology

## 2019-07-07 DIAGNOSIS — O10013 Pre-existing essential hypertension complicating pregnancy, third trimester: Secondary | ICD-10-CM

## 2019-07-07 DIAGNOSIS — Z362 Encounter for other antenatal screening follow-up: Secondary | ICD-10-CM

## 2019-07-07 DIAGNOSIS — O99213 Obesity complicating pregnancy, third trimester: Secondary | ICD-10-CM

## 2019-07-07 DIAGNOSIS — Z3480 Encounter for supervision of other normal pregnancy, unspecified trimester: Secondary | ICD-10-CM | POA: Diagnosis present

## 2019-07-07 DIAGNOSIS — O10919 Unspecified pre-existing hypertension complicating pregnancy, unspecified trimester: Secondary | ICD-10-CM | POA: Insufficient documentation

## 2019-07-07 DIAGNOSIS — Z3A36 36 weeks gestation of pregnancy: Secondary | ICD-10-CM

## 2019-07-10 ENCOUNTER — Other Ambulatory Visit: Payer: Self-pay

## 2019-07-10 ENCOUNTER — Encounter: Payer: Self-pay | Admitting: Obstetrics & Gynecology

## 2019-07-10 ENCOUNTER — Ambulatory Visit (INDEPENDENT_AMBULATORY_CARE_PROVIDER_SITE_OTHER): Payer: Commercial Managed Care - PPO | Admitting: Obstetrics & Gynecology

## 2019-07-10 VITALS — BP 124/82 | HR 99 | Temp 98.7°F | Wt 292.0 lb

## 2019-07-10 DIAGNOSIS — O10913 Unspecified pre-existing hypertension complicating pregnancy, third trimester: Secondary | ICD-10-CM

## 2019-07-10 DIAGNOSIS — O99820 Streptococcus B carrier state complicating pregnancy: Secondary | ICD-10-CM

## 2019-07-10 DIAGNOSIS — Z3480 Encounter for supervision of other normal pregnancy, unspecified trimester: Secondary | ICD-10-CM

## 2019-07-10 DIAGNOSIS — Z3483 Encounter for supervision of other normal pregnancy, third trimester: Secondary | ICD-10-CM

## 2019-07-10 DIAGNOSIS — O2643 Herpes gestationis, third trimester: Secondary | ICD-10-CM

## 2019-07-10 DIAGNOSIS — O99210 Obesity complicating pregnancy, unspecified trimester: Secondary | ICD-10-CM

## 2019-07-10 DIAGNOSIS — O2641 Herpes gestationis, first trimester: Secondary | ICD-10-CM

## 2019-07-10 DIAGNOSIS — O99213 Obesity complicating pregnancy, third trimester: Secondary | ICD-10-CM

## 2019-07-10 DIAGNOSIS — Z3A37 37 weeks gestation of pregnancy: Secondary | ICD-10-CM

## 2019-07-10 DIAGNOSIS — O10919 Unspecified pre-existing hypertension complicating pregnancy, unspecified trimester: Secondary | ICD-10-CM

## 2019-07-10 NOTE — Progress Notes (Addendum)
   PRENATAL VISIT NOTE  Subjective:  Tanya Moreno is a 28 y.o. [phone] at [redacted]w[redacted]d being seen today for ongoing prenatal care.  She is currently monitored for the following issues for this high-risk pregnancy and has Latex allergy; History of HSV; BMI 40.0-44.9, adult ([place]); Obesity in pregnancy, antepartum; Carpal tunnel syndrome during pregnancy; Supervision of normal pregnancy, antepartum; Abnormal Papanicolaou smear of cervix with positive human papilloma virus (HPV) test; and Chronic hypertension affecting pregnancy on their problem list.  Patient reports no complaints.  Contractions: Irregular. Vag. Bleeding: None.  Movement: Present. Denies leaking of fluid.   The following portions of the patient's history were reviewed and updated as appropriate: allergies, current medications, past family history, past medical history, past social history, past surgical history and problem list.   Objective:   Vitals:   07/10/19 1445  BP: 124/82  Pulse: 99  Temp: 98.7 F (37.1 C)  Weight: 292 lb (132.5 kg)    Fetal Status:     Movement: Present     General:  Alert, oriented and cooperative. Patient is in no acute distress.  Skin: Skin is warm and dry. No rash noted.   Cardiovascular: Normal heart rate noted  Respiratory: Normal respiratory effort, no problems with respiration noted  Abdomen: Soft, gravid, appropriate for gestational age.  Pain/Pressure: Present     Pelvic: Cervical exam deferred        Extremities: Normal range of motion.  Edema: Trace  Mental Status: Normal mood and affect. Normal behavior. Normal judgment and thought content.   Assessment and Plan:  Pregnancy: G4P3003 at [redacted]w[redacted]d 1. Supervision of other normal pregnancy, antepartum occ ctx   2. Obesity in pregnancy, antepartum 07/07/2019  Est. FW:    3511   gm   7 lb 12 oz      91  %Est.   3. History of HSV ON Valtrex  4. Chronic hypertension affecting pregnancy On Labetalol 100mg  bid Scheduling IOL at 39 weeks  Cont weekly BPP until that time   5. GBS pos Needs atbx in labor.   Term labor symptoms and general obstetric precautions including but not limited to vaginal bleeding, contractions, leaking of fluid and fetal movement were reviewed in detail with the patient. Please refer to After Visit Summary for other counseling recommendations.   Return in about 2 weeks (around 07/24/2019).  No future appointments.  Lavonia Drafts, MD

## 2019-07-10 NOTE — Progress Notes (Signed)
Patient reports fetal movement with some irregular contractions. Pt states that previously she had elevated BP on babyscripts because she was outside walking and took BP as soon as she came inside. Pt states that she has been taking labetalol.

## 2019-07-12 ENCOUNTER — Telehealth (HOSPITAL_COMMUNITY): Payer: Self-pay | Admitting: *Deleted

## 2019-07-12 NOTE — Telephone Encounter (Signed)
Preadmission screen  

## 2019-07-13 ENCOUNTER — Ambulatory Visit (HOSPITAL_COMMUNITY)
Admission: RE | Admit: 2019-07-13 | Payer: Commercial Managed Care - PPO | Source: Ambulatory Visit | Attending: Obstetrics & Gynecology | Admitting: Obstetrics & Gynecology

## 2019-07-13 ENCOUNTER — Encounter: Payer: Self-pay | Admitting: Obstetrics & Gynecology

## 2019-07-13 NOTE — Telephone Encounter (Signed)
Preadmission screen  

## 2019-07-18 ENCOUNTER — Other Ambulatory Visit: Payer: Self-pay | Admitting: Advanced Practice Midwife

## 2019-07-20 ENCOUNTER — Other Ambulatory Visit: Payer: Self-pay

## 2019-07-20 ENCOUNTER — Other Ambulatory Visit (HOSPITAL_COMMUNITY)
Admission: RE | Admit: 2019-07-20 | Discharge: 2019-07-20 | Disposition: A | Discharge status: Home / Self Care | Payer: Commercial Managed Care - PPO | Source: Ambulatory Visit | Attending: Obstetrics & Gynecology | Admitting: Obstetrics & Gynecology

## 2019-07-20 DIAGNOSIS — Z20828 Contact with and (suspected) exposure to other viral communicable diseases: Secondary | ICD-10-CM | POA: Insufficient documentation

## 2019-07-20 LAB — SARS CORONAVIRUS 2 (TAT 6-24 HRS): SARS Coronavirus 2: NEGATIVE

## 2019-07-20 NOTE — MAU Note (Signed)
Asymptomatic, swab collected. 

## 2019-07-21 ENCOUNTER — Other Ambulatory Visit: Payer: Self-pay | Admitting: Advanced Practice Midwife

## 2019-07-21 NOTE — Addendum Note (Signed)
Addended by: Lavonia Drafts on: 07/21/2019 09:11 AM   Modules accepted: Orders, SmartSet

## 2019-07-22 ENCOUNTER — Other Ambulatory Visit: Payer: Self-pay

## 2019-07-22 ENCOUNTER — Other Ambulatory Visit: Payer: Self-pay | Admitting: Advanced Practice Midwife

## 2019-07-22 ENCOUNTER — Inpatient Hospital Stay (HOSPITAL_COMMUNITY)
Admission: AD | Admit: 2019-07-22 | Discharge: 2019-07-25 | DRG: 807 | Disposition: A | Discharge status: Home / Self Care | Payer: Commercial Managed Care - PPO | Attending: Obstetrics and Gynecology | Admitting: Obstetrics and Gynecology

## 2019-07-22 ENCOUNTER — Inpatient Hospital Stay (HOSPITAL_COMMUNITY): Payer: Commercial Managed Care - PPO

## 2019-07-22 ENCOUNTER — Encounter (HOSPITAL_COMMUNITY): Payer: Self-pay

## 2019-07-22 DIAGNOSIS — Z91040 Latex allergy status: Secondary | ICD-10-CM | POA: Diagnosis not present

## 2019-07-22 DIAGNOSIS — O99824 Streptococcus B carrier state complicating childbirth: Secondary | ICD-10-CM | POA: Diagnosis present

## 2019-07-22 DIAGNOSIS — O3663X0 Maternal care for excessive fetal growth, third trimester, not applicable or unspecified: Secondary | ICD-10-CM | POA: Diagnosis present

## 2019-07-22 DIAGNOSIS — Z87891 Personal history of nicotine dependence: Secondary | ICD-10-CM | POA: Diagnosis not present

## 2019-07-22 DIAGNOSIS — O1002 Pre-existing essential hypertension complicating childbirth: Secondary | ICD-10-CM | POA: Diagnosis present

## 2019-07-22 DIAGNOSIS — Z3A39 39 weeks gestation of pregnancy: Secondary | ICD-10-CM | POA: Diagnosis not present

## 2019-07-22 DIAGNOSIS — O99820 Streptococcus B carrier state complicating pregnancy: Secondary | ICD-10-CM

## 2019-07-22 DIAGNOSIS — O2641 Herpes gestationis, first trimester: Secondary | ICD-10-CM | POA: Diagnosis present

## 2019-07-22 DIAGNOSIS — E669 Obesity, unspecified: Secondary | ICD-10-CM | POA: Diagnosis present

## 2019-07-22 DIAGNOSIS — I10 Essential (primary) hypertension: Secondary | ICD-10-CM | POA: Diagnosis present

## 2019-07-22 DIAGNOSIS — O10013 Pre-existing essential hypertension complicating pregnancy, third trimester: Secondary | ICD-10-CM

## 2019-07-22 DIAGNOSIS — O99214 Obesity complicating childbirth: Secondary | ICD-10-CM | POA: Diagnosis present

## 2019-07-22 DIAGNOSIS — Z3480 Encounter for supervision of other normal pregnancy, unspecified trimester: Secondary | ICD-10-CM

## 2019-07-22 DIAGNOSIS — O10919 Unspecified pre-existing hypertension complicating pregnancy, unspecified trimester: Secondary | ICD-10-CM | POA: Diagnosis present

## 2019-07-22 LAB — CBC
HCT: 36 % (ref 36.0–46.0)
Hemoglobin: 12.1 g/dL (ref 12.0–15.0)
MCH: 29.7 pg (ref 26.0–34.0)
MCHC: 33.6 g/dL (ref 30.0–36.0)
MCV: 88.5 fL (ref 80.0–100.0)
Platelets: 267 10*3/uL (ref 150–400)
RBC: 4.07 MIL/uL (ref 3.87–5.11)
RDW: 13 % (ref 11.5–15.5)
WBC: 7.1 10*3/uL (ref 4.0–10.5)
nRBC: 0 % (ref 0.0–0.2)

## 2019-07-22 LAB — TYPE AND SCREEN
ABO/RH(D): O POS
Antibody Screen: NEGATIVE

## 2019-07-22 LAB — ABO/RH: ABO/RH(D): O POS

## 2019-07-22 MED ORDER — OXYTOCIN 40 UNITS IN NORMAL SALINE INFUSION - SIMPLE MED
2.5000 [IU]/h | INTRAVENOUS | Status: DC
  Filled 2019-07-22: qty 1000

## 2019-07-22 MED ORDER — OXYCODONE-ACETAMINOPHEN 5-325 MG PO TABS: 1.0000 | ORAL_TABLET | ORAL | Status: DC | PRN

## 2019-07-22 MED ORDER — PENICILLIN G 3 MILLION UNITS IVPB - SIMPLE MED
3.0000 10*6.[IU] | INTRAVENOUS | Status: DC
  Administered 2019-07-22 – 2019-07-23 (×4): 3 10*6.[IU] via INTRAVENOUS
  Filled 2019-07-22 (×4): qty 100

## 2019-07-22 MED ORDER — TERBUTALINE SULFATE 1 MG/ML IJ SOLN: 0.2500 mg | Freq: Once | INTRAMUSCULAR | Status: DC | PRN

## 2019-07-22 MED ORDER — LACTATED RINGERS IV SOLN
500.0000 mL | INTRAVENOUS | Status: DC | PRN
  Administered 2019-07-22: 500 mL via INTRAVENOUS

## 2019-07-22 MED ORDER — ACETAMINOPHEN 325 MG PO TABS: 650.0000 mg | ORAL_TABLET | ORAL | Status: DC | PRN

## 2019-07-22 MED ORDER — SODIUM CHLORIDE 0.9 % IV SOLN
5.0000 10*6.[IU] | Freq: Once | INTRAVENOUS | Status: AC
  Administered 2019-07-22: 5 10*6.[IU] via INTRAVENOUS
  Filled 2019-07-22: qty 5

## 2019-07-22 MED ORDER — BUTORPHANOL TARTRATE 1 MG/ML IJ SOLN: 1.0000 mg | INTRAMUSCULAR | Status: DC | PRN

## 2019-07-22 MED ORDER — SOD CITRATE-CITRIC ACID 500-334 MG/5ML PO SOLN: 30.0000 mL | ORAL | Status: DC | PRN

## 2019-07-22 MED ORDER — LACTATED RINGERS IV SOLN
INTRAVENOUS | Status: DC
  Administered 2019-07-22 (×2): 125 mL/h via INTRAVENOUS
  Administered 2019-07-22: 22:00:00 via INTRAVENOUS

## 2019-07-22 MED ORDER — OXYTOCIN BOLUS FROM INFUSION
500.0000 mL | Freq: Once | INTRAVENOUS | Status: AC
  Administered 2019-07-23: 04:00:00 500 mL via INTRAVENOUS

## 2019-07-22 MED ORDER — MISOPROSTOL 100 MCG PO TABS
25.0000 ug | ORAL_TABLET | ORAL | Status: DC
  Administered 2019-07-22 (×3): 25 ug via VAGINAL
  Filled 2019-07-22 (×3): qty 1

## 2019-07-22 MED ORDER — OXYCODONE-ACETAMINOPHEN 5-325 MG PO TABS: 2.0000 | ORAL_TABLET | ORAL | Status: DC | PRN

## 2019-07-22 MED ORDER — ONDANSETRON HCL 4 MG/2ML IJ SOLN: 4.0000 mg | Freq: Four times a day (QID) | INTRAMUSCULAR | Status: DC | PRN

## 2019-07-22 MED ORDER — LIDOCAINE HCL (PF) 1 % IJ SOLN: 30.0000 mL | INTRAMUSCULAR | Status: DC | PRN

## 2019-07-22 NOTE — Progress Notes (Signed)
LABOR PROGRESS NOTE  TRICIA PLEDGER is a 28 y.o. [phone] at [redacted]w[redacted]d  admitted for IOL d/t cHTN.  Subjective: She is sitting in bed, looking at her phone. Pain is 7/10 currently, does not feel like she needs pain meds yet. No other complaints.  Objective: BP (!) 149/84 (BP Location: Right Arm)   Pulse 86   Temp 98.6 F (37 C) (Oral)   Resp 16   Ht 5\' 4"  (1.626 m)   Wt 132.5 kg   LMP 10/10/2018   BMI 50.12 kg/m  or  Vitals:   07/22/19 1519 07/22/19 1529 07/22/19 1719 07/22/19 1853  BP: (!) 165/81 (!) 148/79 132/76 (!) 149/84  Pulse: 78 86 80 86  Resp: 16 16 16 16   Temp:   98.6 F (37 C)   TempSrc:   Oral   Weight:      Height:        Dilation: 1 Effacement (%): 50 Cervical Position: Posterior Station: Ballotable Presentation: Vertex Exam by:: Ileana Ladd RN FHT: baseline rate 140, moderate varibility, 15x15 acel, no decel, cat I Toco: 4 mins  Labs: Lab Results  Component Value Date   WBC 7.1 07/22/2019   HGB 12.1 07/22/2019   HCT 36.0 07/22/2019   MCV 88.5 07/22/2019   PLT 267 07/22/2019    Patient Active Problem List   Diagnosis Date Noted  . Chronic benign essential hypertension, antepartum 07/22/2019  . Group B Streptococcus carrier, antepartum 07/10/2019  . Chronic hypertension affecting pregnancy 01/22/2019  . Abnormal Papanicolaou smear of cervix with positive human papilloma virus (HPV) test 01/08/2019  . Supervision of normal pregnancy, antepartum 12/29/2018  . Carpal tunnel syndrome during pregnancy 08/24/2017  . History of HSV 04/14/2017  . BMI 40.0-44.9, adult ([place]) 04/14/2017  . Obesity in pregnancy, antepartum 04/14/2017  . Latex allergy 08/26/2011    Assessment / Plan: 28 y.o. G4P3003 at [redacted]w[redacted]d here for IOL d/t CHTN.  Labor: Cytotec placed at [place]. Consider balloon catheter placement at next cervical check. Cervical checks as indicated.  Fetal Wellbeing:  Cat I tracing, reassuring with no indication for intervention at this time. Pain  Control:  Maternally supported Anticipated MOD:  vaginal  Vanessa Kick [facility] 07/22/2019, 7:04 PM

## 2019-07-22 NOTE — Progress Notes (Signed)
LABOR PROGRESS NOTE  Tanya Moreno is a 28 y.o. [phone] at [redacted]w[redacted]d  admitted for IOL d/t cHTN.  Subjective: She is currently sitting in bed, looking at her phone. States pain is 8/10 currently but does not want pain meds right now. No other complaints at this time.  Objective: BP (!) 146/85 (BP Location: Left Arm)   Pulse 76   Temp 98.2 F (36.8 C) (Oral)   Resp 16   Ht 5\' 4"  (1.626 m)   Wt 132.5 kg   LMP 10/10/2018   BMI 50.12 kg/m  or  Vitals:   07/22/19 1030 07/22/19 1302 07/22/19 1303 07/22/19 1354  BP: 139/90  129/68 (!) 146/85  Pulse: 85  75 76  Resp: 16 16  16   Temp:  98.2 F (36.8 C)    TempSrc:  Oral    Weight:      Height:        Dilation: Fingertip Effacement (%): Thick Cervical Position: Posterior Station: Ballotable Presentation: Vertex Exam by:: Ileana Ladd RN FHT: baseline rate 140-145, moderate varibility, 15x15acel, no decel, cat I Toco: intermittent  Labs: Lab Results  Component Value Date   WBC 7.1 07/22/2019   HGB 12.1 07/22/2019   HCT 36.0 07/22/2019   MCV 88.5 07/22/2019   PLT 267 07/22/2019    Patient Active Problem List   Diagnosis Date Noted  . Chronic benign essential hypertension, antepartum 07/22/2019  . Group B Streptococcus carrier, antepartum 07/10/2019  . Chronic hypertension affecting pregnancy 01/22/2019  . Abnormal Papanicolaou smear of cervix with positive human papilloma virus (HPV) test 01/08/2019  . Supervision of normal pregnancy, antepartum 12/29/2018  . Carpal tunnel syndrome during pregnancy 08/24/2017  . History of HSV 04/14/2017  . BMI 40.0-44.9, adult ([place]) 04/14/2017  . Obesity in pregnancy, antepartum 04/14/2017  . Latex allergy 08/26/2011    Assessment / Plan: 28 y.o. [id number] at [redacted]w[redacted]d here for IOL d/t cHTN.  Labor: Cytotec placed at 1403. Continue Cytotect q4h. Cervical exams as indicated. Fetal Wellbeing:  Cat I tracing, reassuring with no indications for intervention at this time CHTN: most recent  BP 146/85. Asymptomatic currently. Continue to monitor. Pain Control:  Maternally supported. Consider pain meds/epidural if pt desires Anticipated MOD:  vaginal  Rosebud Student 07/22/2019, 2:27 PM

## 2019-07-22 NOTE — H&P (Signed)
Tanya Moreno is a 28 y.o. female [phone] @ 39.0wks by 10wk scan presenting for IOL due to [facility]. She is taking Labetalol 100mg  big. Denies H/A, N/V or visual disturbances. No leaking, bldg or RUQ pain. Her preg has been followed by the [facility] service since 11wks and has been remarkable for:  # cHTN (Lab 100bid) # LGA infant 91st% at 37wk (7+12) # hx HSV # nl Pap with +HPV # obesity  OB History    Gravida  4   Para  3   Term  3   Preterm      AB      Living  3     SAB      TAB      Ectopic      Multiple  0   Live Births  3          Past Medical History:  Diagnosis Date  . Chlamydia   . Chlamydia infection affecting pregnancy in first trimester 02/09/2017   Positive on 02/08/17, azithromycin 1000 mg PO x 1 dose sent to pt pharmacy and pt notified by phone by MAU staff Positive on 7/30  . Genital HSV    Last outbreak Nov 2013  . Gonorrhea   . Hypertension   . Trichomonas    Past Surgical History:  Procedure Laterality Date  . NO PAST SURGERIES     Family History: family history is not on file. Social History:  reports that she quit smoking about 5 years ago. She quit smokeless tobacco use about 4 years ago. She reports that she does not drink alcohol or use drugs.     Maternal Diabetes: No Genetic Screening: Normal Maternal Ultrasounds/Referrals: Normal Fetal Ultrasounds or other Referrals:  None Maternal Substance Abuse:  No Significant Maternal Medications:  Meds include: Other: Labetalol Significant Maternal Lab Results:  Group B Strep positive Other Comments:  None  ROS History Dilation: Fingertip Effacement (%): Thick Station: Ballotable Exam by:: J Middleton RN Blood pressure (!) 141/92, pulse 97, temperature 98.5 F (36.9 C), temperature source Oral, resp. rate 18, height 5\' 4"  (1.626 m), weight 132.5 kg, last menstrual period 10/10/2018, unknown if currently breastfeeding. Exam Physical Exam  Prenatal labs: ABO, Rh: --/--/PENDING  (08/15 0818) Antibody: PENDING (08/15 0818) Rubella: 1.70 (01/24 1146) RPR: Non Reactive (06/09 1101)  HBsAg: Negative (01/24 1146)  HIV: Non Reactive (06/09 1101)  GBS: Positive (07/23 1016)   Assessment/Plan: IUP@39 .0wks cHTN GBS pos  Admit to Labor and Delivery Start with cytotec and attempt foley when able. Vertex confirmed with bedside u/s. PCN for prophylaxis    [place] Day 07/22/2019, 9:00 AM

## 2019-07-22 NOTE — Progress Notes (Signed)
Patient ID: Tanya Moreno, female   DOB: [birth year], 28 y.o.   MRN: [id number] Doing well  Vitals:   07/22/19 1853 07/22/19 1922 07/22/19 1932 07/22/19 2030  BP: (!) 149/84 (!) 153/71 140/77 134/79  Pulse: 86 74 82 78  Resp: 16 18 18 20   Temp:  98.7 F (37.1 C)    TempSrc:  Oral    Weight:      Height:       FHR reactive UCs irregular  Dilation: 2 Effacement (%): 70 Cervical Position: Posterior Station: -1 Presentation: Vertex Exam by:: Carmelia Roller CNM  Foley inserted and balloon inflated Will continue to observe

## 2019-07-23 ENCOUNTER — Encounter (HOSPITAL_COMMUNITY): Payer: Self-pay

## 2019-07-23 DIAGNOSIS — O1002 Pre-existing essential hypertension complicating childbirth: Secondary | ICD-10-CM

## 2019-07-23 DIAGNOSIS — O99824 Streptococcus B carrier state complicating childbirth: Secondary | ICD-10-CM

## 2019-07-23 DIAGNOSIS — Z3A39 39 weeks gestation of pregnancy: Secondary | ICD-10-CM

## 2019-07-23 LAB — RPR: RPR Ser Ql: NONREACTIVE

## 2019-07-23 MED ORDER — FENTANYL CITRATE (PF) 100 MCG/2ML IJ SOLN
100.0000 ug | INTRAMUSCULAR | Status: DC | PRN
  Administered 2019-07-23: 100 ug via INTRAVENOUS

## 2019-07-23 MED ORDER — SENNOSIDES-DOCUSATE SODIUM 8.6-50 MG PO TABS
2.0000 | ORAL_TABLET | ORAL | Status: DC
  Filled 2019-07-23: qty 2

## 2019-07-23 MED ORDER — BENZOCAINE-MENTHOL 20-0.5 % EX AERO: 1.0000 "application " | INHALATION_SPRAY | CUTANEOUS | Status: DC | PRN

## 2019-07-23 MED ORDER — ONDANSETRON HCL 4 MG/2ML IJ SOLN: 4.0000 mg | INTRAMUSCULAR | Status: DC | PRN

## 2019-07-23 MED ORDER — FENTANYL CITRATE (PF) 100 MCG/2ML IJ SOLN
INTRAMUSCULAR | Status: AC
  Filled 2019-07-23: qty 2

## 2019-07-23 MED ORDER — ONDANSETRON HCL 4 MG PO TABS: 4.0000 mg | ORAL_TABLET | ORAL | Status: DC | PRN

## 2019-07-23 MED ORDER — IBUPROFEN 600 MG PO TABS
600.0000 mg | ORAL_TABLET | Freq: Four times a day (QID) | ORAL | Status: DC
  Filled 2019-07-23 (×3): qty 1

## 2019-07-23 MED ORDER — COCONUT OIL OIL: 1.0000 "application " | TOPICAL_OIL | Status: DC | PRN

## 2019-07-23 MED ORDER — TETANUS-DIPHTH-ACELL PERTUSSIS 5-2.5-18.5 LF-MCG/0.5 IM SUSP: 0.5000 mL | Freq: Once | INTRAMUSCULAR | Status: DC

## 2019-07-23 MED ORDER — ACETAMINOPHEN 325 MG PO TABS: 650.0000 mg | ORAL_TABLET | ORAL | Status: DC | PRN

## 2019-07-23 MED ORDER — SIMETHICONE 80 MG PO CHEW: 80.0000 mg | CHEWABLE_TABLET | ORAL | Status: DC | PRN

## 2019-07-23 MED ORDER — WITCH HAZEL-GLYCERIN EX PADS: 1.0000 "application " | MEDICATED_PAD | CUTANEOUS | Status: DC | PRN

## 2019-07-23 MED ORDER — DIBUCAINE (PERIANAL) 1 % EX OINT: 1.0000 "application " | TOPICAL_OINTMENT | CUTANEOUS | Status: DC | PRN

## 2019-07-23 MED ORDER — ZOLPIDEM TARTRATE 5 MG PO TABS: 5.0000 mg | ORAL_TABLET | Freq: Every evening | ORAL | Status: DC | PRN

## 2019-07-23 MED ORDER — DIPHENHYDRAMINE HCL 25 MG PO CAPS: 25.0000 mg | ORAL_CAPSULE | Freq: Four times a day (QID) | ORAL | Status: DC | PRN

## 2019-07-23 MED ORDER — FENTANYL CITRATE (PF) 100 MCG/2ML IJ SOLN: 100.0000 ug | INTRAMUSCULAR | Status: DC | PRN

## 2019-07-23 MED ORDER — PRENATAL MULTIVITAMIN CH
1.0000 | ORAL_TABLET | Freq: Every day | ORAL | Status: DC
  Filled 2019-07-23 (×2): qty 1

## 2019-07-23 MED ORDER — LABETALOL HCL 100 MG PO TABS
100.0000 mg | ORAL_TABLET | Freq: Two times a day (BID) | ORAL | Status: DC
  Administered 2019-07-23 – 2019-07-25 (×5): 100 mg via ORAL
  Filled 2019-07-23 (×5): qty 1

## 2019-07-23 NOTE — Progress Notes (Signed)
Patient ID: Tanya Moreno, female   DOB: Apr 03, 1991, 28 y.o.   MRN: [id number] Late entry for 2310  Foley came out Tolerating pain well  Vitals:   07/22/19 2030 07/22/19 2130 07/22/19 2240 07/22/19 2350  BP: 134/79 (!) 120/57 139/67 136/78  Pulse: 78 71 65 73  Resp: 20 20 18 20   Temp:    98.8 F (37.1 C)  TempSrc:    Oral  Weight:      Height:       Dilation: 5 Effacement (%): 50 Cervical Position: Posterior Station: -2 Presentation: Vertex Exam by:: R Craft RN  Laboring spontaneously  Will continue to observe

## 2019-07-23 NOTE — Discharge Summary (Signed)
Postpartum Discharge Summary     Patient Name: Tanya Moreno DOB: 1991-10-18 MRN: [id number]  Date of admission: 07/22/2019 Delivering Provider: Seabron Spates   Date of discharge: 07/25/2019  Admitting diagnosis: pregnancy Intrauterine pregnancy: [redacted]w[redacted]d     Secondary diagnosis:  Active Problems:   History of HSV   Chronic hypertension affecting pregnancy   Group B Streptococcus carrier, antepartum   Chronic benign essential hypertension, antepartum  Additional problems: none     Discharge diagnosis: Term Pregnancy Delivered and CHTN                                                                                                Post partum procedures:none  Augmentation: Cytotec and Foley Balloon  Complications: None  Hospital course:  Induction of Labor With Vaginal Delivery   28 y.o. yo [phone] at [redacted]w[redacted]d was admitted to the hospital 07/22/2019 for induction of labor.  Indication for induction: Chronic Hypertension.  Patient had an uncomplicated labor course as follows: Cytotec used for ripening Foley inserted and came out within 2 hrs. Proceeded in labor on her own.  Normal SVd, no shoulder dystocia Membrane Rupture Time/Date: 12:50 AM ,07/23/2019   Intrapartum Procedures: Episiotomy: None [1]                                         Lacerations:  None [1]  Patient had delivery of a Viable infant.  Information for the patient's newborn:  Essense, Bousquet [[id number]]  Delivery Method: Vag-Spont    07/23/2019  Details of delivery can be found in separate delivery note.  Patient had a routine postpartum course. Patient is discharged home 07/25/19.  Magnesium Sulfate recieved: No BMZ received: No  Physical exam  Vitals:   07/24/19 0912 07/24/19 1508 07/24/19 2206 07/25/19 0615  BP: 140/75 133/85 134/85 131/71  Pulse: 80 67 76 72  Resp:  18 18 17   Temp:  99 F (37.2 C) 98.5 F (36.9 C) 98 F (36.7 C)  TempSrc:  Oral Oral Oral  SpO2:   100% 99%  Weight:       Height:       General: alert, cooperative, no distress Lochia: appropriate Uterine Fundus: firm Incision: N/A DVT Evaluation: No evidence of DVT seen on physical exam. No significant calf/ankle edema. Labs: Lab Results  Component Value Date   WBC 7.1 07/22/2019   HGB 12.1 07/22/2019   HCT 36.0 07/22/2019   MCV 88.5 07/22/2019   PLT 267 07/22/2019   CMP Latest Ref Rng & Units 05/16/2019  Glucose 65 - 99 mg/dL 110(H)  BUN 6 - 20 mg/dL -  Creatinine 0.44 - 1.00 mg/dL -  Sodium 135 - 145 mmol/L -  Potassium 3.5 - 5.1 mmol/L -  Chloride 98 - 111 mmol/L -  CO2 22 - 32 mmol/L -  Calcium 8.9 - 10.3 mg/dL -  Total Protein 6.5 - 8.1 g/dL -  Total Bilirubin 0.3 - 1.2 mg/dL -  Alkaline Phos 38 - 126 U/L -  AST 15 - 41 U/L -  ALT 0 - 44 U/L -    Discharge instruction: per After Visit Summary and "Baby and Me Booklet".  After visit meds:  Allergies as of 07/25/2019      Reactions   Latex Swelling   Mainly latex condoms   Tape Itching      Medication List    STOP taking these medications   aspirin EC 81 MG tablet   labetalol 100 MG tablet Commonly known as: NORMODYNE   metroNIDAZOLE 500 MG tablet Commonly known as: FLAGYL     TAKE these medications   doxylamine (Sleep) 25 MG tablet Commonly known as: Unisom Take 1 tablet (25 mg total) by mouth at bedtime as needed.   fluticasone 50 MCG/ACT nasal spray Commonly known as: FLONASE Place 2 sprays into both nostrils daily.   ibuprofen 600 MG tablet Commonly known as: ADVIL Take 1 tablet (600 mg total) by mouth every 6 (six) hours as needed.   ipratropium 0.06 % nasal spray Commonly known as: Atrovent Place 2 sprays into both nostrils 4 (four) times daily.   valACYclovir 500 MG tablet Commonly known as: VALTREX Take 1 tablet (500 mg total) by mouth 2 (two) times daily.   Vitafol Gummies 3.33-0.333-34.8 MG Chew Chew 1 tablet by mouth daily.       Diet: routine diet  Activity: Advance as tolerated. Pelvic  rest for 6 weeks.   Outpatient follow up:4 weeks with BP check in 1 week  Follow up Appt: Future Appointments  Date Time Provider Department Center  08/01/2019 11:30 AM [facility] NURSE [facility] None  08/28/2019  2:30 PM Anyanwu, Jethro BastosUgonna A, MD [facility] None   Follow up Visit: Follow-up Information    [facility] Go in 4 week(s).   Specialty: Obstetrics and Gynecology Why: Follow up as scheduled for blood pressure check and postpartum appointment  Contact information: [address], [address] [place] [id number] [phone]          [facility] Please schedule this patient for Postpartum visit in: 1 week with the following provider: Any provider For C/S patients schedule nurse incision check in weeks 2 weeks: no High risk pregnancy complicated by: HTN Delivery mode:  SVD Anticipated Birth Control:  Depo PP Procedures needed: BP check  Schedule Integrated BH visit: no  Newborn Data: Live born female  Birth Weight:  3980g APGAR: 8, 9  Newborn Delivery   Birth date/time: 07/23/2019 04:22:00 Delivery type: Vaginal, Spontaneous      Baby Feeding: Bottle Disposition:home with mother   07/25/2019 Sharyon CableVeronica C [place], CNM

## 2019-07-23 NOTE — Progress Notes (Signed)
Patient ID: Tanya Moreno, female   DOB: 07/04/1991, 28 y.o.   MRN: [id number] Feeling pressure SROM at 0050hrs  Vitals:   07/22/19 2350 07/23/19 0035 07/23/19 0110 07/23/19 0220  BP: 136/78 (!) 145/87 127/76 135/73  Pulse: 73 76 71 78  Resp: 20 20 20 18   Temp: 98.8 F (37.1 C)  98.6 F (37 C)   TempSrc: Oral  Oral   Weight:      Height:       FHR reassuring UCs every 2-30min  Dilation: 6.5 Effacement (%): 80 Cervical Position: Posterior Station: -2, -1 Presentation: Vertex Exam by:: Carmelia Roller CNM   Anticipate delivery soon

## 2019-07-23 NOTE — Progress Notes (Signed)
Patient ID: Tanya Moreno, female   DOB: 10-Feb-1991, 28 y.o.   MRN: [id number] Just had SROM clear fluid  Vitals:   07/22/19 2130 07/22/19 2240 07/22/19 2350 07/23/19 0035  BP: (!) 120/57 139/67 136/78 (!) 145/87  Pulse: 71 65 73 76  Resp: 20 18 20 20   Temp:   98.8 F (37.1 C)   TempSrc:   Oral   Weight:      Height:       FHR reactive, category I UCs every 2 min  Dilation: 5 Effacement (%): 50 Cervical Position: Posterior Station: -2 Presentation: Vertex Exam by:: R Craft RN  Anticipate SVD soon

## 2019-07-23 NOTE — Progress Notes (Signed)
Patient ID: Tanya Moreno, female   DOB: 1991/03/24, 28 y.o.   MRN: [id number] Feeling pressure with frequent UCs  Vitals:   07/23/19 0035 07/23/19 0110 07/23/19 0220 07/23/19 0303  BP: (!) 145/87 127/76 135/73 (!) 127/56  Pulse: 76 71 78 79  Resp: 20 20 18 20   Temp:  98.6 F (37 C)  98.6 F (37 C)  TempSrc:  Oral  Oral  Weight:      Height:       FHR stable UCs q 82mn  Dilation: 8 Effacement (%): 100 Cervical Position: Posterior Station: -2 Presentation: Vertex Exam by:: Carmelia Roller CNM   Will await second stage

## 2019-07-24 ENCOUNTER — Other Ambulatory Visit: Payer: Self-pay

## 2019-07-24 NOTE — Progress Notes (Signed)
POSTPARTUM PROGRESS NOTE  Post Partum Day 1  Subjective:  Tanya Moreno is a 28 y.o. [id number] s/p SVD at [redacted]w[redacted]d.  She reports she is doing well. No acute events overnight. She denies any problems with ambulating, voiding or po intake. Denies nausea or vomiting.  Pain is well controlled.  Lochia is appropriate.  Objective: Blood pressure (!) 105/51, pulse 64, temperature 97.6 F (36.4 C), temperature source Oral, resp. rate 20, height 5\' 4"  (1.626 m), weight 132.5 kg, last menstrual period 10/10/2018, SpO2 100 %, unknown if currently breastfeeding.  Physical Exam:  General: alert, cooperative and no distress Chest: no respiratory distress Heart:regular rate, distal pulses intact Abdomen: soft, nontender,  Uterine Fundus: firm, appropriately tender DVT Evaluation: No calf swelling or tenderness Extremities: No LE edema Skin: warm, dry  Recent Labs    07/22/19 0818  HGB 12.1  HCT 36.0    Assessment/Plan: Tanya Moreno is a 28 y.o. H7C1638 s/p SVD at [redacted]w[redacted]d   PPD#1 - Doing well  Routine postpartum care CHTN: BPs normotensive. Continue to monitor.  Contraception: Depo  Feeding: Bottle  Dispo: Plan for discharge PPD#2. Baby on phototherapy.    LOS: 2 days   Phill Myron, D.O. OB Fellow  07/24/2019, 7:30 AM

## 2019-07-25 ENCOUNTER — Telehealth: Payer: Commercial Managed Care - PPO | Admitting: Obstetrics and Gynecology

## 2019-07-25 MED ORDER — IBUPROFEN 600 MG PO TABS: 600.0000 mg | ORAL_TABLET | Freq: Four times a day (QID) | ORAL | 0 refills | Status: DC | PRN

## 2019-07-25 MED ORDER — MEDROXYPROGESTERONE ACETATE 150 MG/ML IM SUSP
150.0000 mg | Freq: Once | INTRAMUSCULAR | Status: AC
  Administered 2019-07-25: 150 mg via INTRAMUSCULAR
  Filled 2019-07-25: qty 1

## 2019-08-28 ENCOUNTER — Other Ambulatory Visit: Payer: Self-pay

## 2019-08-28 ENCOUNTER — Ambulatory Visit (INDEPENDENT_AMBULATORY_CARE_PROVIDER_SITE_OTHER): Payer: Commercial Managed Care - PPO | Admitting: Obstetrics & Gynecology

## 2019-08-28 ENCOUNTER — Encounter: Payer: Self-pay | Admitting: Obstetrics & Gynecology

## 2019-08-28 DIAGNOSIS — I10 Essential (primary) hypertension: Secondary | ICD-10-CM

## 2019-08-28 DIAGNOSIS — O2641 Herpes gestationis, first trimester: Secondary | ICD-10-CM

## 2019-08-28 DIAGNOSIS — Z1389 Encounter for screening for other disorder: Secondary | ICD-10-CM | POA: Diagnosis not present

## 2019-08-28 MED ORDER — LABETALOL HCL 200 MG PO TABS: 200.0000 mg | ORAL_TABLET | Freq: Every day | ORAL | 1 refills | Status: DC

## 2019-08-28 NOTE — Patient Instructions (Addendum)
Return to clinic for any scheduled appointments or for any gynecologic concerns as needed.    [place] [address], [place], [place] [address] [phone]

## 2019-08-28 NOTE — Progress Notes (Signed)
Subjective:     Tanya Moreno is a 28 y.o. [phone] female who presents for a postpartum visit. She is 4 weeks postpartum following a spontaneous vaginal delivery. I have fully reviewed the prenatal and intrapartum course; patient had CHTN controlled on Labetalol. The delivery was at 39.0 gestational weeks. Outcome: spontaneous vaginal delivery. Anesthesia: none. Postpartum course has been unremarkable. Baby's course has been unremarkable Baby is feeding by bottle - Jerlyn Ly Start. Bleeding no bleeding. Bowel function is normal. Bladder function is normal. Patient is not sexually active. Contraception method is Depo-Provera injections, received one in hospital. Postpartum depression screening: negative=6  The following portions of the patient's history were reviewed and updated as appropriate: allergies, current medications, past family history, past medical history, past social history, past surgical history and problem list. Normal pap with positive HPV in 12/2018.  Review of Systems Pertinent items noted in HPI and remainder of comprehensive ROS otherwise negative.   Objective:    BP (!) 149/97   Pulse (!) 54   Wt 272 lb (123.4 kg)   BMI 46.69 kg/m   General:  alert and no distress   Breasts:  deferred  Lungs: clear to auscultation bilaterally  Heart:  regular rate and rhythm  Abdomen: soft, non-tender; bowel sounds normal; no masses,  no organomegaly  Pelvic:  not evaluated        Assessment:     Elevated BP noted today, otherwise normal postpartum exam .   Plan:    1. Contraception: Depo-Provera injections, received last one on 07/25/19  2. Increased Labetalol to 200 mg daily, also referred to [facility] for further management 3. Follow up in 11/20 for next Depo Provera or as needed.     Verita Schneiders, MD, [place] for [employer], [place]

## 2019-09-30 ENCOUNTER — Other Ambulatory Visit: Payer: Self-pay

## 2019-09-30 ENCOUNTER — Emergency Department (HOSPITAL_COMMUNITY)
Admission: EM | Admit: 2019-09-30 | Discharge: 2019-09-30 | Disposition: A | Discharge status: Home / Self Care | Payer: Commercial Managed Care - PPO | Attending: Emergency Medicine | Admitting: Emergency Medicine

## 2019-09-30 ENCOUNTER — Encounter (HOSPITAL_COMMUNITY): Payer: Self-pay | Admitting: Emergency Medicine

## 2019-09-30 DIAGNOSIS — Z91040 Latex allergy status: Secondary | ICD-10-CM | POA: Insufficient documentation

## 2019-09-30 DIAGNOSIS — Z79899 Other long term (current) drug therapy: Secondary | ICD-10-CM | POA: Insufficient documentation

## 2019-09-30 DIAGNOSIS — I10 Essential (primary) hypertension: Secondary | ICD-10-CM | POA: Insufficient documentation

## 2019-09-30 DIAGNOSIS — Z87891 Personal history of nicotine dependence: Secondary | ICD-10-CM | POA: Diagnosis not present

## 2019-09-30 DIAGNOSIS — T7840XA Allergy, unspecified, initial encounter: Secondary | ICD-10-CM | POA: Insufficient documentation

## 2019-09-30 DIAGNOSIS — R220 Localized swelling, mass and lump, head: Secondary | ICD-10-CM | POA: Diagnosis present

## 2019-09-30 MED ORDER — DEXAMETHASONE SODIUM PHOSPHATE 10 MG/ML IJ SOLN
10.0000 mg | Freq: Once | INTRAMUSCULAR | Status: AC
  Administered 2019-09-30: 08:00:00 10 mg via INTRAMUSCULAR
  Filled 2019-09-30: qty 1

## 2019-09-30 MED ORDER — DIPHENHYDRAMINE HCL 25 MG PO CAPS
25.0000 mg | ORAL_CAPSULE | Freq: Once | ORAL | Status: AC
  Administered 2019-09-30: 08:00:00 25 mg via ORAL
  Filled 2019-09-30: qty 1

## 2019-09-30 MED ORDER — PREDNISONE 10 MG (21) PO TBPK: ORAL_TABLET | Freq: Every day | ORAL | 0 refills | Status: DC

## 2019-09-30 MED ORDER — FAMOTIDINE 20 MG PO TABS
40.0000 mg | ORAL_TABLET | Freq: Once | ORAL | Status: AC
  Administered 2019-09-30: 40 mg via ORAL
  Filled 2019-09-30: qty 2

## 2019-09-30 MED ORDER — EPINEPHRINE 0.3 MG/0.3ML IJ SOAJ: 0.3000 mg | INTRAMUSCULAR | 0 refills | Status: DC | PRN

## 2019-09-30 NOTE — ED Provider Notes (Signed)
[facility] Provider Note   CSN: [id number] Arrival date & time: 09/30/19  0736     History   Chief Complaint Chief Complaint  Patient presents with  . Allergic Reaction    HPI [name] is a 28 y.o. female presents to the ER for "allergic reaction".  Describes upper and lower eyelid bilateral swelling that began around 9 PM last night.  She used any face wash and thinks that is what caused it.  She had to work this morning so she slept through it and woke up today and noticed worsening eyelid swelling so came to the ER for evaluation.  No interventions.  No alleviating or aggravating factors.  Denies any other symptoms including eye pain, eye discharge, lip tongue or throat swelling or tightness, chest pain, shortness of breath, vomiting, diarrhea, abdominal pain, rash.  No previous known history of allergies.  No recent medications changes.     HPI  Past Medical History:  Diagnosis Date  . Chlamydia   . Chlamydia infection affecting pregnancy in first trimester 02/09/2017   Positive on 02/08/17, azithromycin 1000 mg PO x 1 dose sent to pt pharmacy and pt notified by phone by MAU staff Positive on 7/30  . Genital HSV    Last outbreak Nov 2013  . Gonorrhea   . Hypertension   . Trichomonas     Patient Active Problem List   Diagnosis Date Noted  . Chronic hypertension 07/22/2019  . Abnormal Papanicolaou smear of cervix with positive human papilloma virus (HPV) test on 12/30/18 01/08/2019  . Carpal tunnel syndrome during pregnancy 08/24/2017  . History of HSV 04/14/2017  . BMI 40.0-44.9, adult ([facility]) 04/14/2017  . Latex allergy 08/26/2011    Past Surgical History:  Procedure Laterality Date  . NO PAST SURGERIES       OB History    Gravida  4   Para  4   Term  4   Preterm      AB      Living  4     SAB      TAB      Ectopic      Multiple  0   Live Births  4            Home Medications    Prior to  Admission medications   Medication Sig Start Date End Date Taking? Authorizing Provider  EPINEPHrine 0.3 mg/0.3 mL IJ SOAJ injection Inject 0.3 mLs (0.3 mg total) into the muscle as needed for anaphylaxis. 09/30/19   Liberty HandyGibbons, Mareena Cavan J, PA-C  labetalol (NORMODYNE) 200 MG tablet Take 1 tablet (200 mg total) by mouth daily. 08/28/19   Anyanwu, Jethro BastosUgonna A, MD  predniSONE (STERAPRED UNI-PAK 21 TAB) 10 MG (21) TBPK tablet Take by mouth daily. Take 6 tabs by mouth daily  for 2 days, then 5 tabs for 2 days, then 4 tabs for 2 days, then 3 tabs for 2 days, 2 tabs for 2 days, then 1 tab by mouth daily for 2 days 09/30/19   Liberty HandyGibbons, Revecca Nachtigal J, PA-C  valACYclovir (VALTREX) 500 MG tablet Take 1 tablet (500 mg total) by mouth 2 (two) times daily. Patient not taking: Reported on 08/28/2019 06/29/19   [facility] StaggersErvin, Michael L, MD    Family History No family history on file.  Social History Social History   Tobacco Use  . Smoking status: Former Smoker    Quit date: 2015    Years since quitting: 5.8  .  Smokeless tobacco: Former Neurosurgeon    Quit date: 11/10/2014  Substance Use Topics  . Alcohol use: No    Alcohol/week: 0.0 standard drinks  . Drug use: No     Allergies   Latex and Tape   Review of Systems Review of Systems  Eyes:       Eyelid swelling  All other systems reviewed and are negative.    Physical Exam Updated Vital Signs BP (!) 161/107 (BP Location: Left Arm)   Pulse 70   Temp 98.4 F (36.9 C) (Oral)   Resp 20   SpO2 97%   Physical Exam Vitals signs and nursing note reviewed.  Constitutional:      Appearance: She is well-developed.     Comments: Non toxic in NAD  HENT:     Head: Normocephalic and atraumatic.     Nose: Nose normal.     Mouth/Throat:     Mouth: Mucous membranes are moist.     Comments: No obvious lip, tongue or oropharyngeal edema.  Uvula is midline.  Tonsils and oropharynx visualized without edema or other abnormalities. Eyes:     Conjunctiva/sclera: Conjunctivae  normal.     Comments: Minimally, nontender, symmetric lower eyelid edema without erythema, fluctuance, lesions.  Patient can fully open eyes and EOMs are intact, painless.  PERRL.  Neck:     Musculoskeletal: Normal range of motion.  Cardiovascular:     Rate and Rhythm: Normal rate and regular rhythm.  Pulmonary:     Effort: Pulmonary effort is normal.     Breath sounds: Normal breath sounds.  Musculoskeletal: Normal range of motion.  Skin:    General: Skin is warm and dry.     Capillary Refill: Capillary refill takes less than 2 seconds.  Neurological:     Mental Status: She is alert.  Psychiatric:        Behavior: Behavior normal.      ED Treatments / Results  Labs (all labs ordered are listed, but only abnormal results are displayed) Labs Reviewed - No data to display  EKG None  Radiology No results found.  Procedures Procedures (including critical care time)  Medications Ordered in ED Medications  dexamethasone (DECADRON) injection 10 mg (10 mg Intramuscular Given 09/30/19 0825)  diphenhydrAMINE (BENADRYL) capsule 25 mg (25 mg Oral Given 09/30/19 0826)  famotidine (PEPCID) tablet 40 mg (40 mg Oral Given 09/30/19 0826)     Initial Impression / Assessment and Plan / ED Course  I have reviewed the triage vital signs and the nursing notes.  Pertinent labs & imaging results that were available during my care of the patient were reviewed by me and considered in my medical decision making (see chart for details).  Presentation most consistent with mild allergic reaction with only eye edema.  No concerning signs of severe reaction. No respiratory distress. No CV compromise.  HD stable. No oropharyngeal angioedema.  No signs of anaphylaxis.  No indication for epi.  She used a new face wash.     Discussed with patient that symptoms are likely from a mild allergic reaction and she has no signs or symptoms to suggest severe reaction or anaphylaxis.  Onset of symptoms at 9  p.m. last night without clinical progression or worsening decline.    Explained to her standard your treatment includes IV fluid, famotidine, Benadryl, steroids and reassessment however given her very mild symptoms greater than 12 hours I think it is also reasonable to consider oral agents.  Patient preferred to do  oral agents be discharged home.  I think this is reasonable.    We will give oral famotidine, Benadryl and IM Decadron.    We will discharge with prescriptions of these medicines and steroid taper with EpiPen. Understands possibility of biphasic or relapsing reaction as well and symptoms that would warrant return to ER.  She is comfortable with this.    Final Clinical Impressions(s) / ED Diagnoses   Final diagnoses:  Allergic reaction, initial encounter    ED Discharge Orders         Ordered    predniSONE (STERAPRED UNI-PAK 21 TAB) 10 MG (21) TBPK tablet  Daily     09/30/19 0835    EPINEPHrine 0.3 mg/0.3 mL IJ SOAJ injection  As needed     09/30/19 4967           Kinnie Feil, PA-C 09/30/19 5916    Charlesetta Shanks, MD 09/30/19 1326

## 2019-09-30 NOTE — Discharge Instructions (Addendum)
You were seen in the ER for eyelid swelling.  We discussed her symptoms are likely from a mild allergic reaction.  We discussed option to treat with IV medicines or oral agents and you opted for oral medications.  Take 20 mg of famotidine every 24 hours, 25 mg of diphenhydramine (Benadryl) every 8 hours for the next 5 days.  Finish prednisone taper.  I have given you prescription for EpiPen that you should use for severe symptoms of allergic reaction or anaphylaxis including sudden severe lip, tongue, throat swelling or tightness, shortness of breath, chest pain.  Return to the ER immediately if you develop severe symptoms of allergic reaction or use your EpiPen.

## 2019-09-30 NOTE — ED Triage Notes (Signed)
C/o swelling to bilateral eyes since last night.  Denies known allergies.  Denies SOB.

## 2019-10-05 ENCOUNTER — Encounter: Payer: Self-pay | Admitting: Obstetrics

## 2019-10-16 ENCOUNTER — Ambulatory Visit: Payer: Medicaid Other

## 2019-10-23 ENCOUNTER — Other Ambulatory Visit: Payer: Self-pay | Admitting: *Deleted

## 2019-10-23 ENCOUNTER — Ambulatory Visit: Payer: Medicaid Other

## 2019-10-23 MED ORDER — MEDROXYPROGESTERONE ACETATE 150 MG/ML IM SUSP: 150.0000 mg | INTRAMUSCULAR | 2 refills | Status: DC

## 2019-10-23 NOTE — Progress Notes (Signed)
Depo refill sent to pharmacy, pt has appt tomorrow.

## 2019-10-24 ENCOUNTER — Ambulatory Visit: Payer: Medicaid Other

## 2019-10-25 ENCOUNTER — Ambulatory Visit (INDEPENDENT_AMBULATORY_CARE_PROVIDER_SITE_OTHER): Payer: Commercial Managed Care - PPO

## 2019-10-25 ENCOUNTER — Other Ambulatory Visit: Payer: Self-pay

## 2019-10-25 DIAGNOSIS — Z3042 Encounter for surveillance of injectable contraceptive: Secondary | ICD-10-CM | POA: Diagnosis not present

## 2019-10-25 MED ORDER — MEDROXYPROGESTERONE ACETATE 150 MG/ML IM SUSP
150.0000 mg | Freq: Once | INTRAMUSCULAR | Status: AC
  Administered 2019-10-25: 16:00:00 150 mg via INTRAMUSCULAR

## 2019-10-25 NOTE — Progress Notes (Signed)
Pt is here for a depo injection.  Pt tolerated injection well in the LD with complications. Pt advised to make next appointment around 3 months. -EH/RMA

## 2020-01-17 ENCOUNTER — Other Ambulatory Visit: Payer: Self-pay

## 2020-01-17 ENCOUNTER — Ambulatory Visit: Payer: Medicaid Other

## 2020-01-18 ENCOUNTER — Ambulatory Visit: Payer: Medicaid Other

## 2020-01-24 ENCOUNTER — Ambulatory Visit: Payer: Medicaid Other

## 2020-01-25 ENCOUNTER — Ambulatory Visit: Payer: Medicaid Other

## 2020-02-05 ENCOUNTER — Other Ambulatory Visit: Payer: Self-pay

## 2020-02-05 ENCOUNTER — Ambulatory Visit (INDEPENDENT_AMBULATORY_CARE_PROVIDER_SITE_OTHER): Payer: Commercial Managed Care - PPO | Admitting: *Deleted

## 2020-02-05 VITALS — Wt 292.0 lb

## 2020-02-05 DIAGNOSIS — Z3042 Encounter for surveillance of injectable contraceptive: Secondary | ICD-10-CM | POA: Diagnosis not present

## 2020-02-05 MED ORDER — MEDROXYPROGESTERONE ACETATE 150 MG/ML IM SUSP
150.0000 mg | Freq: Once | INTRAMUSCULAR | Status: AC
  Administered 2020-02-05: 11:00:00 150 mg via INTRAMUSCULAR

## 2020-02-05 NOTE — Progress Notes (Signed)
Pt is in office for Depo injection.   Depo was given per office policy.  Pt tolerated injection well.   Pt advised to RTO 5/17-5/31 for next depo.  Pt has no other concerns today.  Wt 292 lb (132.5 kg)   BMI 50.12 kg/m    Administrations This Visit    medroxyPROGESTERone (DEPO-PROVERA) injection 150 mg    Admin Date 02/05/2020 Action Given Dose 150 mg Route Intramuscular Administered By Lanney Gins, CMA

## 2020-02-05 NOTE — Progress Notes (Signed)
Patient seen and assessed by nursing staff during this encounter. I have reviewed the chart and agree with the documentation and plan.  Catalina Antigua, MD 02/05/2020 11:24 AM

## 2020-02-20 ENCOUNTER — Ambulatory Visit (HOSPITAL_COMMUNITY)
Admission: EM | Admit: 2020-02-20 | Discharge: 2020-02-20 | Disposition: A | Discharge status: Home / Self Care | Payer: Commercial Managed Care - PPO | Attending: Family Medicine | Admitting: Family Medicine

## 2020-02-20 ENCOUNTER — Ambulatory Visit (INDEPENDENT_AMBULATORY_CARE_PROVIDER_SITE_OTHER): Payer: Commercial Managed Care - PPO

## 2020-02-20 ENCOUNTER — Other Ambulatory Visit: Payer: Self-pay

## 2020-02-20 ENCOUNTER — Encounter (HOSPITAL_COMMUNITY): Payer: Self-pay

## 2020-02-20 DIAGNOSIS — M25562 Pain in left knee: Secondary | ICD-10-CM | POA: Diagnosis not present

## 2020-02-20 MED ORDER — PREDNISONE 10 MG (21) PO TBPK: ORAL_TABLET | ORAL | 0 refills | Status: DC

## 2020-02-20 NOTE — Discharge Instructions (Signed)
Your pain is most likely due to arthritis We are treating this with a prednisone taper over the next 6 days.  Make sure you take this with food. You need to rest, ice and elevate the knee and stay off for couple days. Nothing strenuous for the next week or so Work note given and knee sleeve given here to wear Follow up as needed for continued or worsening symptoms

## 2020-02-20 NOTE — ED Triage Notes (Signed)
Pt c/o left knee pain that started 2 days ago. Pt denies injury to the knee. She believes she might have walked on it too much. Pt has 1+ edema of left knee, 2+ left pedal pulse, pt able to move extremity, warm to touch. Pt had to limp to exam room.

## 2020-02-21 NOTE — ED Provider Notes (Signed)
[facility]    CSN: [id number] Arrival date & time: 02/20/20  1120      History   Chief Complaint Chief Complaint  Patient presents with  . Knee Pain    HPI Tanya Moreno is a 29 y.o. female.   Patient is a 29-year-old female presents today with left knee pain.  This started 2 days ago.  Denies any specific injuries to the knee.  Mild swelling without erythema.  Started after doing a lot of walking the day previous.  Pain with ambulation.  No radiation of pain, numbness or tingling.  ROS per HPI      Past Medical History:  Diagnosis Date  . Chlamydia   . Chlamydia infection affecting pregnancy in first trimester 02/09/2017   Positive on 02/08/17, azithromycin 1000 mg PO x 1 dose sent to pt pharmacy and pt notified by phone by [facility] staff Positive on 7/30  . Genital HSV    Last outbreak Nov 2013  . Gonorrhea   . Hypertension   . Trichomonas     Patient Active Problem List   Diagnosis Date Noted  . Chronic hypertension 07/22/2019  . Abnormal Papanicolaou smear of cervix with positive human papilloma virus (HPV) test on 12/30/18 01/08/2019  . Carpal tunnel syndrome during pregnancy 08/24/2017  . History of HSV 04/14/2017  . BMI 40.0-44.9, adult (HCC) 04/14/2017  . Latex allergy 08/26/2011    Past Surgical History:  Procedure Laterality Date  . NO PAST SURGERIES      OB History    Gravida  4   Para  4   Term  4   Preterm      AB      Living  4     SAB      TAB      Ectopic      Multiple  0   Live Births  4            Home Medications    Prior to Admission medications   Medication Sig Start Date End Date Taking? Authorizing Provider  EPINEPHrine 0.3 mg/0.3 mL IJ SOAJ injection Inject 0.3 mLs (0.3 mg total) into the muscle as needed for anaphylaxis. 09/30/19   Liberty Handy, PA-C  labetalol (NORMODYNE) 200 MG tablet Take 1 tablet (200 mg total) by mouth daily. 08/28/19   Anyanwu, Jethro Bastos, MD  medroxyPROGESTERone  (DEPO-PROVERA) 150 MG/ML injection Inject 1 mL (150 mg total) into the muscle every 3 (three) months. 10/23/19   Constant, Peggy, MD  predniSONE (STERAPRED UNI-PAK 21 TAB) 10 MG (21) TBPK tablet 6 tabs for 1 day, then 5 tabs for 1 das, then 4 tabs for 1 day, then 3 tabs for 1 day, 2 tabs for 1 day, then 1 tab for 1 day 02/20/20   Dahlia Byes A, NP  valACYclovir (VALTREX) 500 MG tablet Take 1 tablet (500 mg total) by mouth 2 (two) times daily. Patient not taking: Reported on 08/28/2019 06/29/19   Hermina Staggers, MD    Family History No family history on file.  Social History Social History   Tobacco Use  . Smoking status: Former Smoker    Quit date: 2015    Years since quitting: 6.2  . Smokeless tobacco: Former Neurosurgeon    Quit date: 11/10/2014  Substance Use Topics  . Alcohol use: No    Alcohol/week: 0.0 standard drinks  . Drug use: No     Allergies   Latex and Tape  Review of Systems Review of Systems   Physical Exam Triage Vital Signs ED Triage Vitals  Enc Vitals Group     BP 02/20/20 1145 (!) 157/99     Pulse Rate 02/20/20 1145 91     Resp 02/20/20 1145 18     Temp 02/20/20 1145 98.6 F (37 C)     Temp Source 02/20/20 1145 Oral     SpO2 02/20/20 1145 98 %     Weight 02/20/20 1148 292 lb (132.5 kg)     Height 02/20/20 1148 5\' 4"  (1.626 m)     Head Circumference --      Peak Flow --      Pain Score 02/20/20 1146 10     Pain Loc --      Pain Edu? --      Excl. in GC? --    No data found.  Updated Vital Signs BP (!) 157/99 (BP Location: Left Arm)   Pulse 91   Temp 98.6 F (37 C) (Oral)   Resp 18   Ht 5\' 4"  (1.626 m)   Wt 292 lb (132.5 kg)   SpO2 98%   BMI 50.12 kg/m   Visual Acuity Right Eye Distance:   Left Eye Distance:   Bilateral Distance:    Right Eye Near:   Left Eye Near:    Bilateral Near:     Physical Exam Vitals and nursing note reviewed.  Constitutional:      General: She is not in acute distress.    Appearance: Normal appearance.  She is not ill-appearing, toxic-appearing or diaphoretic.  HENT:     Head: Normocephalic.     Nose: Nose normal.  Eyes:     Conjunctiva/sclera: Conjunctivae normal.  Pulmonary:     Effort: Pulmonary effort is normal.  Musculoskeletal:     Cervical back: Normal range of motion.     Left knee: Swelling and bony tenderness present. Decreased range of motion. Tenderness present over the medial joint line and lateral joint line. No LCL laxity, MCL laxity, ACL laxity or PCL laxity. Skin:    General: Skin is warm and dry.     Findings: No rash.  Neurological:     Mental Status: She is alert.  Psychiatric:        Mood and Affect: Mood normal.      UC Treatments / Results  Labs (all labs ordered are listed, but only abnormal results are displayed) Labs Reviewed - No data to display  EKG   Radiology DG Knee Complete 4 Views Left  Result Date: 02/20/2020 CLINICAL DATA:  Left knee injury and pain while walking down stairs today. Initial encounter. EXAM: LEFT KNEE - COMPLETE 4+ VIEW COMPARISON:  02/23/2018 FINDINGS: No evidence of fracture, dislocation, or joint effusion. Mild medial compartment osteoarthritis is again seen. No other osseous abnormality identified. IMPRESSION: No acute findings. Mild medial compartment osteoarthritis. Electronically Signed   By: 02/22/2020 M.D.   On: 02/20/2020 12:28    Procedures Procedures (including critical care time)  Medications Ordered in UC Medications - No data to display  Initial Impression / Assessment and Plan / UC Course  I have reviewed the triage vital signs and the nursing notes.  Pertinent labs & imaging results that were available during my care of the patient were reviewed by me and considered in my medical decision making (see chart for details).     Knee pain- x ray without anything acute.  Most likely arthritis.  Treated with  prednisone taper over the next 6 days. Rest, ice, elevate Knee sleeve  No strenuous activity x  1 week.  Work note given Follow up as needed for continued or worsening symptoms  Final Clinical Impressions(s) / UC Diagnoses   Final diagnoses:  Acute pain of left knee     Discharge Instructions     Your pain is most likely due to arthritis We are treating this with a prednisone taper over the next 6 days.  Make sure you take this with food. You need to rest, ice and elevate the knee and stay off for couple days. Nothing strenuous for the next week or so Work note given and knee sleeve given here to wear Follow up as needed for continued or worsening symptoms     ED Prescriptions    Medication Sig Dispense Auth. Provider   predniSONE (STERAPRED UNI-PAK 21 TAB) 10 MG (21) TBPK tablet 6 tabs for 1 day, then 5 tabs for 1 das, then 4 tabs for 1 day, then 3 tabs for 1 day, 2 tabs for 1 day, then 1 tab for 1 day 21 tablet Paticia Moster A, NP     PDMP not reviewed this encounter.   Orvan July, NP 02/21/20 [phone]

## 2020-04-25 ENCOUNTER — Ambulatory Visit: Payer: Commercial Managed Care - PPO

## 2020-04-29 ENCOUNTER — Ambulatory Visit (INDEPENDENT_AMBULATORY_CARE_PROVIDER_SITE_OTHER): Payer: Medicaid Other

## 2020-04-29 ENCOUNTER — Other Ambulatory Visit: Payer: Self-pay

## 2020-04-29 VITALS — BP 144/92 | HR 66 | Ht 64.0 in | Wt 285.0 lb

## 2020-04-29 DIAGNOSIS — Z3042 Encounter for surveillance of injectable contraceptive: Secondary | ICD-10-CM

## 2020-04-29 MED ORDER — MEDROXYPROGESTERONE ACETATE 150 MG/ML IM SUSP
150.0000 mg | Freq: Once | INTRAMUSCULAR | Status: AC
  Administered 2020-04-29: 150 mg via INTRAMUSCULAR

## 2020-04-29 NOTE — Progress Notes (Signed)
Patient was assessed and managed by nursing staff during this encounter. I have reviewed the chart and agree with the documentation and plan. I have also made any necessary editorial changes.  Jaynie Collins, MD 04/29/2020 2:26 PM

## 2020-04-29 NOTE — Progress Notes (Signed)
GYN presents for DEPO, given in LD, tolerated well.  Needs Annual Exam before next DEPO Injection.  Next DEPO August 9-23, 2021  Administrations This Visit    medroxyPROGESTERone (DEPO-PROVERA) injection 150 mg    Admin Date 04/29/2020 Action Given Dose 150 mg Route Intramuscular Administered By Maretta Bees, RMA

## 2020-05-28 ENCOUNTER — Ambulatory Visit: Payer: Medicaid Other | Admitting: Obstetrics and Gynecology

## 2020-06-17 ENCOUNTER — Other Ambulatory Visit (HOSPITAL_COMMUNITY)
Admission: RE | Admit: 2020-06-17 | Discharge: 2020-06-17 | Disposition: A | Discharge status: Home / Self Care | Payer: Medicaid Other | Source: Ambulatory Visit | Attending: Advanced Practice Midwife | Admitting: Advanced Practice Midwife

## 2020-06-17 ENCOUNTER — Encounter: Payer: Self-pay | Admitting: Advanced Practice Midwife

## 2020-06-17 ENCOUNTER — Other Ambulatory Visit: Payer: Self-pay

## 2020-06-17 ENCOUNTER — Ambulatory Visit (INDEPENDENT_AMBULATORY_CARE_PROVIDER_SITE_OTHER): Payer: Medicaid Other | Admitting: Advanced Practice Midwife

## 2020-06-17 VITALS — BP 147/95 | HR 73 | Ht 64.0 in | Wt 287.0 lb

## 2020-06-17 DIAGNOSIS — Z01419 Encounter for gynecological examination (general) (routine) without abnormal findings: Secondary | ICD-10-CM | POA: Diagnosis not present

## 2020-06-17 DIAGNOSIS — R87820 Cervical low risk human papillomavirus (HPV) DNA test positive: Secondary | ICD-10-CM

## 2020-06-17 DIAGNOSIS — Z124 Encounter for screening for malignant neoplasm of cervix: Secondary | ICD-10-CM | POA: Diagnosis not present

## 2020-06-17 DIAGNOSIS — I10 Essential (primary) hypertension: Secondary | ICD-10-CM | POA: Diagnosis not present

## 2020-06-17 MED ORDER — AMLODIPINE BESYLATE 10 MG PO TABS: 10.0000 mg | ORAL_TABLET | Freq: Every day | ORAL | 3 refills | Status: DC

## 2020-06-17 NOTE — Progress Notes (Signed)
Subjective:     Tanya Moreno is a 29 y.o. female here at [facility] for a routine exam.  Current complaints: None.   Do you have a primary care provider? No     Gynecologic History Patient's last menstrual period was 05/10/2020 (within days). Contraception: Depo-Provera injections Last Pap: 2020. Results were: abnormal with normal cytology, positive hpv  Obstetric History OB History  Gravida Para Term Preterm AB Living  4 4 4     4   SAB TAB Ectopic Multiple Live Births        0 4    # Outcome Date GA Lbr Len/2nd Weight Sex Delivery Anes PTL Lv  4 Term 07/23/19 [redacted]w[redacted]d 00:30 / 00:02 8 lb 12.4 oz (3.98 kg) F Vag-Spont None  LIV     Birth Comments: wnl  3 Term 10/17/17 [redacted]w[redacted]d 03:33 / 00:01 8 lb 10.8 oz (3.935 kg) M Vag-Spont None  LIV     Birth Comments: wnl  2 Term 04/12/13 [redacted]w[redacted]d 16:45 / 00:09 6 lb 11.4 oz (3.045 kg) F Vag-Spont None  LIV  1 Term 11/01/11 [redacted]w[redacted]d 16:25 / 00:04 6 lb 13.9 oz (3.116 kg) M Vag-Spont None  LIV     Birth Comments: none     The following portions of the patient's history were reviewed and updated as appropriate: allergies, current medications, past family history, past medical history, past social history, past surgical history and problem list.  Review of Systems Pertinent items noted in HPI and remainder of comprehensive ROS otherwise negative.    Objective:   BP (!) 147/95   Pulse 73   Ht 5\' 4"  (1.626 m)   Wt 287 lb (130.2 kg)   LMP 05/10/2020 (Within Days)   BMI 49.26 kg/m    VS reviewed, nursing note reviewed,  Constitutional: well developed, well nourished, no distress HEENT: normocephalic CV: normal rate Pulm/chest wall: normal effort Breast Exam: Deferred with shared decision making Abdomen: soft Neuro: alert and oriented x 3 Skin: warm, dry Psych: affect normal Pelvic exam: Cervix pink, visually closed, without lesion, friable to Pap brush, scant white creamy discharge, vaginal walls and external genitalia normal Bimanual exam:  Cervix 0/long/high, firm, anterior, neg CMT, uterus nontender, nonenlarged, adnexa without tenderness, enlargement, or mass     Assessment/Plan:   1. Cervical cancer screening - Cytology - PAP( [place])  2. Well woman exam with routine gynecological exam --No gyn concerns or problems.  Depo for contraception, light irregular bleeding with Depo.   3. Essential hypertension --Pt without PCP. Taking labetalol since her last pregnancy, delivery on 07/23/2019. --Poor compliance with BID labetalol and pt has BP cuff at home, reports frequent BP 140s/90s on medication. --Pt to f/u with PCP as soon as possible, contact information given today. - amLODipine (NORVASC) 10 MG tablet; Take 1 tablet (10 mg total) by mouth daily.  Dispense: 30 tablet; Refill: 3  4. Papanicolaou smear of cervix with low risk human papillomavirus (HPV) DNA test positive --Pap with normal cytology, Hpv positive 2020, repeat Pap today.    Follow up in: 1 year or as needed.   07/10/2020, CNM 4:34 PM

## 2020-06-17 NOTE — Patient Instructions (Addendum)
Primary Care Offices:  [facility] [address], [place], [place] [address] Phone: [phone]  [facility] [place], [place], [place] [address] Phone: [phone]  [facility], [place], [place], [place] [address] Phone: [phone]    Preventive Care 30-30 Years Old, Female Preventive care refers to visits with your health care provider and lifestyle choices that can promote health and wellness. This includes:  A yearly physical exam. This may also be called an annual well check.  Regular dental visits and eye exams.  Immunizations.  Screening for certain conditions.  Healthy lifestyle choices, such as eating a healthy diet, getting regular exercise, not using drugs or products that contain nicotine and tobacco, and limiting alcohol use. What can I expect for my preventive care visit? Physical exam Your health care provider will check your:  Height and weight. This may be used to calculate body mass index (BMI), which tells if you are at a healthy weight.  Heart rate and blood pressure.  Skin for abnormal spots. Counseling Your health care provider may ask you questions about your:  Alcohol, tobacco, and drug use.  Emotional well-being.  Home and relationship well-being.  Sexual activity.  Eating habits.  Work and work Statistician.  Method of birth control.  Menstrual cycle.  Pregnancy history. What immunizations do I need?  Influenza (flu) vaccine  This is recommended every year. Tetanus, diphtheria, and pertussis (Tdap) vaccine  You may need a Td booster every 10 years. Varicella (chickenpox) vaccine  You may need this if you have not been vaccinated. Human papillomavirus (HPV) vaccine  If recommended by your health care provider, you may need three doses over 6 months. Measles, mumps, and rubella (MMR) vaccine  You  may need at least one dose of MMR. You may also need a second dose. Meningococcal conjugate (MenACWY) vaccine  One dose is recommended if you are age 55-21 years and a first-year college student living in a residence hall, or if you have one of several medical conditions. You may also need additional booster doses. Pneumococcal conjugate (PCV13) vaccine  You may need this if you have certain conditions and were not previously vaccinated. Pneumococcal polysaccharide (PPSV23) vaccine  You may need one or two doses if you smoke cigarettes or if you have certain conditions. Hepatitis A vaccine  You may need this if you have certain conditions or if you travel or work in places where you may be exposed to hepatitis A. Hepatitis B vaccine  You may need this if you have certain conditions or if you travel or work in places where you may be exposed to hepatitis B. Haemophilus influenzae type b (Hib) vaccine  You may need this if you have certain conditions. You may receive vaccines as individual doses or as more than one vaccine together in one shot (combination vaccines). Talk with your health care provider about the risks and benefits of combination vaccines. What tests do I need?  Blood tests  Lipid and cholesterol levels. These may be checked every 5 years starting at age 75.  Hepatitis C test.  Hepatitis B test. Screening  Diabetes screening. This is done by checking your blood sugar (glucose) after you have not eaten for a while (fasting).  Sexually transmitted disease (STD) testing.  BRCA-related cancer screening. This may be done if you have a family history of breast, ovarian, tubal, or peritoneal  cancers.  Pelvic exam and Pap test. This may be done every 3 years starting at age 70. Starting at age 26, this may be done every 5 years if you have a Pap test in combination with an HPV test. Talk with your health care provider about your test results, treatment options, and if  necessary, the need for more tests. Follow these instructions at home: Eating and drinking   Eat a diet that includes fresh fruits and vegetables, whole grains, lean protein, and low-fat dairy.  Take vitamin and mineral supplements as recommended by your health care provider.  Do not drink alcohol if: ? Your health care provider tells you not to drink. ? You are pregnant, may be pregnant, or are planning to become pregnant.  If you drink alcohol: ? Limit how much you have to 0-1 drink a day. ? Be aware of how much alcohol is in your drink. In the [place] one drink equals one 12 oz bottle of beer (355 mL), one 5 oz glass of wine (148 mL), or one 1 oz glass of hard liquor (44 mL). Lifestyle  Take daily care of your teeth and gums.  Stay active. Exercise for at least 30 minutes on 5 or more days each week.  Do not use any products that contain nicotine or tobacco, such as cigarettes, e-cigarettes, and chewing tobacco. If you need help quitting, ask your health care provider.  If you are sexually active, practice safe sex. Use a condom or other form of birth control (contraception) in order to prevent pregnancy and STIs (sexually transmitted infections). If you plan to become pregnant, see your health care provider for a preconception visit. What's next?  Visit your health care provider once a year for a well check visit.  Ask your health care provider how often you should have your eyes and teeth checked.  Stay up to date on all vaccines. This information is not intended to replace advice given to you by your health care provider. Make sure you discuss any questions you have with your health care provider. Document Revised: 08/04/2018 Document Reviewed: 08/04/2018 Elsevier Patient Education  2020 [employer].

## 2020-06-17 NOTE — Progress Notes (Signed)
Pt presents for annual and pap. Negative pap with HPV detected 12/30/2018  Needs Depo refill. Last Depo 04/29/20, next Depo 07/22/20 Declines STD testing Denies abnormal vaginal discharge  Elevated BP - pt did not take BP meds today

## 2020-06-26 LAB — CYTOLOGY - PAP
Comment: NEGATIVE
Comment: NEGATIVE
Diagnosis: HIGH — AB
HPV 16: NEGATIVE
HPV 18 / 45: NEGATIVE
High risk HPV: POSITIVE — AB

## 2020-07-18 ENCOUNTER — Other Ambulatory Visit: Payer: Self-pay

## 2020-07-18 ENCOUNTER — Ambulatory Visit: Payer: Medicaid Other | Admitting: Obstetrics & Gynecology

## 2020-07-18 DIAGNOSIS — I10 Essential (primary) hypertension: Secondary | ICD-10-CM

## 2020-07-18 MED ORDER — MEDROXYPROGESTERONE ACETATE 150 MG/ML IM SUSP: 150.0000 mg | INTRAMUSCULAR | 2 refills | Status: DC

## 2020-07-19 ENCOUNTER — Encounter (HOSPITAL_COMMUNITY): Payer: Self-pay

## 2020-07-19 ENCOUNTER — Other Ambulatory Visit: Payer: Self-pay

## 2020-07-19 ENCOUNTER — Ambulatory Visit (HOSPITAL_COMMUNITY)
Admission: EM | Admit: 2020-07-19 | Discharge: 2020-07-19 | Disposition: A | Discharge status: Home / Self Care | Payer: Medicaid Other | Attending: Family Medicine | Admitting: Family Medicine

## 2020-07-19 DIAGNOSIS — Z3202 Encounter for pregnancy test, result negative: Secondary | ICD-10-CM | POA: Diagnosis not present

## 2020-07-19 DIAGNOSIS — S39012A Strain of muscle, fascia and tendon of lower back, initial encounter: Secondary | ICD-10-CM

## 2020-07-19 DIAGNOSIS — M545 Low back pain: Secondary | ICD-10-CM | POA: Diagnosis not present

## 2020-07-19 LAB — POCT URINALYSIS DIPSTICK, ED / UC
Bilirubin Urine: NEGATIVE
Glucose, UA: NEGATIVE mg/dL
Ketones, ur: NEGATIVE mg/dL
Nitrite: NEGATIVE
Protein, ur: 100 mg/dL — AB
Specific Gravity, Urine: >=1.03 (ref 1.005–1.030)
Urobilinogen, UA: 0.2 mg/dL (ref 0.0–1.0)
pH: 6 (ref 5.0–8.0)

## 2020-07-19 LAB — POC URINE PREG, ED: Preg Test, Ur: NEGATIVE

## 2020-07-19 MED ORDER — IBUPROFEN 600 MG PO TABS
600.0000 mg | ORAL_TABLET | Freq: Three times a day (TID) | ORAL | 0 refills | Status: DC | PRN
Start: 2020-07-19 — End: 2020-11-03

## 2020-07-19 MED ORDER — CYCLOBENZAPRINE HCL 10 MG PO TABS
10.0000 mg | ORAL_TABLET | Freq: Two times a day (BID) | ORAL | 0 refills | Status: DC | PRN
Start: 2020-07-19 — End: 2021-06-19

## 2020-07-19 NOTE — ED Triage Notes (Signed)
Pt c/o 10/10 throbbing pain in middle of back. Pt denies injury. Pt denies numbness and tingling. Pt was able to walk to triage room.

## 2020-07-19 NOTE — ED Provider Notes (Signed)
[facility]    CSN: [id number] Arrival date & time: 07/19/20  1021      History   Chief Complaint Chief Complaint  Patient presents with  . Back Pain    HPI Tanya Moreno is a 29 y.o. female.   Pt is a 29 year old female that presents with lower back pain. This has been present for the past 3 days.  Describes as 10 out of 10 throbbing pain in the middle of the back.  She is been doing icy hot and heat packs to the back without much relief.  Patient is currently on her menstrual cycle.  No radiation of pain, numbness, tingling or weakness in the extremities.  No dysuria, hematuria or urinary frequency.  ROS per HPI      Past Medical History:  Diagnosis Date  . Chlamydia   . Chlamydia infection affecting pregnancy in first trimester 02/09/2017   Positive on 02/08/17, azithromycin 1000 mg PO x 1 dose sent to pt pharmacy and pt notified by phone by [facility] staff Positive on 7/30  . Genital HSV    Last outbreak Nov 2013  . Gonorrhea   . Hypertension   . Trichomonas     Patient Active Problem List   Diagnosis Date Noted  . Chronic hypertension 07/22/2019  . Abnormal Papanicolaou smear of cervix with positive human papilloma virus (HPV) test on 12/30/18 01/08/2019  . Carpal tunnel syndrome during pregnancy 08/24/2017  . History of HSV 04/14/2017  . BMI 40.0-44.9, adult (HCC) 04/14/2017  . Latex allergy 08/26/2011    Past Surgical History:  Procedure Laterality Date  . NO PAST SURGERIES      OB History    Gravida  4   Para  4   Term  4   Preterm      AB      Living  4     SAB      TAB      Ectopic      Multiple  0   Live Births  4            Home Medications    Prior to Admission medications   Medication Sig Start Date End Date Taking? Authorizing Provider  amLODipine (NORVASC) 10 MG tablet Take 1 tablet (10 mg total) by mouth daily. 06/17/20   Leftwich-Kirby, Wilmer Floor, CNM  cyclobenzaprine (FLEXERIL) 10 MG tablet Take 1 tablet (10 mg  total) by mouth 2 (two) times daily as needed for muscle spasms. 07/19/20   Dahlia Byes A, NP  ibuprofen (ADVIL) 600 MG tablet Take 1 tablet (600 mg total) by mouth every 8 (eight) hours as needed for moderate pain. 07/19/20   Dahlia Byes A, NP  medroxyPROGESTERone (DEPO-PROVERA) 150 MG/ML injection Inject 1 mL (150 mg total) into the muscle every 3 (three) months. 07/18/20   Brock Bad, MD    Family History No family history on file.  Social History Social History   Tobacco Use  . Smoking status: Former Smoker    Quit date: 2015    Years since quitting: 6.6  . Smokeless tobacco: Former Neurosurgeon    Quit date: 11/10/2014  Vaping Use  . Vaping Use: Never used  Substance Use Topics  . Alcohol use: No    Alcohol/week: 0.0 standard drinks  . Drug use: No     Allergies   Latex and Tape   Review of Systems Review of Systems   Physical Exam Triage Vital Signs ED  Triage Vitals  Enc Vitals Group     BP 07/19/20 1055 (!) 137/92     Pulse --      Resp 07/19/20 1055 18     Temp 07/19/20 1055 99.1 F (37.3 C)     Temp Source 07/19/20 1055 Oral     SpO2 --      Weight 07/19/20 1058 282 lb (127.9 kg)     Height 07/19/20 1058 5\' 4"  (1.626 m)     Head Circumference --      Peak Flow --      Pain Score 07/19/20 1058 10     Pain Loc --      Pain Edu? --      Excl. in GC? --    No data found.  Updated Vital Signs BP (!) 137/92   Temp 99.1 F (37.3 C) (Oral)   Resp 18   Ht 5\' 4"  (1.626 m)   Wt 282 lb (127.9 kg)   BMI 48.41 kg/m   Visual Acuity Right Eye Distance:   Left Eye Distance:   Bilateral Distance:    Right Eye Near:   Left Eye Near:    Bilateral Near:     Physical Exam Vitals and nursing note reviewed.  Constitutional:      General: She is not in acute distress.    Appearance: Normal appearance. She is not ill-appearing, toxic-appearing or diaphoretic.  HENT:     Head: Normocephalic.     Nose: Nose normal.  Eyes:     Conjunctiva/sclera:  Conjunctivae normal.  Pulmonary:     Effort: Pulmonary effort is normal.  Musculoskeletal:        General: Normal range of motion.     Cervical back: Normal range of motion.       Back:     Comments: TTP    Skin:    General: Skin is warm and dry.     Findings: No rash.  Neurological:     Mental Status: She is alert.  Psychiatric:        Mood and Affect: Mood normal.      UC Treatments / Results  Labs (all labs ordered are listed, but only abnormal results are displayed) Labs Reviewed  POCT URINALYSIS DIPSTICK, ED / UC - Abnormal; Notable for the following components:      Result Value   Hgb urine dipstick LARGE (*)    Protein, ur 100 (*)    Leukocytes,Ua TRACE (*)    All other components within normal limits  URINE CULTURE  POC URINE PREG, ED    EKG   Radiology No results found.  Procedures Procedures (including critical care time)  Medications Ordered in UC Medications - No data to display  Initial Impression / Assessment and Plan / UC Course  I have reviewed the triage vital signs and the nursing notes.  Pertinent labs & imaging results that were available during my care of the patient were reviewed by me and considered in my medical decision making (see chart for details).     Lumbar strain Most likely diagnosis.  Patient urine with trace leuks and large hemoglobin.  Patient currently on her menstrual cycle. We will treat the pain with ibuprofen and Flexeril for muscle relaxant Rest and alternate heat and ice. Follow up as needed for continued or worsening symptoms  Final Clinical Impressions(s) / UC Diagnoses   Final diagnoses:  Strain of lumbar region, initial encounter     Discharge Instructions  Most likely strain of the back.  Take the medication as prescribed.  Beware of drowsiness and the muscle relaxer.  Take the ibuprofen with food. Rest, alternate heat and ice Your urine had trace bacteria and blood.  I am sending for culture If  this comes back positive we will treat you for a urinary tract infection. Follow up as needed for continued or worsening symptoms     ED Prescriptions    Medication Sig Dispense Auth. Provider   ibuprofen (ADVIL) 600 MG tablet Take 1 tablet (600 mg total) by mouth every 8 (eight) hours as needed for moderate pain. 30 tablet Quandre Polinski A, NP   cyclobenzaprine (FLEXERIL) 10 MG tablet Take 1 tablet (10 mg total) by mouth 2 (two) times daily as needed for muscle spasms. 20 tablet Dahlia Byes A, NP     PDMP not reviewed this encounter.   Janace Aris, NP 07/19/20 1226

## 2020-07-19 NOTE — Discharge Instructions (Addendum)
Most likely strain of the back.  Take the medication as prescribed.  Beware of drowsiness and the muscle relaxer.  Take the ibuprofen with food. Rest, alternate heat and ice Your urine had trace bacteria and blood.  I am sending for culture If this comes back positive we will treat you for a urinary tract infection. Follow up as needed for continued or worsening symptoms

## 2020-07-20 LAB — URINE CULTURE: Culture: <10000 — AB

## 2020-07-22 ENCOUNTER — Other Ambulatory Visit: Payer: Self-pay

## 2020-07-22 ENCOUNTER — Ambulatory Visit: Payer: Medicaid Other

## 2020-07-22 ENCOUNTER — Encounter (HOSPITAL_COMMUNITY): Payer: Self-pay

## 2020-07-22 DIAGNOSIS — R05 Cough: Secondary | ICD-10-CM | POA: Diagnosis not present

## 2020-07-22 DIAGNOSIS — M791 Myalgia, unspecified site: Secondary | ICD-10-CM | POA: Diagnosis present

## 2020-07-22 DIAGNOSIS — R509 Fever, unspecified: Secondary | ICD-10-CM | POA: Insufficient documentation

## 2020-07-22 DIAGNOSIS — R0602 Shortness of breath: Secondary | ICD-10-CM | POA: Insufficient documentation

## 2020-07-22 DIAGNOSIS — Z5321 Procedure and treatment not carried out due to patient leaving prior to being seen by health care provider: Secondary | ICD-10-CM | POA: Diagnosis not present

## 2020-07-22 DIAGNOSIS — U071 COVID-19: Secondary | ICD-10-CM | POA: Diagnosis not present

## 2020-07-22 LAB — CBC WITH DIFFERENTIAL/PLATELET
Abs Immature Granulocytes: 0.02 10*3/uL (ref 0.00–0.07)
Basophils Absolute: 0 10*3/uL (ref 0.0–0.1)
Basophils Relative: 0 %
Eosinophils Absolute: 0 10*3/uL (ref 0.0–0.5)
Eosinophils Relative: 0 %
HCT: 40.5 % (ref 36.0–46.0)
Hemoglobin: 13.9 g/dL (ref 12.0–15.0)
Immature Granulocytes: 0 %
Lymphocytes Relative: 49 %
Lymphs Abs: 2.5 10*3/uL (ref 0.7–4.0)
MCH: 30.3 pg (ref 26.0–34.0)
MCHC: 34.3 g/dL (ref 30.0–36.0)
MCV: 88.4 fL (ref 80.0–100.0)
Monocytes Absolute: 0.3 10*3/uL (ref 0.1–1.0)
Monocytes Relative: 6 %
Neutro Abs: 2.3 10*3/uL (ref 1.7–7.7)
Neutrophils Relative %: 45 %
Platelets: 236 10*3/uL (ref 150–400)
RBC: 4.58 MIL/uL (ref 3.87–5.11)
RDW: 12.4 % (ref 11.5–15.5)
WBC: 5.1 10*3/uL (ref 4.0–10.5)
nRBC: 0 % (ref 0.0–0.2)

## 2020-07-22 LAB — COMPREHENSIVE METABOLIC PANEL
ALT: 24 U/L (ref 0–44)
AST: 23 U/L (ref 15–41)
Albumin: 4.1 g/dL (ref 3.5–5.0)
Alkaline Phosphatase: 63 U/L (ref 38–126)
Anion gap: 14 (ref 5–15)
BUN: 8 mg/dL (ref 6–20)
CO2: 24 mmol/L (ref 22–32)
Calcium: 9 mg/dL (ref 8.9–10.3)
Chloride: 102 mmol/L (ref 98–111)
Creatinine, Ser: 0.74 mg/dL (ref 0.44–1.00)
GFR calc Af Amer: >60 mL/min (ref >60)
GFR calc non Af Amer: >60 mL/min (ref >60)
Glucose, Bld: 106 mg/dL — ABNORMAL HIGH (ref 70–99)
Potassium: 3.3 mmol/L — ABNORMAL LOW (ref 3.5–5.1)
Sodium: 140 mmol/L (ref 135–145)
Total Bilirubin: 0.6 mg/dL (ref 0.3–1.2)
Total Protein: 8.2 g/dL — ABNORMAL HIGH (ref 6.5–8.1)

## 2020-07-22 LAB — SARS CORONAVIRUS 2 BY RT PCR (HOSPITAL ORDER, PERFORMED IN ~~LOC~~ HOSPITAL LAB): SARS Coronavirus 2: POSITIVE — AB

## 2020-07-22 MED ORDER — ACETAMINOPHEN 325 MG PO TABS
650.0000 mg | ORAL_TABLET | Freq: Once | ORAL | Status: AC | PRN
  Administered 2020-07-22: 650 mg via ORAL
  Filled 2020-07-22: qty 2

## 2020-07-22 NOTE — ED Notes (Signed)
Gray on ice , blue top, 2lav, 2 lt grns, 4golds also collected

## 2020-07-22 NOTE — ED Triage Notes (Signed)
Pt sts generalized body aches and chills for 4 days. Shob, cough and intermittent fevers. Took tylenol at 1000 today. Denies receiving vaccine.

## 2020-07-23 ENCOUNTER — Emergency Department (HOSPITAL_COMMUNITY)
Admission: EM | Admit: 2020-07-23 | Discharge: 2020-07-23 | Disposition: A | Discharge status: Home / Self Care | Payer: Medicaid Other | Attending: Emergency Medicine | Admitting: Emergency Medicine

## 2020-07-23 NOTE — ED Notes (Signed)
First attempt Pt was not in the lobby for vitals recheck.

## 2020-07-25 ENCOUNTER — Emergency Department (HOSPITAL_COMMUNITY): Payer: Medicaid Other

## 2020-07-25 ENCOUNTER — Emergency Department (HOSPITAL_COMMUNITY)
Admission: EM | Admit: 2020-07-25 | Discharge: 2020-07-25 | Disposition: A | Discharge status: Home / Self Care | Payer: Medicaid Other | Attending: Emergency Medicine | Admitting: Emergency Medicine

## 2020-07-25 ENCOUNTER — Other Ambulatory Visit: Payer: Self-pay

## 2020-07-25 DIAGNOSIS — Z91040 Latex allergy status: Secondary | ICD-10-CM | POA: Diagnosis not present

## 2020-07-25 DIAGNOSIS — R0602 Shortness of breath: Secondary | ICD-10-CM | POA: Insufficient documentation

## 2020-07-25 DIAGNOSIS — Z87891 Personal history of nicotine dependence: Secondary | ICD-10-CM | POA: Diagnosis not present

## 2020-07-25 DIAGNOSIS — I10 Essential (primary) hypertension: Secondary | ICD-10-CM | POA: Diagnosis not present

## 2020-07-25 DIAGNOSIS — U071 COVID-19: Secondary | ICD-10-CM

## 2020-07-25 DIAGNOSIS — R197 Diarrhea, unspecified: Secondary | ICD-10-CM | POA: Insufficient documentation

## 2020-07-25 DIAGNOSIS — Z79899 Other long term (current) drug therapy: Secondary | ICD-10-CM | POA: Diagnosis not present

## 2020-07-25 DIAGNOSIS — R509 Fever, unspecified: Secondary | ICD-10-CM | POA: Insufficient documentation

## 2020-07-25 DIAGNOSIS — E876 Hypokalemia: Secondary | ICD-10-CM

## 2020-07-25 LAB — CBC
HCT: 41.9 % (ref 36.0–46.0)
Hemoglobin: 13.7 g/dL (ref 12.0–15.0)
MCH: 28.8 pg (ref 26.0–34.0)
MCHC: 32.7 g/dL (ref 30.0–36.0)
MCV: 88 fL (ref 80.0–100.0)
Platelets: 317 10*3/uL (ref 150–400)
RBC: 4.76 MIL/uL (ref 3.87–5.11)
RDW: 12.2 % (ref 11.5–15.5)
WBC: 5.7 10*3/uL (ref 4.0–10.5)
nRBC: 0 % (ref 0.0–0.2)

## 2020-07-25 LAB — COMPREHENSIVE METABOLIC PANEL
ALT: 40 U/L (ref 0–44)
AST: 33 U/L (ref 15–41)
Albumin: 3.6 g/dL (ref 3.5–5.0)
Alkaline Phosphatase: 55 U/L (ref 38–126)
Anion gap: 14 (ref 5–15)
BUN: 7 mg/dL (ref 6–20)
CO2: 24 mmol/L (ref 22–32)
Calcium: 9.2 mg/dL (ref 8.9–10.3)
Chloride: 101 mmol/L (ref 98–111)
Creatinine, Ser: 0.78 mg/dL (ref 0.44–1.00)
GFR calc Af Amer: >60 mL/min (ref >60)
GFR calc non Af Amer: >60 mL/min (ref >60)
Glucose, Bld: 99 mg/dL (ref 70–99)
Potassium: 2.9 mmol/L — ABNORMAL LOW (ref 3.5–5.1)
Sodium: 139 mmol/L (ref 135–145)
Total Bilirubin: 0.7 mg/dL (ref 0.3–1.2)
Total Protein: 8 g/dL (ref 6.5–8.1)

## 2020-07-25 LAB — I-STAT BETA HCG BLOOD, ED (MC, WL, AP ONLY): I-stat hCG, quantitative: <5 m[IU]/mL (ref <5)

## 2020-07-25 LAB — LIPASE, BLOOD: Lipase: 29 U/L (ref 11–51)

## 2020-07-25 MED ORDER — ACETAMINOPHEN 325 MG PO TABS
650.0000 mg | ORAL_TABLET | Freq: Once | ORAL | Status: AC
  Administered 2020-07-25: 650 mg via ORAL
  Filled 2020-07-25: qty 2

## 2020-07-25 MED ORDER — POTASSIUM CHLORIDE 10 MEQ/100ML IV SOLN
10.0000 meq | Freq: Once | INTRAVENOUS | Status: AC
  Administered 2020-07-25: 10 meq via INTRAVENOUS
  Filled 2020-07-25: qty 100

## 2020-07-25 MED ORDER — POTASSIUM CHLORIDE CRYS ER 20 MEQ PO TBCR
20.0000 meq | EXTENDED_RELEASE_TABLET | Freq: Every day | ORAL | 0 refills | Status: DC
Start: 2020-07-25 — End: 2021-06-19

## 2020-07-25 MED ORDER — POTASSIUM CHLORIDE CRYS ER 20 MEQ PO TBCR
40.0000 meq | EXTENDED_RELEASE_TABLET | Freq: Once | ORAL | Status: AC
  Administered 2020-07-25: 40 meq via ORAL
  Filled 2020-07-25: qty 2

## 2020-07-25 MED ORDER — SODIUM CHLORIDE 0.9 % IV BOLUS
1000.0000 mL | Freq: Once | INTRAVENOUS | Status: AC
  Administered 2020-07-25: 1000 mL via INTRAVENOUS

## 2020-07-25 MED ORDER — ONDANSETRON HCL 4 MG/2ML IJ SOLN
4.0000 mg | Freq: Once | INTRAMUSCULAR | Status: AC
  Administered 2020-07-25: 4 mg via INTRAVENOUS
  Filled 2020-07-25: qty 2

## 2020-07-25 NOTE — ED Triage Notes (Signed)
Pt here from home for eval of generalized body aches and weakness and diarrhea. Denies abdominal pain or n/v. Dx with covid on Monday.

## 2020-07-25 NOTE — ED Provider Notes (Signed)
[facility] EMERGENCY DEPARTMENT Provider Note   CSN: [id number] Arrival date & time: 07/25/20  1439     History Chief Complaint  Patient presents with  . Diarrhea    Tanya Moreno is a 29 y.o. female history of hypertension, here with body aches, weakness, diarrhea.  Patient states that she was diagnosed with Covid 4 days ago.  She states that since then she has been feeling achiness and weakness and diarrhea.  Also has some subjective fevers over the last 2 days as well.  Has some subjective shortness of breath as well.  Patient states that she has poor appetite and has some dry heaving.  Patient denies any underlying lung disease.  Patient did not get the Covid vaccine.  The history is provided by the patient.       Past Medical History:  Diagnosis Date  . Chlamydia   . Chlamydia infection affecting pregnancy in first trimester 02/09/2017   Positive on 02/08/17, azithromycin 1000 mg PO x 1 dose sent to pt pharmacy and pt notified by phone by [facility] staff Positive on 7/30  . Genital HSV    Last outbreak Nov 2013  . Gonorrhea   . Hypertension   . Trichomonas     Patient Active Problem List   Diagnosis Date Noted  . Chronic hypertension 07/22/2019  . Abnormal Papanicolaou smear of cervix with positive human papilloma virus (HPV) test on 12/30/18 01/08/2019  . Carpal tunnel syndrome during pregnancy 08/24/2017  . History of HSV 04/14/2017  . BMI 40.0-44.9, adult (HCC) 04/14/2017  . Latex allergy 08/26/2011    Past Surgical History:  Procedure Laterality Date  . NO PAST SURGERIES       OB History    Gravida  4   Para  4   Term  4   Preterm      AB      Living  4     SAB      TAB      Ectopic      Multiple  0   Live Births  4           No family history on file.  Social History   Tobacco Use  . Smoking status: Former Smoker    Quit date: 2015    Years since quitting: 6.6  . Smokeless tobacco: Former Neurosurgeon    Quit date:  11/10/2014  Vaping Use  . Vaping Use: Never used  Substance Use Topics  . Alcohol use: No    Alcohol/week: 0.0 standard drinks  . Drug use: No    Home Medications Prior to Admission medications   Medication Sig Start Date End Date Taking? Authorizing Provider  amLODipine (NORVASC) 10 MG tablet Take 1 tablet (10 mg total) by mouth daily. 06/17/20   Leftwich-Kirby, Wilmer Floor, CNM  cyclobenzaprine (FLEXERIL) 10 MG tablet Take 1 tablet (10 mg total) by mouth 2 (two) times daily as needed for muscle spasms. 07/19/20   Dahlia Byes A, NP  ibuprofen (ADVIL) 600 MG tablet Take 1 tablet (600 mg total) by mouth every 8 (eight) hours as needed for moderate pain. 07/19/20   Dahlia Byes A, NP  medroxyPROGESTERone (DEPO-PROVERA) 150 MG/ML injection Inject 1 mL (150 mg total) into the muscle every 3 (three) months. 07/18/20   Brock Bad, MD    Allergies    Latex and Tape  Review of Systems   Review of Systems  Gastrointestinal: Positive for diarrhea.  All other  systems reviewed and are negative.   Physical Exam Updated Vital Signs BP 124/82   Pulse (!) 101   Temp 99.8 F (37.7 C) (Oral)   Resp 18   Ht 5\' 4"  (1.626 m)   Wt 130.6 kg   SpO2 100%   BMI 49.44 kg/m   Physical Exam Vitals and nursing note reviewed.  HENT:     Head: Normocephalic.     Nose: Nose normal.     Mouth/Throat:     Mouth: Mucous membranes are dry.  Eyes:     Extraocular Movements: Extraocular movements intact.     Pupils: Pupils are equal, round, and reactive to light.  Cardiovascular:     Rate and Rhythm: Normal rate and regular rhythm.     Pulses: Normal pulses.     Heart sounds: Normal heart sounds.  Pulmonary:     Effort: Pulmonary effort is normal.     Comments: Dry crackles bilateral bases  Abdominal:     General: Abdomen is flat.     Palpations: Abdomen is soft.  Musculoskeletal:        General: Normal range of motion.     Cervical back: Normal range of motion.  Skin:    General: Skin is warm.       Capillary Refill: Capillary refill takes less than 2 seconds.  Neurological:     General: No focal deficit present.     Mental Status: She is alert and oriented to person, place, and time.  Psychiatric:        Mood and Affect: Mood normal.        Behavior: Behavior normal.     ED Results / Procedures / Treatments   Labs (all labs ordered are listed, but only abnormal results are displayed) Labs Reviewed  COMPREHENSIVE METABOLIC PANEL - Abnormal; Notable for the following components:      Result Value   Potassium 2.9 (*)    All other components within normal limits  LIPASE, BLOOD  CBC  URINALYSIS, ROUTINE W REFLEX MICROSCOPIC  I-STAT BETA HCG BLOOD, ED ([facility], [facility], [facility] ONLY)    EKG None  Radiology DG Chest Port 1 View  Result Date: 07/25/2020 CLINICAL DATA:  Shortness of breath EXAM: PORTABLE CHEST 1 VIEW COMPARISON:  12/14/2016 FINDINGS: Low lung volumes. Patchy left greater than right basilar airspace opacities. Borderline heart size. No pneumothorax. IMPRESSION: Low lung volumes with patchy left greater than right basilar airspace opacities, suspicious for bilateral pneumonia. Electronically Signed   By: 02/11/2017 M.D.   On: 07/25/2020 19:38    Procedures Procedures (including critical care time)  Medications Ordered in ED Medications  sodium chloride 0.9 % bolus 1,000 mL (has no administration in time range)  ondansetron (ZOFRAN) injection 4 mg (has no administration in time range)  acetaminophen (TYLENOL) tablet 650 mg (has no administration in time range)  potassium chloride SA (KLOR-CON) CR tablet 40 mEq (has no administration in time range)  potassium chloride 10 mEq in 100 mL IVPB (has no administration in time range)    ED Course  I have reviewed the triage vital signs and the nursing notes.  Pertinent labs & imaging results that were available during my care of the patient were reviewed by me and considered in my medical decision making (see chart for  details).    MDM Rules/Calculators/A&P                          07/27/2020  Tanya Moreno is a 29 y.o. female here presenting with body aches and weakness and diarrhea likely secondary to Covid.  Patient has normal oxygen level and has low-grade temp in the ED.  Plan to get CBC, CMP, chest x-ray.  Will hydrate patient and reassess.  10:39 PM Labs showed K 2.9, supplemented.  Chest x-ray showed Covid.  White blood cell count is normal.  Gave strict return precautions.  Will discharge patient with some potassium pills.  Tanya Moreno was evaluated in Emergency Department on 07/25/2020 for the symptoms described in the history of present illness. She was evaluated in the context of the global COVID-19 pandemic, which necessitated consideration that the patient might be at risk for infection with the SARS-CoV-2 virus that causes COVID-19. Institutional protocols and algorithms that pertain to the evaluation of patients at risk for COVID-19 are in a state of rapid change based on information released by regulatory bodies including the CDC and federal and state organizations. These policies and algorithms were followed during the patient's care in the ED.   Final Clinical Impression(s) / ED Diagnoses Final diagnoses:  None    Rx / DC Orders ED Discharge Orders    None       Charlynne Pander, MD 07/25/20 2240

## 2020-07-25 NOTE — ED Notes (Signed)
Patient verbalizes understanding of discharge instructions. Opportunity for questioning and answers were provided. Armband removed by staff, pt discharged from ED ambulatory.   

## 2020-07-25 NOTE — Discharge Instructions (Signed)
Take Tylenol or Motrin for fevers.  Your potassium is low. Take potassium pills daily for 3 days   Stay home for 10 days from your diagnosis   See your doctor   Return to ER if you have worse abdominal pain, vomiting, fever.      Person Under Monitoring Name: Tanya Moreno  Location: [address] [place] [address]   Infection Prevention Recommendations for Individuals Confirmed to have, or Being Evaluated for, 2019 Novel Coronavirus (COVID-19) Infection Who Receive Care at Home  Individuals who are confirmed to have, or are being evaluated for, COVID-19 should follow the prevention steps below until a healthcare provider or local or state health department says they can return to normal activities.  Stay home except to get medical care You should restrict activities outside your home, except for getting medical care. Do not go to work, school, or public areas, and do not use public transportation or taxis.  Call ahead before visiting your doctor Before your medical appointment, call the healthcare provider and tell them that you have, or are being evaluated for, COVID-19 infection. This will help the healthcare provider's office take steps to keep other people from getting infected. Ask your healthcare provider to call the local or state health department.  Monitor your symptoms Seek prompt medical attention if your illness is worsening (e.g., difficulty breathing). Before going to your medical appointment, call the healthcare provider and tell them that you have, or are being evaluated for, COVID-19 infection. Ask your healthcare provider to call the local or state health department.  Wear a facemask You should wear a facemask that covers your nose and mouth when you are in the same room with other people and when you visit a healthcare provider. People who live with or visit you should also wear a facemask while they are in the same room with  you.  Separate yourself from other people in your home As much as possible, you should stay in a different room from other people in your home. Also, you should use a separate bathroom, if available.  Avoid sharing household items You should not share dishes, drinking glasses, cups, eating utensils, towels, bedding, or other items with other people in your home. After using these items, you should wash them thoroughly with soap and water.  Cover your coughs and sneezes Cover your mouth and nose with a tissue when you cough or sneeze, or you can cough or sneeze into your sleeve. Throw used tissues in a lined trash can, and immediately wash your hands with soap and water for at least 20 seconds or use an alcohol-based hand rub.  Wash your [employer] your hands often and thoroughly with soap and water for at least 20 seconds. You can use an alcohol-based hand sanitizer if soap and water are not available and if your hands are not visibly dirty. Avoid touching your eyes, nose, and mouth with unwashed hands.   Prevention Steps for Caregivers and Household Members of Individuals Confirmed to have, or Being Evaluated for, COVID-19 Infection Being Cared for in the Home  If you live with, or provide care at home for, a person confirmed to have, or being evaluated for, COVID-19 infection please follow these guidelines to prevent infection:  Follow healthcare provider's instructions Make sure that you understand and can help the patient follow any healthcare provider instructions for all care.  Provide for the patient's basic needs You should help the patient with basic needs  in the home and provide support for getting groceries, prescriptions, and other personal needs.  Monitor the patient's symptoms If they are getting sicker, call his or her medical provider and tell them that the patient has, or is being evaluated for, COVID-19 infection. This will help the healthcare provider's office  take steps to keep other people from getting infected. Ask the healthcare provider to call the local or state health department.  Limit the number of people who have contact with the patient If possible, have only one caregiver for the patient. Other household members should stay in another home or place of residence. If this is not possible, they should stay in another room, or be separated from the patient as much as possible. Use a separate bathroom, if available. Restrict visitors who do not have an essential need to be in the home.  Keep older adults, very young children, and other sick people away from the patient Keep older adults, very young children, and those who have compromised immune systems or chronic health conditions away from the patient. This includes people with chronic heart, lung, or kidney conditions, diabetes, and cancer.  Ensure good ventilation Make sure that shared spaces in the home have good air flow, such as from an air conditioner or an opened window, weather permitting.  Wash your hands often Wash your hands often and thoroughly with soap and water for at least 20 seconds. You can use an alcohol based hand sanitizer if soap and water are not available and if your hands are not visibly dirty. Avoid touching your eyes, nose, and mouth with unwashed hands. Use disposable paper towels to dry your hands. If not available, use dedicated cloth towels and replace them when they become wet.  Wear a facemask and gloves Wear a disposable facemask at all times in the room and gloves when you touch or have contact with the patient's blood, body fluids, and/or secretions or excretions, such as sweat, saliva, sputum, nasal mucus, vomit, urine, or feces.  Ensure the mask fits over your nose and mouth tightly, and do not touch it during use. Throw out disposable facemasks and gloves after using them. Do not reuse. Wash your hands immediately after removing your facemask and  gloves. If your personal clothing becomes contaminated, carefully remove clothing and launder. Wash your hands after handling contaminated clothing. Place all used disposable facemasks, gloves, and other waste in a lined container before disposing them with other household waste. Remove gloves and wash your hands immediately after handling these items.  Do not share dishes, glasses, or other household items with the patient Avoid sharing household items. You should not share dishes, drinking glasses, cups, eating utensils, towels, bedding, or other items with a patient who is confirmed to have, or being evaluated for, COVID-19 infection. After the person uses these items, you should wash them thoroughly with soap and water.  Wash laundry thoroughly Immediately remove and wash clothes or bedding that have blood, body fluids, and/or secretions or excretions, such as sweat, saliva, sputum, nasal mucus, vomit, urine, or feces, on them. Wear gloves when handling laundry from the patient. Read and follow directions on labels of laundry or clothing items and detergent. In general, wash and dry with the warmest temperatures recommended on the label.  Clean all areas the individual has used often Clean all touchable surfaces, such as counters, tabletops, doorknobs, bathroom fixtures, toilets, phones, keyboards, tablets, and bedside tables, every day. Also, clean any surfaces that may have blood, body  fluids, and/or secretions or excretions on them. Wear gloves when cleaning surfaces the patient has come in contact with. Use a diluted bleach solution (e.g., dilute bleach with 1 part bleach and 10 parts water) or a household disinfectant with a label that says EPA-registered for coronaviruses. To make a bleach solution at home, add 1 tablespoon of bleach to 1 quart (4 cups) of water. For a larger supply, add  cup of bleach to 1 gallon (16 cups) of water. Read labels of cleaning products and follow  recommendations provided on product labels. Labels contain instructions for safe and effective use of the cleaning product including precautions you should take when applying the product, such as wearing gloves or eye protection and making sure you have good ventilation during use of the product. Remove gloves and wash hands immediately after cleaning.  Monitor yourself for signs and symptoms of illness Caregivers and household members are considered close contacts, should monitor their health, and will be asked to limit movement outside of the home to the extent possible. Follow the monitoring steps for close contacts listed on the symptom monitoring form.   ? If you have additional questions, contact your local health department or call the epidemiologist on call at [phone] (available 24/7). ? This guidance is subject to change. For the most up-to-date guidance from [facility], please refer to their website: [email or web address]

## 2020-07-25 NOTE — ED Notes (Signed)
IVT at bedside.

## 2020-07-26 ENCOUNTER — Ambulatory Visit: Payer: Medicaid Other

## 2020-07-29 ENCOUNTER — Ambulatory Visit: Payer: Medicaid Other | Admitting: Obstetrics and Gynecology

## 2020-08-08 ENCOUNTER — Ambulatory Visit (INDEPENDENT_AMBULATORY_CARE_PROVIDER_SITE_OTHER): Payer: Medicaid Other

## 2020-08-08 ENCOUNTER — Other Ambulatory Visit: Payer: Self-pay

## 2020-08-08 DIAGNOSIS — Z3042 Encounter for surveillance of injectable contraceptive: Secondary | ICD-10-CM

## 2020-08-08 LAB — POCT URINE PREGNANCY: Preg Test, Ur: NEGATIVE

## 2020-08-08 MED ORDER — MEDROXYPROGESTERONE ACETATE 150 MG/ML IM SUSP
150.0000 mg | INTRAMUSCULAR | Status: DC
  Administered 2020-08-08: 150 mg via INTRAMUSCULAR

## 2020-08-08 NOTE — Progress Notes (Signed)
Pt missed last depo injection due August 23rd, LMP estimated 07-11-20, UPT negative today, pt states she has not been sexually active since July. Pt is in the office for depo injection, administered in [place] and pt tolerated well, Next due Nov 18- Dec 2.  .. Administrations This Visit    medroxyPROGESTERone (DEPO-PROVERA) injection 150 mg    Admin Date 08/08/2020 Action Given Dose 150 mg Route Intramuscular Administered By Katrina Stack, RN

## 2020-08-23 ENCOUNTER — Ambulatory Visit: Payer: Medicaid Other | Admitting: Obstetrics and Gynecology

## 2020-09-02 ENCOUNTER — Ambulatory Visit: Payer: Medicaid Other | Admitting: Obstetrics & Gynecology

## 2020-10-28 ENCOUNTER — Ambulatory Visit: Payer: Medicaid Other

## 2020-11-03 ENCOUNTER — Encounter (HOSPITAL_COMMUNITY): Payer: Self-pay | Admitting: Emergency Medicine

## 2020-11-03 ENCOUNTER — Emergency Department (HOSPITAL_COMMUNITY)
Admission: EM | Admit: 2020-11-03 | Discharge: 2020-11-03 | Disposition: A | Discharge status: Home / Self Care | Payer: Medicaid Other | Attending: Emergency Medicine | Admitting: Emergency Medicine

## 2020-11-03 ENCOUNTER — Other Ambulatory Visit: Payer: Self-pay

## 2020-11-03 DIAGNOSIS — R519 Headache, unspecified: Secondary | ICD-10-CM | POA: Insufficient documentation

## 2020-11-03 DIAGNOSIS — Z91040 Latex allergy status: Secondary | ICD-10-CM | POA: Diagnosis not present

## 2020-11-03 DIAGNOSIS — Z87891 Personal history of nicotine dependence: Secondary | ICD-10-CM | POA: Diagnosis not present

## 2020-11-03 DIAGNOSIS — Z79899 Other long term (current) drug therapy: Secondary | ICD-10-CM | POA: Insufficient documentation

## 2020-11-03 DIAGNOSIS — I10 Essential (primary) hypertension: Secondary | ICD-10-CM | POA: Insufficient documentation

## 2020-11-03 MED ORDER — IBUPROFEN 600 MG PO TABS
600.0000 mg | ORAL_TABLET | Freq: Three times a day (TID) | ORAL | 0 refills | Status: DC | PRN
Start: 2020-11-03 — End: 2021-06-19

## 2020-11-03 MED ORDER — KETOROLAC TROMETHAMINE 30 MG/ML IJ SOLN
30.0000 mg | Freq: Once | INTRAMUSCULAR | Status: AC
  Administered 2020-11-03: 30 mg via INTRAMUSCULAR
  Filled 2020-11-03: qty 1

## 2020-11-03 NOTE — ED Notes (Signed)
Patient verbalizes understanding of discharge instructions. Opportunity for questioning and answers were provided. Armband removed by staff, pt discharged from ED ambulatory to home.  

## 2020-11-03 NOTE — ED Triage Notes (Signed)
Pt reports headache x 3-4 days.  Denies any associated symptoms.  No arm drift.  No neuro deficits.

## 2020-11-03 NOTE — ED Provider Notes (Signed)
EMERGENCY DEPARTMENT Provider Note  CSN: [id number] Arrival date & time: 11/03/20 1631    History Chief Complaint  Patient presents with  . Headache    HPI  Tanya Moreno is a 29 y.o. female with 3 days of moderate aching frontal headache, not associated with fever, congestion, rhinorrhea, photophobia, or nausea/vomiting. Not the worst headache of her life. Not sudden in onset.No neck pain or stiffness. She has taken APAP at home with some improvement.    Past Medical History:  Diagnosis Date  . Chlamydia   . Chlamydia infection affecting pregnancy in first trimester 02/09/2017   Positive on 02/08/17, azithromycin 1000 mg PO x 1 dose sent to pt pharmacy and pt notified by phone by [facility] staff Positive on 7/30  . Genital HSV    Last outbreak Nov 2013  . Gonorrhea   . Hypertension   . Trichomonas     Past Surgical History:  Procedure Laterality Date  . NO PAST SURGERIES      No family history on file.  Social History   Tobacco Use  . Smoking status: Former Smoker    Quit date: 2015    Years since quitting: 6.9  . Smokeless tobacco: Former Neurosurgeon    Quit date: 11/10/2014  Vaping Use  . Vaping Use: Never used  Substance Use Topics  . Alcohol use: No    Alcohol/week: 0.0 standard drinks  . Drug use: No     Home Medications Prior to Admission medications   Medication Sig Start Date End Date Taking? Authorizing Provider  amLODipine (NORVASC) 10 MG tablet Take 1 tablet (10 mg total) by mouth daily. 06/17/20   Leftwich-Kirby, Wilmer Floor, CNM  cyclobenzaprine (FLEXERIL) 10 MG tablet Take 1 tablet (10 mg total) by mouth 2 (two) times daily as needed for muscle spasms. Patient not taking: Reported on 08/08/2020 07/19/20   Dahlia Byes A, NP  ibuprofen (ADVIL) 600 MG tablet Take 1 tablet (600 mg total) by mouth every 8 (eight) hours as needed for moderate pain. Patient not taking: Reported on 08/08/2020 07/19/20   Dahlia Byes A, NP  medroxyPROGESTERone (DEPO-PROVERA) 150  MG/ML injection Inject 1 mL (150 mg total) into the muscle every 3 (three) months. Patient not taking: Reported on 08/08/2020 07/18/20   Brock Bad, MD  potassium chloride SA (KLOR-CON) 20 MEQ tablet Take 1 tablet (20 mEq total) by mouth daily. Patient not taking: Reported on 08/08/2020 07/25/20   Charlynne Pander, MD     Allergies    Latex and Tape   Review of Systems   Review of Systems A comprehensive review of systems was completed and negative except as noted in HPI.    Physical Exam BP 136/88 (BP Location: Right Arm)   Pulse 77   Temp 98.2 F (36.8 C) (Oral)   Resp 16   SpO2 98%   Physical Exam Vitals and nursing note reviewed.  Constitutional:      Appearance: Normal appearance.  HENT:     Head: Normocephalic and atraumatic.     Nose: Nose normal.     Mouth/Throat:     Mouth: Mucous membranes are moist.  Eyes:     Extraocular Movements: Extraocular movements intact.     Conjunctiva/sclera: Conjunctivae normal.  Cardiovascular:     Rate and Rhythm: Normal rate.  Pulmonary:     Effort: Pulmonary effort is normal.     Breath sounds: Normal breath sounds.  Abdominal:     General: Abdomen  is flat.     Palpations: Abdomen is soft.     Tenderness: There is no abdominal tenderness.  Musculoskeletal:        General: No swelling. Normal range of motion.     Cervical back: Neck supple.  Skin:    General: Skin is warm and dry.  Neurological:     General: No focal deficit present.     Mental Status: She is alert and oriented to person, place, and time.     Cranial Nerves: No cranial nerve deficit.     Sensory: No sensory deficit.  Psychiatric:        Mood and Affect: Mood normal.      ED Results / Procedures / Treatments   Labs (all labs ordered are listed, but only abnormal results are displayed) Labs Reviewed - No data to display  EKG None  Radiology No results found.  Procedures Procedures  Medications Ordered in the ED Medications    ketorolac (TORADOL) 30 MG/ML injection 30 mg (has no administration in time range)     MDM Rules/Calculators/A&P MDM Patient with headache, likely benign no red flags for intracranial process. Normal exam,no concern for meningitis or sinusitis. Plan Toradol IM in the ED for symptomatic treatment and Motrin as an outpatient.  ED Course  I have reviewed the triage vital signs and the nursing notes.  Pertinent labs & imaging results that were available during my care of the patient were reviewed by me and considered in my medical decision making (see chart for details).     Final Clinical Impression(s) / ED Diagnoses Final diagnoses:  Acute nonintractable headache, unspecified headache type    Rx / DC Orders ED Discharge Orders    None       Pollyann Savoy, MD 11/03/20 1911

## 2020-11-12 ENCOUNTER — Ambulatory Visit: Payer: Medicaid Other

## 2020-11-18 ENCOUNTER — Ambulatory Visit: Payer: Medicaid Other

## 2020-11-24 ENCOUNTER — Emergency Department (HOSPITAL_COMMUNITY)
Admission: EM | Admit: 2020-11-24 | Discharge: 2020-11-24 | Disposition: A | Discharge status: Home / Self Care | Payer: Medicaid Other | Attending: Emergency Medicine | Admitting: Emergency Medicine

## 2020-11-24 ENCOUNTER — Other Ambulatory Visit: Payer: Self-pay

## 2020-11-24 ENCOUNTER — Emergency Department (HOSPITAL_COMMUNITY): Payer: Medicaid Other

## 2020-11-24 ENCOUNTER — Encounter (HOSPITAL_COMMUNITY): Payer: Self-pay | Admitting: Emergency Medicine

## 2020-11-24 DIAGNOSIS — N760 Acute vaginitis: Secondary | ICD-10-CM | POA: Diagnosis not present

## 2020-11-24 DIAGNOSIS — I10 Essential (primary) hypertension: Secondary | ICD-10-CM | POA: Diagnosis not present

## 2020-11-24 DIAGNOSIS — E876 Hypokalemia: Secondary | ICD-10-CM

## 2020-11-24 DIAGNOSIS — R112 Nausea with vomiting, unspecified: Secondary | ICD-10-CM | POA: Diagnosis not present

## 2020-11-24 DIAGNOSIS — Z91040 Latex allergy status: Secondary | ICD-10-CM | POA: Insufficient documentation

## 2020-11-24 DIAGNOSIS — R1030 Lower abdominal pain, unspecified: Secondary | ICD-10-CM

## 2020-11-24 DIAGNOSIS — Z79899 Other long term (current) drug therapy: Secondary | ICD-10-CM | POA: Insufficient documentation

## 2020-11-24 DIAGNOSIS — B9689 Other specified bacterial agents as the cause of diseases classified elsewhere: Secondary | ICD-10-CM | POA: Insufficient documentation

## 2020-11-24 DIAGNOSIS — Z87891 Personal history of nicotine dependence: Secondary | ICD-10-CM | POA: Diagnosis not present

## 2020-11-24 LAB — URINALYSIS, ROUTINE W REFLEX MICROSCOPIC
Bilirubin Urine: NEGATIVE
Glucose, UA: NEGATIVE mg/dL
Ketones, ur: NEGATIVE mg/dL
Nitrite: NEGATIVE
Protein, ur: 100 mg/dL — AB
Specific Gravity, Urine: 1.02 (ref 1.005–1.030)
WBC, UA: >50 WBC/hpf — ABNORMAL HIGH (ref 0–5)
pH: 6 (ref 5.0–8.0)

## 2020-11-24 LAB — COMPREHENSIVE METABOLIC PANEL
ALT: 19 U/L (ref 0–44)
AST: 14 U/L — ABNORMAL LOW (ref 15–41)
Albumin: 4 g/dL (ref 3.5–5.0)
Alkaline Phosphatase: 95 U/L (ref 38–126)
Anion gap: 11 (ref 5–15)
BUN: 8 mg/dL (ref 6–20)
CO2: 24 mmol/L (ref 22–32)
Calcium: 9.7 mg/dL (ref 8.9–10.3)
Chloride: 107 mmol/L (ref 98–111)
Creatinine, Ser: 0.75 mg/dL (ref 0.44–1.00)
GFR, Estimated: >60 mL/min (ref >60)
Glucose, Bld: 104 mg/dL — ABNORMAL HIGH (ref 70–99)
Potassium: 2.9 mmol/L — ABNORMAL LOW (ref 3.5–5.1)
Sodium: 142 mmol/L (ref 135–145)
Total Bilirubin: 0.5 mg/dL (ref 0.3–1.2)
Total Protein: 7.5 g/dL (ref 6.5–8.1)

## 2020-11-24 LAB — WET PREP, GENITAL
Sperm: NONE SEEN
Trich, Wet Prep: NONE SEEN
Yeast Wet Prep HPF POC: NONE SEEN

## 2020-11-24 LAB — CBC
HCT: 43.9 % (ref 36.0–46.0)
Hemoglobin: 14.5 g/dL (ref 12.0–15.0)
MCH: 28.7 pg (ref 26.0–34.0)
MCHC: 33 g/dL (ref 30.0–36.0)
MCV: 86.8 fL (ref 80.0–100.0)
Platelets: 345 10*3/uL (ref 150–400)
RBC: 5.06 MIL/uL (ref 3.87–5.11)
RDW: 12.9 % (ref 11.5–15.5)
WBC: 5.1 10*3/uL (ref 4.0–10.5)
nRBC: 0 % (ref 0.0–0.2)

## 2020-11-24 LAB — I-STAT BETA HCG BLOOD, ED (MC, WL, AP ONLY): I-stat hCG, quantitative: <5 m[IU]/mL (ref <5)

## 2020-11-24 LAB — LIPASE, BLOOD: Lipase: 25 U/L (ref 11–51)

## 2020-11-24 MED ORDER — ONDANSETRON 4 MG PO TBDP: 4.0000 mg | ORAL_TABLET | Freq: Three times a day (TID) | ORAL | 0 refills | Status: DC | PRN

## 2020-11-24 MED ORDER — POTASSIUM CHLORIDE CRYS ER 20 MEQ PO TBCR: 20.0000 meq | EXTENDED_RELEASE_TABLET | Freq: Every day | ORAL | 0 refills | Status: DC

## 2020-11-24 MED ORDER — SODIUM CHLORIDE 0.9 % IV BOLUS
1000.0000 mL | Freq: Once | INTRAVENOUS | Status: AC
  Administered 2020-11-24: 17:00:00 1000 mL via INTRAVENOUS

## 2020-11-24 MED ORDER — POTASSIUM CHLORIDE CRYS ER 20 MEQ PO TBCR
40.0000 meq | EXTENDED_RELEASE_TABLET | Freq: Once | ORAL | Status: AC
  Administered 2020-11-24: 17:00:00 40 meq via ORAL
  Filled 2020-11-24: qty 2

## 2020-11-24 MED ORDER — METRONIDAZOLE 500 MG PO TABS: 500.0000 mg | ORAL_TABLET | Freq: Two times a day (BID) | ORAL | 0 refills | Status: DC

## 2020-11-24 MED ORDER — IOHEXOL 300 MG/ML  SOLN
100.0000 mL | Freq: Once | INTRAMUSCULAR | Status: AC | PRN
  Administered 2020-11-24: 18:00:00 100 mL via INTRAVENOUS

## 2020-11-24 NOTE — ED Triage Notes (Signed)
C/o lower abd pain x 3 days with nausea.  Denies vomiting and diarrhea.

## 2020-11-24 NOTE — Discharge Instructions (Addendum)
As discussed, all of your labs are reassuring today.  Your CT abdomen did not show any acute abnormalities. Your wet prep showed you have bacterial vaginosis.  I am sending you home with an antibiotic.  Take as prescribed and finish all antibiotics.  Do not drink alcohol while on the medication.  Also sending home with nausea medication.  Take as needed.  Please follow-up with PCP if symptoms not improved within the next week.  Your gonorrhea and Chlamydia test are pending.  You will be notified if they are positive. Continue to use protection during intercourse. Return to the ER for new or worsening symptoms.   Your potassium was also low here in the ER.  You were given potassium while in the ER. I am sending you home with potassium.  Take once a day for 5 days.  Please have your potassium rechecked by your PCP within the next week.

## 2020-11-24 NOTE — ED Provider Notes (Signed)
[facility] EMERGENCY DEPARTMENT Provider Note   CSN: [id number] Arrival date & time: 11/24/20  1110     History Chief Complaint  Patient presents with  . Abdominal Pain    Tanya Moreno is a 29 y.o. female with a past medical history significant for hypertension who presents to the ED due to bilateral lower abdominal pain x3 days associated with nausea vomiting.  Patient admits to 3 episodes of nonbloody, nonbilious emesis daily x 3 days.  Denies urinary vaginal symptoms.  Denies any concerns for STDs at this time.  She is currently sexually active with one partner without protection.  Denies fever and chills.  No previous abdominal operations.  She is taking Tylenol with moderate relief.  No recent antibiotics or ingestion of undercooked foods.  No sick contacts or known Covid exposures.  No aggravating or alleviating factors.  History obtained from patient and past medical records. No interpreter used during encounter.      Past Medical History:  Diagnosis Date  . Chlamydia   . Chlamydia infection affecting pregnancy in first trimester 02/09/2017   Positive on 02/08/17, azithromycin 1000 mg PO x 1 dose sent to pt pharmacy and pt notified by phone by [facility] staff Positive on 7/30  . Genital HSV    Last outbreak Nov 2013  . Gonorrhea   . Hypertension   . Trichomonas     Patient Active Problem List   Diagnosis Date Noted  . Chronic hypertension 07/22/2019  . Abnormal Papanicolaou smear of cervix with positive human papilloma virus (HPV) test on 12/30/18 01/08/2019  . Carpal tunnel syndrome during pregnancy 08/24/2017  . History of HSV 04/14/2017  . BMI 40.0-44.9, adult (HCC) 04/14/2017  . Latex allergy 08/26/2011    Past Surgical History:  Procedure Laterality Date  . NO PAST SURGERIES       OB History    Gravida  4   Para  4   Term  4   Preterm      AB      Living  4     SAB      IAB      Ectopic      Multiple  0   Live Births  4            No family history on file.  Social History   Tobacco Use  . Smoking status: Former Smoker    Quit date: 2015    Years since quitting: 6.9  . Smokeless tobacco: Former Neurosurgeon    Quit date: 11/10/2014  Vaping Use  . Vaping Use: Never used  Substance Use Topics  . Alcohol use: No    Alcohol/week: 0.0 standard drinks  . Drug use: No    Home Medications Prior to Admission medications   Medication Sig Start Date End Date Taking? Authorizing Provider  amLODipine (NORVASC) 10 MG tablet Take 1 tablet (10 mg total) by mouth daily. 06/17/20   Leftwich-Kirby, Wilmer Floor, CNM  cyclobenzaprine (FLEXERIL) 10 MG tablet Take 1 tablet (10 mg total) by mouth 2 (two) times daily as needed for muscle spasms. Patient not taking: Reported on 08/08/2020 07/19/20   Dahlia Byes A, NP  ibuprofen (ADVIL) 600 MG tablet Take 1 tablet (600 mg total) by mouth every 8 (eight) hours as needed for moderate pain. 11/03/20   Pollyann Savoy, MD  medroxyPROGESTERone (DEPO-PROVERA) 150 MG/ML injection Inject 1 mL (150 mg total) into the muscle every 3 (three) months. Patient not taking:  Reported on 08/08/2020 07/18/20   Brock Bad, MD  metroNIDAZOLE (FLAGYL) 500 MG tablet Take 1 tablet (500 mg total) by mouth 2 (two) times daily. 11/24/20   Mannie Stabile, PA-C  ondansetron (ZOFRAN ODT) 4 MG disintegrating tablet Take 1 tablet (4 mg total) by mouth every 8 (eight) hours as needed for nausea or vomiting. 11/24/20   Audelia Acton, Merla Riches, PA-C  potassium chloride SA (KLOR-CON) 20 MEQ tablet Take 1 tablet (20 mEq total) by mouth daily. Patient not taking: Reported on 08/08/2020 07/25/20   Charlynne Pander, MD  potassium chloride SA (KLOR-CON) 20 MEQ tablet Take 1 tablet (20 mEq total) by mouth daily for 5 days. 11/24/20 11/29/20  Mannie Stabile, PA-C    Allergies    Latex and Tape  Review of Systems   Review of Systems  Constitutional: Negative for chills and fever.  Respiratory: Negative for shortness  of breath.   Cardiovascular: Negative for chest pain.  Gastrointestinal: Positive for abdominal pain, nausea and vomiting. Negative for diarrhea.  Genitourinary: Negative for dysuria, vaginal discharge and vaginal pain.  All other systems reviewed and are negative.   Physical Exam Updated Vital Signs BP 128/71 (BP Location: Left Arm)   Pulse (!) 16   Temp 99 F (37.2 C) (Oral)   Resp 16   Ht 5\' 4"  (1.626 m)   Wt 129.7 kg   LMP 10/28/2020   SpO2 99%   BMI 49.09 kg/m   Physical Exam Vitals and nursing note reviewed.  Constitutional:      General: She is not in acute distress.    Appearance: She is not ill-appearing.  HENT:     Head: Normocephalic.  Eyes:     Pupils: Pupils are equal, round, and reactive to light.  Cardiovascular:     Rate and Rhythm: Normal rate and regular rhythm.     Pulses: Normal pulses.     Heart sounds: Normal heart sounds. No murmur heard. No friction rub. No gallop.   Pulmonary:     Effort: Pulmonary effort is normal.     Breath sounds: Normal breath sounds.  Abdominal:     General: Abdomen is flat. Bowel sounds are normal. There is no distension.     Palpations: Abdomen is soft.     Tenderness: There is abdominal tenderness. There is no guarding or rebound.     Comments: Bilateral lower abdominal tenderness.  No rebound or guarding.  Negative CVA tenderness bilaterally.  Genitourinary:    Comments: Pelvic performed with chaperone in room.  Mild amount of blood in vaginal vault.  No discharge.  No CMT.  No adnexal tenderness or masses. Musculoskeletal:     Cervical back: Neck supple.     Comments: Able to move all 4 extremities without difficulty.   Skin:    General: Skin is warm and dry.  Neurological:     General: No focal deficit present.     Mental Status: She is alert.  Psychiatric:        Mood and Affect: Mood normal.        Behavior: Behavior normal.     ED Results / Procedures / Treatments   Labs (all labs ordered are listed,  but only abnormal results are displayed) Labs Reviewed  WET PREP, GENITAL - Abnormal; Notable for the following components:      Result Value   Clue Cells Wet Prep HPF POC PRESENT (*)    WBC, Wet Prep HPF POC MANY (*)  All other components within normal limits  COMPREHENSIVE METABOLIC PANEL - Abnormal; Notable for the following components:   Potassium 2.9 (*)    Glucose, Bld 104 (*)    AST 14 (*)    All other components within normal limits  URINALYSIS, ROUTINE W REFLEX MICROSCOPIC - Abnormal; Notable for the following components:   APPearance CLOUDY (*)    Hgb urine dipstick LARGE (*)    Protein, ur 100 (*)    Leukocytes,Ua LARGE (*)    WBC, UA >50 (*)    Bacteria, UA RARE (*)    All other components within normal limits  URINE CULTURE  LIPASE, BLOOD  CBC  I-STAT BETA HCG BLOOD, ED ([facility], [facility], [facility] ONLY)  GC/CHLAMYDIA PROBE AMP () NOT AT [facility]    EKG None  Radiology CT ABDOMEN PELVIS W CONTRAST  Result Date: 11/24/2020 CLINICAL DATA:  Right lower quadrant abdominal pain for 3 days, nausea EXAM: CT ABDOMEN AND PELVIS WITH CONTRAST TECHNIQUE: Multidetector CT imaging of the abdomen and pelvis was performed using the standard protocol following bolus administration of intravenous contrast. CONTRAST:  100mL OMNIPAQUE IOHEXOL 300 MG/ML  SOLN COMPARISON:  None. FINDINGS: Lower chest: No acute pleural or parenchymal lung disease. Hepatobiliary: Hypodensity right lobe liver along the falciform ligament consistent with focal fatty infiltration or flow related phenomenon. No for other focal liver abnormalities. No intrahepatic duct dilation. The gallbladder is unremarkable. Pancreas: Unremarkable. No pancreatic ductal dilatation or surrounding inflammatory changes. Spleen: Normal in size without focal abnormality. Adrenals/Urinary Tract: Adrenal glands are unremarkable. Kidneys are normal, without renal calculi, focal lesion, or hydronephrosis. Bladder is unremarkable. Stomach/Bowel:  No bowel obstruction or ileus. Normal appendix right lower quadrant. No bowel wall thickening or inflammatory change. Vascular/Lymphatic: No significant vascular findings are present. No enlarged abdominal or pelvic lymph nodes. Reproductive: Uterus and bilateral adnexa are unremarkable. Other: No free fluid or free gas.  No abdominal wall hernia. Musculoskeletal: No acute or destructive bony lesions. Reconstructed images demonstrate no additional findings. IMPRESSION: 1. Normal appendix. No acute intra-abdominal or intrapelvic process. Electronically Signed   By: Sharlet SalinaMichael  Brown M.D.   On: 11/24/2020 18:38    Procedures Procedures (including critical care time)  Medications Ordered in ED Medications  sodium chloride 0.9 % bolus 1,000 mL (0 mLs Intravenous Stopped 11/24/20 1920)  potassium chloride SA (KLOR-CON) CR tablet 40 mEq (40 mEq Oral Given 11/24/20 1724)  iohexol (OMNIPAQUE) 300 MG/ML solution 100 mL (100 mLs Intravenous Contrast Given 11/24/20 1748)    ED Course  I have reviewed the triage vital signs and the nursing notes.  Pertinent labs & imaging results that were available during my care of the patient were reviewed by me and considered in my medical decision making (see chart for details).  Clinical Course as of 11/24/20 2001  Sun Nov 24, 2020  1700 Potassium(!): 2.9 [CA]  1700 Leukocytes,Ua(!): LARGE [CA]  1700 Bacteria, UA(!): RARE [CA]  1700 Hgb urine dipstick(!): LARGE [CA]  1701 I-stat hCG, quantitative: <5.0 [CA]  1922 Clue Cells Wet Prep HPF POC(!): PRESENT [CA]  1957 WBC, Wet Prep HPF POC(!): MANY [CA]    Clinical Course User Index [CA] Mannie StabileAberman, Lyric Hoar C, PA-C   MDM Rules/Calculators/A&P                         29 year old female presents to the ED due to bilateral lower abdominal pain associate with nausea vomiting x3 days.  Patient denies urinary vaginal symptoms.  No concern for STDs at this time.  Upon arrival, patient is afebrile, not tachycardic or  hypoxic.  Elevated BP 156/109.  Low suspicion for hypertensive urgency/emergency.  Patient in no acute distress and nontoxic-appearing.  Physical exam significant for bilateral lower abdominal tenderness.  No rebound or guarding.  Negative CVA tenderness bilaterally.  Routine labs ordered at triage.  Given there is tenderness in the right lower quadrant will obtain CT abdomen to rule out appendicitis and other intra-abdominal etiologies.  IV fluids given. Will also need to perform pelvic exam to rule out pelvic etiology.   CBC unremarkable no leukocytosis and normal hemoglobin.  Pregnancy test negative.  Lipase normal at 25.  Doubt pancreatitis.  CMP significant for hypokalemia at 2.9, but otherwise reassuring.  Potassium repleted here in the ED.  UA significant for large hematuria, leukocytes, and rare bacteria. 21-50 squamous cells. Suspect contamination. Urine culture pending. Patient has no urinary symptoms do will hold antibiotics at this time. CT abdomen personally reviewed which is negative for any acute abnormalities. Pelvic performed with chaperone in room.  Mild amount of blood in vaginal vault.  No discharge.  No CMT.  Low suspicion for PID. No adnexal tenderness or masses.  Wet prep positive for clue cells and white blood cells.  Will treat patient for bacterial vaginosis.  Gonorrhea/chlamydia cultures pending.  Instructed patient that she will be notified if results are positive.  Patient would not like to be prophylactically treated at this time given her suspicion is low. Patient discharged with Flagyl, Zofran, and potassium.  Instructed patient to follow-up with PCP within the next week to have potassium rechecked. Strict ED precautions discussed with patient. Patient states understanding and agrees to plan. Patient discharged home in no acute distress and stable vitals. Final Clinical Impression(s) / ED Diagnoses Final diagnoses:  Lower abdominal pain  Non-intractable vomiting with nausea,  unspecified vomiting type  Bacterial vaginosis  Hypokalemia    Rx / DC Orders ED Discharge Orders         Ordered    ondansetron (ZOFRAN ODT) 4 MG disintegrating tablet  Every 8 hours PRN        11/24/20 1921    metroNIDAZOLE (FLAGYL) 500 MG tablet  2 times daily        11/24/20 1957    potassium chloride SA (KLOR-CON) 20 MEQ tablet  Daily        11/24/20 2001           Jesusita Oka 11/24/20 Gala Lewandowsky, MD 11/28/20 1127

## 2020-11-25 LAB — GC/CHLAMYDIA PROBE AMP (~~LOC~~) NOT AT ARMC
Chlamydia: POSITIVE — AB
Comment: NEGATIVE
Comment: NORMAL
Neisseria Gonorrhea: POSITIVE — AB

## 2020-11-26 LAB — URINE CULTURE

## 2020-12-13 ENCOUNTER — Emergency Department (HOSPITAL_COMMUNITY)
Admission: EM | Admit: 2020-12-13 | Discharge: 2020-12-14 | Discharge status: Against Medical Advice | Payer: Medicaid Other

## 2020-12-14 NOTE — ED Notes (Signed)
Pt called multiple times for triage with no response.

## 2021-01-09 ENCOUNTER — Ambulatory Visit: Payer: Medicaid Other

## 2021-01-13 ENCOUNTER — Ambulatory Visit (INDEPENDENT_AMBULATORY_CARE_PROVIDER_SITE_OTHER): Payer: Medicaid Other | Admitting: *Deleted

## 2021-01-13 ENCOUNTER — Other Ambulatory Visit: Payer: Self-pay

## 2021-01-13 DIAGNOSIS — N926 Irregular menstruation, unspecified: Secondary | ICD-10-CM

## 2021-01-13 LAB — POCT URINE PREGNANCY: Preg Test, Ur: NEGATIVE

## 2021-01-13 NOTE — Progress Notes (Addendum)
Tanya Moreno presents today for UPT. She has no unusual complaints.  LMP:11/08/20    OBJECTIVE: Appears well, in no apparent distress.  OB History    Gravida  4   Para  4   Term  4   Preterm      AB      Living  4     SAB      IAB      Ectopic      Multiple  0   Live Births  4          Home UPT Result:Positive in January. In-Office UPT result:Negative x 2  I have reviewed the patient's medical, obstetrical, social, and family histories, and medications.   ASSESSMENT: Negative pregnancy test  PLAN: Per Dr Clearance Coots- Monitor for cycle over the next 4 weeks. If no cycle, may need Rx to initiate.  Advised may make appt in 4 weeks if no cycle.  Patient was assessed and managed by nursing staff during this encounter. I have reviewed the chart and agree with the documentation and plan. I have also made any necessary editorial changes.  Coral Ceo, MD 01/13/2021 10:53 PM

## 2021-01-24 ENCOUNTER — Encounter: Payer: Medicaid Other | Admitting: Obstetrics

## 2021-01-29 ENCOUNTER — Telehealth: Payer: Self-pay | Admitting: Obstetrics

## 2021-02-03 ENCOUNTER — Other Ambulatory Visit: Payer: Self-pay

## 2021-02-03 ENCOUNTER — Encounter (HOSPITAL_COMMUNITY): Payer: Self-pay | Admitting: Emergency Medicine

## 2021-02-03 ENCOUNTER — Ambulatory Visit (HOSPITAL_COMMUNITY)
Admission: EM | Admit: 2021-02-03 | Discharge: 2021-02-03 | Disposition: A | Discharge status: Home / Self Care | Payer: Medicaid Other | Attending: Emergency Medicine | Admitting: Emergency Medicine

## 2021-02-03 DIAGNOSIS — Z3202 Encounter for pregnancy test, result negative: Secondary | ICD-10-CM | POA: Diagnosis not present

## 2021-02-03 DIAGNOSIS — N939 Abnormal uterine and vaginal bleeding, unspecified: Secondary | ICD-10-CM | POA: Diagnosis not present

## 2021-02-03 LAB — POC URINE PREG, ED: Preg Test, Ur: NEGATIVE

## 2021-02-03 NOTE — ED Triage Notes (Signed)
Pt states that she has a confirmed pregnancy at [facility] three weeks ago. Pt states that she noticed some bleeding last night that became heavy and now still has consistent bleeding.

## 2021-02-03 NOTE — ED Provider Notes (Signed)
[facility]    CSN: [id number] Arrival date & time: 02/03/21  1650      History   Chief Complaint Chief Complaint  Patient presents with  . Vaginal Bleeding  . Abdominal Pain    HPI Tanya Moreno is a 30 y.o. female.   Patient presents with lower abdominal cramping pain 6/10 and vaginal bleeding starting today. Noticed blood clots when wiping and blood in toilet. Pregnancy test on 2/7 in office positive per patient. LMP 11/2020. Denies discharge, itching, odor, frequency, urgency, flank pain, back pain, diarrhea, constipation, fever, chills.   Past Medical History:  Diagnosis Date  . Chlamydia   . Chlamydia infection affecting pregnancy in first trimester 02/09/2017   Positive on 02/08/17, azithromycin 1000 mg PO x 1 dose sent to pt pharmacy and pt notified by phone by [facility] staff Positive on 7/30  . Genital HSV    Last outbreak Nov 2013  . Gonorrhea   . Hypertension   . Trichomonas     Patient Active Problem List   Diagnosis Date Noted  . Chronic hypertension 07/22/2019  . Abnormal Papanicolaou smear of cervix with positive human papilloma virus (HPV) test on 12/30/18 01/08/2019  . Carpal tunnel syndrome during pregnancy 08/24/2017  . History of HSV 04/14/2017  . BMI 40.0-44.9, adult (HCC) 04/14/2017  . Latex allergy 08/26/2011    Past Surgical History:  Procedure Laterality Date  . NO PAST SURGERIES      OB History    Gravida  4   Para  4   Term  4   Preterm      AB      Living  4     SAB      IAB      Ectopic      Multiple  0   Live Births  4            Home Medications    Prior to Admission medications   Medication Sig Start Date End Date Taking? Authorizing Provider  labetalol (NORMODYNE) 100 MG tablet Take 100 mg by mouth 2 (two) times daily.   Yes [provider]  amLODipine (NORVASC) 10 MG tablet Take 1 tablet (10 mg total) by mouth daily. 06/17/20   Leftwich-Kirby, Wilmer Floor, CNM  cyclobenzaprine (FLEXERIL) 10 MG  tablet Take 1 tablet (10 mg total) by mouth 2 (two) times daily as needed for muscle spasms. Patient not taking: No sig reported 07/19/20   Dahlia Byes A, NP  ibuprofen (ADVIL) 600 MG tablet Take 1 tablet (600 mg total) by mouth every 8 (eight) hours as needed for moderate pain. 11/03/20   Pollyann Savoy, MD  medroxyPROGESTERone (DEPO-PROVERA) 150 MG/ML injection Inject 1 mL (150 mg total) into the muscle every 3 (three) months. Patient not taking: No sig reported 07/18/20   Brock Bad, MD  metroNIDAZOLE (FLAGYL) 500 MG tablet Take 1 tablet (500 mg total) by mouth 2 (two) times daily. 11/24/20   Mannie Stabile, PA-C  ondansetron (ZOFRAN ODT) 4 MG disintegrating tablet Take 1 tablet (4 mg total) by mouth every 8 (eight) hours as needed for nausea or vomiting. 11/24/20   Audelia Acton, Merla Riches, PA-C  potassium chloride SA (KLOR-CON) 20 MEQ tablet Take 1 tablet (20 mEq total) by mouth daily. Patient not taking: No sig reported 07/25/20   Charlynne Pander, MD  potassium chloride SA (KLOR-CON) 20 MEQ tablet Take 1 tablet (20 mEq total) by mouth daily for 5 days. 11/24/20  11/29/20  Mannie Stabile, PA-C    Family History History reviewed. No pertinent family history.  Social History Social History   Tobacco Use  . Smoking status: Former Smoker    Quit date: 2015    Years since quitting: 7.1  . Smokeless tobacco: Former Neurosurgeon    Quit date: 11/10/2014  Vaping Use  . Vaping Use: Never used  Substance Use Topics  . Alcohol use: No    Alcohol/week: 0.0 standard drinks  . Drug use: No     Allergies   Latex and Tape   Review of Systems Review of Systems  Constitutional: Negative.   HENT: Negative.   Respiratory: Negative.   Cardiovascular: Negative.   Gastrointestinal: Positive for abdominal pain and anal bleeding. Negative for abdominal distention, blood in stool, constipation, diarrhea, nausea, rectal pain and vomiting.  Genitourinary: Positive for vaginal bleeding and  vaginal pain. Negative for decreased urine volume, difficulty urinating, dyspareunia, dysuria, enuresis, flank pain, frequency, genital sores, hematuria, menstrual problem, pelvic pain, urgency and vaginal discharge.  Musculoskeletal: Negative.   Skin: Negative.   Neurological: Negative.      Physical Exam Triage Vital Signs ED Triage Vitals  Enc Vitals Group     BP 02/03/21 1743 132/89     Pulse Rate 02/03/21 1743 88     Resp 02/03/21 1743 18     Temp 02/03/21 1743 98.4 F (36.9 C)     Temp Source 02/03/21 1743 Oral     SpO2 02/03/21 1743 100 %     Weight --      Height --      Head Circumference --      Peak Flow --      Pain Score 02/03/21 1740 6     Pain Loc --      Pain Edu? --      Excl. in GC? --    No data found.  Updated Vital Signs BP 132/89 (BP Location: Left Arm)   Pulse 88   Temp 98.4 F (36.9 C) (Oral)   Resp 18   LMP 11/11/2020 (Approximate)   SpO2 100%   Breastfeeding No   Visual Acuity Right Eye Distance:   Left Eye Distance:   Bilateral Distance:    Right Eye Near:   Left Eye Near:    Bilateral Near:     Physical Exam Constitutional:      Appearance: She is well-developed. She is obese.  Eyes:     Extraocular Movements: Extraocular movements intact.  Pulmonary:     Effort: Pulmonary effort is normal.  Abdominal:     General: Abdomen is flat. Bowel sounds are normal.     Palpations: Abdomen is soft.     Tenderness: There is abdominal tenderness in the suprapubic area. There is no right CVA tenderness, left CVA tenderness, guarding or rebound.  Skin:    General: Skin is warm and dry.  Neurological:     General: No focal deficit present.     Mental Status: She is alert and oriented to person, place, and time.  Psychiatric:        Mood and Affect: Mood normal.        Behavior: Behavior normal.      UC Treatments / Results  Labs (all labs ordered are listed, but only abnormal results are displayed) Labs Reviewed  POC URINE PREG,  ED    EKG   Radiology No results found.  Procedures Procedures (including critical care time)  Medications Ordered  in UC Medications - No data to display  Initial Impression / Assessment and Plan / UC Course  I have reviewed the triage vital signs and the nursing notes.  Pertinent labs & imaging results that were available during my care of the patient were reviewed by me and considered in my medical decision making (see chart for details).  Abnormal uterine bleeding  Per records pregnancy test on 2/7 negative, patient supposed to be watching for period over next 4 weeks, had abnormal cells on PAP and needs a colposcopy, I believe current vaginal bleeding is menses   1. Urine pregnancy- negative 2. Discussed with patient need for colposcopy, verbalized understanding, to follow up with gynecology  Final Clinical Impressions(s) / UC Diagnoses   Final diagnoses:  Abnormal uterine bleeding     Discharge Instructions     Follow at gynecology for cervical check   Can take 400 mg ibuprofen every 4 hours as needed for comfort  Can use heating pad in 15 minute intervals for comfort    ED Prescriptions    None     PDMP not reviewed this encounter.   Valinda Hoar, [place] 02/03/21 [phone]

## 2021-02-03 NOTE — Discharge Instructions (Addendum)
Follow at gynecology for cervical check   Can take 400 mg ibuprofen every 4 hours as needed for comfort  Can use heating pad in 15 minute intervals for comfort

## 2021-02-05 ENCOUNTER — Ambulatory Visit (HOSPITAL_COMMUNITY)
Admission: EM | Admit: 2021-02-05 | Discharge: 2021-02-05 | Disposition: A | Discharge status: Home / Self Care | Payer: Medicaid Other | Attending: Emergency Medicine | Admitting: Emergency Medicine

## 2021-02-05 ENCOUNTER — Other Ambulatory Visit: Payer: Self-pay

## 2021-02-05 ENCOUNTER — Encounter (HOSPITAL_COMMUNITY): Payer: Self-pay

## 2021-02-05 DIAGNOSIS — R82998 Other abnormal findings in urine: Secondary | ICD-10-CM | POA: Diagnosis not present

## 2021-02-05 DIAGNOSIS — Z3202 Encounter for pregnancy test, result negative: Secondary | ICD-10-CM

## 2021-02-05 DIAGNOSIS — R1030 Lower abdominal pain, unspecified: Secondary | ICD-10-CM | POA: Diagnosis not present

## 2021-02-05 LAB — POCT URINALYSIS DIPSTICK, ED / UC
Glucose, UA: NEGATIVE mg/dL
Ketones, ur: NEGATIVE mg/dL
Nitrite: NEGATIVE
Protein, ur: 100 mg/dL — AB
Specific Gravity, Urine: 1.025 (ref 1.005–1.030)
Urobilinogen, UA: 0.2 mg/dL (ref 0.0–1.0)
pH: 6 (ref 5.0–8.0)

## 2021-02-05 LAB — POC URINE PREG, ED: Preg Test, Ur: NEGATIVE

## 2021-02-05 MED ORDER — CEPHALEXIN 500 MG PO CAPS: 500.0000 mg | ORAL_CAPSULE | Freq: Two times a day (BID) | ORAL | 0 refills | Status: AC

## 2021-02-05 NOTE — ED Triage Notes (Signed)
Pt in with co lower mid abdominal pain that has been going on for 3 days now  Pt has tied ibuprofen with no relief  Denies any nausea or vomiting

## 2021-02-05 NOTE — Discharge Instructions (Signed)
Take the antibiotic as directed.  The urine culture is pending.  We will call you if it shows the need to change or discontinue your antibiotic.    Go to the emergency department if you have acute abdominal pain.    Follow up with your primary care provider if your symptoms are not improving.

## 2021-02-05 NOTE — ED Provider Notes (Signed)
[facility]    CSN: [id number] Arrival date & time: 02/05/21  1333      History   Chief Complaint Chief Complaint  Patient presents with  . Abdominal Pain    HPI Tanya Moreno is a 30 y.o. female.   Patient presents with suprapubic lower abdominal pain x3 days.  She denies fever, chills, vomiting, diarrhea, constipation, vaginal discharge, pelvic pain, or other symptoms.  Treatment attempted at home with ibuprofen.  Patient was seen here on 02/03/2021; diagnosed with abnormal uterine bleeding; urine pregnancy negative at that time; patient instructed to follow-up with gynecology.  Her medical history includes hypertension, obesity, HSV, gonorrhea, chlamydia, trichomonas.  The history is provided by the patient and medical records.    Past Medical History:  Diagnosis Date  . Chlamydia   . Chlamydia infection affecting pregnancy in first trimester 02/09/2017   Positive on 02/08/17, azithromycin 1000 mg PO x 1 dose sent to pt pharmacy and pt notified by phone by MAU staff Positive on 7/30  . Genital HSV    Last outbreak Nov 2013  . Gonorrhea   . Hypertension   . Trichomonas     Patient Active Problem List   Diagnosis Date Noted  . Chronic hypertension 07/22/2019  . Abnormal Papanicolaou smear of cervix with positive human papilloma virus (HPV) test on 12/30/18 01/08/2019  . Carpal tunnel syndrome during pregnancy 08/24/2017  . History of HSV 04/14/2017  . BMI 40.0-44.9, adult (HCC) 04/14/2017  . Latex allergy 08/26/2011    Past Surgical History:  Procedure Laterality Date  . NO PAST SURGERIES      OB History    Gravida  4   Para  4   Term  4   Preterm      AB      Living  4     SAB      IAB      Ectopic      Multiple  0   Live Births  4            Home Medications    Prior to Admission medications   Medication Sig Start Date End Date Taking? Authorizing Provider  cephALEXin (KEFLEX) 500 MG capsule Take 1 capsule (500 mg total) by  mouth 2 (two) times daily for 5 days. 02/05/21 02/10/21 Yes Mickie Bail, NP  amLODipine (NORVASC) 10 MG tablet Take 1 tablet (10 mg total) by mouth daily. 06/17/20   Leftwich-Kirby, Wilmer Floor, CNM  cyclobenzaprine (FLEXERIL) 10 MG tablet Take 1 tablet (10 mg total) by mouth 2 (two) times daily as needed for muscle spasms. Patient not taking: No sig reported 07/19/20   Dahlia Byes A, NP  ibuprofen (ADVIL) 600 MG tablet Take 1 tablet (600 mg total) by mouth every 8 (eight) hours as needed for moderate pain. 11/03/20   Pollyann Savoy, MD  labetalol (NORMODYNE) 100 MG tablet Take 100 mg by mouth 2 (two) times daily.    [provider]  medroxyPROGESTERone (DEPO-PROVERA) 150 MG/ML injection Inject 1 mL (150 mg total) into the muscle every 3 (three) months. Patient not taking: No sig reported 07/18/20   Brock Bad, MD  metroNIDAZOLE (FLAGYL) 500 MG tablet Take 1 tablet (500 mg total) by mouth 2 (two) times daily. 11/24/20   Mannie Stabile, PA-C  ondansetron (ZOFRAN ODT) 4 MG disintegrating tablet Take 1 tablet (4 mg total) by mouth every 8 (eight) hours as needed for nausea or vomiting. 11/24/20  Claudette Stapler C, PA-C  potassium chloride SA (KLOR-CON) 20 MEQ tablet Take 1 tablet (20 mEq total) by mouth daily. Patient not taking: No sig reported 07/25/20   Charlynne Pander, MD  potassium chloride SA (KLOR-CON) 20 MEQ tablet Take 1 tablet (20 mEq total) by mouth daily for 5 days. 11/24/20 11/29/20  Mannie Stabile, PA-C    Family History History reviewed. No pertinent family history.  Social History Social History   Tobacco Use  . Smoking status: Former Smoker    Quit date: 2015    Years since quitting: 7.1  . Smokeless tobacco: Former Neurosurgeon    Quit date: 11/10/2014  Vaping Use  . Vaping Use: Never used  Substance Use Topics  . Alcohol use: No    Alcohol/week: 0.0 standard drinks  . Drug use: No     Allergies   Latex and Tape   Review of Systems Review of  Systems  Constitutional: Negative for chills and fever.  HENT: Negative for ear pain and sore throat.   Eyes: Negative for pain and visual disturbance.  Respiratory: Negative for cough and shortness of breath.   Cardiovascular: Negative for chest pain and palpitations.  Gastrointestinal: Positive for abdominal pain. Negative for constipation, diarrhea, nausea and vomiting.  Genitourinary: Negative for dysuria, flank pain, hematuria, pelvic pain and vaginal discharge.  Musculoskeletal: Negative for arthralgias and back pain.  Skin: Negative for color change and rash.  Neurological: Negative for seizures and syncope.  All other systems reviewed and are negative.    Physical Exam Triage Vital Signs ED Triage Vitals  Enc Vitals Group     BP 02/05/21 1345 128/70     Pulse Rate 02/05/21 1345 62     Resp 02/05/21 1345 19     Temp 02/05/21 1345 98.2 F (36.8 C)     Temp src --      SpO2 02/05/21 1345 98 %     Weight --      Height --      Head Circumference --      Peak Flow --      Pain Score 02/05/21 1343 5     Pain Loc --      Pain Edu? --      Excl. in GC? --    No data found.  Updated Vital Signs BP 128/70   Pulse 62   Temp 98.2 F (36.8 C)   Resp 19   LMP 02/03/2021   SpO2 98%   Breastfeeding No   Visual Acuity Right Eye Distance:   Left Eye Distance:   Bilateral Distance:    Right Eye Near:   Left Eye Near:    Bilateral Near:     Physical Exam Vitals and nursing note reviewed.  Constitutional:      General: She is not in acute distress.    Appearance: She is well-developed and well-nourished. She is obese. She is not ill-appearing.  HENT:     Head: Normocephalic and atraumatic.     Mouth/Throat:     Mouth: Mucous membranes are moist.  Eyes:     Conjunctiva/sclera: Conjunctivae normal.  Cardiovascular:     Rate and Rhythm: Normal rate and regular rhythm.     Heart sounds: Normal heart sounds.  Pulmonary:     Effort: Pulmonary effort is normal. No  respiratory distress.     Breath sounds: Normal breath sounds.  Abdominal:     Palpations: Abdomen is soft.     Tenderness: There  is no abdominal tenderness. There is no right CVA tenderness, left CVA tenderness, guarding or rebound.  Musculoskeletal:        General: No edema.     Cervical back: Neck supple.  Skin:    General: Skin is warm and dry.  Neurological:     General: No focal deficit present.     Mental Status: She is alert and oriented to person, place, and time.     Gait: Gait normal.  Psychiatric:        Mood and Affect: Mood and affect and mood normal.        Behavior: Behavior normal.      UC Treatments / Results  Labs (all labs ordered are listed, but only abnormal results are displayed) Labs Reviewed  POCT URINALYSIS DIPSTICK, ED / UC - Abnormal; Notable for the following components:      Result Value   Bilirubin Urine SMALL (*)    Hgb urine dipstick LARGE (*)    Protein, ur 100 (*)    Leukocytes,Ua LARGE (*)    All other components within normal limits  URINE CULTURE  POC URINE PREG, ED    EKG   Radiology No results found.  Procedures Procedures (including critical care time)  Medications Ordered in UC Medications - No data to display  Initial Impression / Assessment and Plan / UC Course  I have reviewed the triage vital signs and the nursing notes.  Pertinent labs & imaging results that were available during my care of the patient were reviewed by me and considered in my medical decision making (see chart for details).   Lower abdominal pain, leukocytes in urine.  Urine culture pending.  Treating with Keflex.  Discussed with patient that we will call her if the culture shows the need to change or discontinue her antibiotic.  Instructed her to go to the ED if she has acute abdominal pain or other concerning symptoms.  Instructed her to follow-up with her PCP or OB/GYN as directed.  She agrees to plan of care.   Final Clinical Impressions(s) /  UC Diagnoses   Final diagnoses:  Lower abdominal pain  Leukocytes in urine     Discharge Instructions     Take the antibiotic as directed.  The urine culture is pending.  We will call you if it shows the need to change or discontinue your antibiotic.    Go to the emergency department if you have acute abdominal pain.    Follow up with your primary care provider if your symptoms are not improving.       ED Prescriptions    Medication Sig Dispense Auth. Provider   cephALEXin (KEFLEX) 500 MG capsule Take 1 capsule (500 mg total) by mouth 2 (two) times daily for 5 days. 10 capsule Mickie Bail, NP     PDMP not reviewed this encounter.   Mickie Bail, NP 02/05/21 1420

## 2021-06-08 ENCOUNTER — Encounter (HOSPITAL_COMMUNITY): Payer: Self-pay | Admitting: Emergency Medicine

## 2021-06-08 ENCOUNTER — Ambulatory Visit (HOSPITAL_COMMUNITY)
Admission: EM | Admit: 2021-06-08 | Discharge: 2021-06-08 | Disposition: A | Discharge status: Home / Self Care | Payer: Medicaid Other | Attending: Family Medicine | Admitting: Family Medicine

## 2021-06-08 DIAGNOSIS — Z3201 Encounter for pregnancy test, result positive: Secondary | ICD-10-CM | POA: Diagnosis not present

## 2021-06-08 DIAGNOSIS — R112 Nausea with vomiting, unspecified: Secondary | ICD-10-CM | POA: Diagnosis not present

## 2021-06-08 DIAGNOSIS — R110 Nausea: Secondary | ICD-10-CM

## 2021-06-08 DIAGNOSIS — R1031 Right lower quadrant pain: Secondary | ICD-10-CM | POA: Diagnosis not present

## 2021-06-08 LAB — POCT URINALYSIS DIPSTICK, ED / UC
Bilirubin Urine: NEGATIVE
Glucose, UA: NEGATIVE mg/dL
Ketones, ur: NEGATIVE mg/dL
Nitrite: NEGATIVE
Protein, ur: NEGATIVE mg/dL
Specific Gravity, Urine: 1.025 (ref 1.005–1.030)
Urobilinogen, UA: 0.2 mg/dL (ref 0.0–1.0)
pH: 7 (ref 5.0–8.0)

## 2021-06-08 LAB — POC URINE PREG, ED: Preg Test, Ur: POSITIVE — AB

## 2021-06-08 NOTE — ED Triage Notes (Signed)
Pt is present with lower abdominal pain and nausea. Pt states tat her sx started one week ago

## 2021-06-08 NOTE — ED Provider Notes (Signed)
[facility]    CSN: [id number] Arrival date & time: 06/08/21  1209      History   Chief Complaint Chief Complaint  Patient presents with   Abdominal Pain    HPI Tanya Moreno is a 30 y.o. female.   Patient presenting today with 1 week history of nausea, morning time vomiting, focal right lower quadrant pain that she states is annoying but not severe and intermittent without known trigger.  She denies fever, chills, bowel changes, urinary symptoms, vaginal symptoms, recent sick contacts, change in medications or diet.  Has not tried anything over-the-counter for symptoms.  LMP was early April per patient.  She states that she took a pregnancy test at home in early May which was negative but she thinks that she tested too soon.  Has not taken a test since then.   Past Medical History:  Diagnosis Date   Chlamydia    Chlamydia infection affecting pregnancy in first trimester 02/09/2017   Positive on 02/08/17, azithromycin 1000 mg PO x 1 dose sent to pt pharmacy and pt notified by phone by [facility] staff Positive on 7/30   Genital HSV    Last outbreak Nov 2013   Gonorrhea    Hypertension    Trichomonas     Patient Active Problem List   Diagnosis Date Noted   Chronic hypertension 07/22/2019   Abnormal Papanicolaou smear of cervix with positive human papilloma virus (HPV) test on 12/30/18 01/08/2019   Carpal tunnel syndrome during pregnancy 08/24/2017   History of HSV 04/14/2017   BMI 40.0-44.9, adult (HCC) 04/14/2017   Latex allergy 08/26/2011   Past Surgical History:  Procedure Laterality Date   NO PAST SURGERIES     OB History     Gravida  4   Para  4   Term  4   Preterm      AB      Living  4      SAB      IAB      Ectopic      Multiple  0   Live Births  4            Home Medications    Prior to Admission medications   Medication Sig Start Date End Date Taking? Authorizing Provider  amLODipine (NORVASC) 10 MG tablet Take 1 tablet (10  mg total) by mouth daily. 06/17/20   Leftwich-Kirby, Wilmer Floor, CNM  cyclobenzaprine (FLEXERIL) 10 MG tablet Take 1 tablet (10 mg total) by mouth 2 (two) times daily as needed for muscle spasms. Patient not taking: No sig reported 07/19/20   Dahlia Byes A, NP  ibuprofen (ADVIL) 600 MG tablet Take 1 tablet (600 mg total) by mouth every 8 (eight) hours as needed for moderate pain. 11/03/20   Pollyann Savoy, MD  labetalol (NORMODYNE) 100 MG tablet Take 100 mg by mouth 2 (two) times daily.    [provider]  medroxyPROGESTERone (DEPO-PROVERA) 150 MG/ML injection Inject 1 mL (150 mg total) into the muscle every 3 (three) months. Patient not taking: No sig reported 07/18/20   Brock Bad, MD  metroNIDAZOLE (FLAGYL) 500 MG tablet Take 1 tablet (500 mg total) by mouth 2 (two) times daily. 11/24/20   Mannie Stabile, PA-C  ondansetron (ZOFRAN ODT) 4 MG disintegrating tablet Take 1 tablet (4 mg total) by mouth every 8 (eight) hours as needed for nausea or vomiting. 11/24/20   Claudette Stapler C, PA-C  potassium chloride SA (KLOR-CON)  20 MEQ tablet Take 1 tablet (20 mEq total) by mouth daily. Patient not taking: No sig reported 07/25/20   Charlynne Pander, MD  potassium chloride SA (KLOR-CON) 20 MEQ tablet Take 1 tablet (20 mEq total) by mouth daily for 5 days. 11/24/20 11/29/20  Mannie Stabile, PA-C    Family History History reviewed. No pertinent family history.  Social History Social History   Tobacco Use   Smoking status: Former    Pack years: 0.00    Types: Cigarettes    Quit date: 2015    Years since quitting: 7.5   Smokeless tobacco: Former    Quit date: 11/10/2014  Vaping Use   Vaping Use: Never used  Substance Use Topics   Alcohol use: No    Alcohol/week: 0.0 standard drinks   Drug use: No     Allergies   Latex and Tape   Review of Systems Review of Systems Per HPI  Physical Exam Triage Vital Signs ED Triage Vitals  Enc Vitals Group     BP  06/08/21 1339 135/73     Pulse Rate 06/08/21 1338 83     Resp 06/08/21 1338 18     Temp --      Temp Source 06/08/21 1338 Oral     SpO2 06/08/21 1338 100 %     Weight --      Height --      Head Circumference --      Peak Flow --      Pain Score 06/08/21 1336 7     Pain Loc --      Pain Edu? --      Excl. in GC? --    No data found.  Updated Vital Signs BP 135/73   Pulse 83   Resp 18   SpO2 100%   Breastfeeding No   Visual Acuity Right Eye Distance:   Left Eye Distance:   Bilateral Distance:    Right Eye Near:   Left Eye Near:    Bilateral Near:     Physical Exam Vitals and nursing note reviewed.  Constitutional:      Appearance: Normal appearance. She is not ill-appearing.  HENT:     Head: Atraumatic.     Mouth/Throat:     Mouth: Mucous membranes are moist.  Eyes:     Extraocular Movements: Extraocular movements intact.     Conjunctiva/sclera: Conjunctivae normal.  Cardiovascular:     Rate and Rhythm: Normal rate and regular rhythm.     Heart sounds: Normal heart sounds.  Pulmonary:     Effort: Pulmonary effort is normal.     Breath sounds: Normal breath sounds.  Abdominal:     General: Bowel sounds are normal. There is no distension.     Palpations: Abdomen is soft.     Tenderness: There is abdominal tenderness. There is no right CVA tenderness, left CVA tenderness or guarding.     Comments: Focal right lower quadrant tenderness palpation without guarding.  No distention.  Musculoskeletal:        General: Normal range of motion.     Cervical back: Normal range of motion and neck supple.  Skin:    General: Skin is warm and dry.  Neurological:     Mental Status: She is alert and oriented to person, place, and time.  Psychiatric:        Mood and Affect: Mood normal.        Thought Content: Thought content normal.  Judgment: Judgment normal.   UC Treatments / Results  Labs (all labs ordered are listed, but only abnormal results are  displayed) Labs Reviewed  POCT URINALYSIS DIPSTICK, ED / UC - Abnormal; Notable for the following components:      Result Value   Hgb urine dipstick TRACE (*)    Leukocytes,Ua LARGE (*)    All other components within normal limits  POC URINE PREG, ED - Abnormal; Notable for the following components:   Preg Test, Ur POSITIVE (*)    All other components within normal limits   EKG  Radiology No results found.  Procedures Procedures (including critical care time)  Medications Ordered in UC Medications - No data to display  Initial Impression / Assessment and Plan / UC Course  I have reviewed the triage vital signs and the nursing notes.  Pertinent labs & imaging results that were available during my care of the patient were reviewed by me and considered in my medical decision making (see chart for details).     Urine pregnancy test positive today, LMP about 3 months ago.  Given this and her new onset focal right lower quadrant abdominal pain and nausea will refer to the MAU for further evaluation.  She is agreeable and wishes to go via private vehicle.  She is hemodynamically stable for transport at this time.  Final Clinical Impressions(s) / UC Diagnoses   Final diagnoses:  Right lower quadrant abdominal pain  Nausea  Intractable vomiting with nausea, unspecified vomiting type  Positive pregnancy test   Discharge Instructions   None    ED Prescriptions   None    PDMP not reviewed this encounter.   Particia Nearing, [place] 06/08/21 1415

## 2021-06-12 IMAGING — US US MFM OB FOLLOW UP
1 series · 14 of 28 positions shown · non-contrast
Comparison: none

[Series 1: us mfm ob follow up · 14 of 61 slices shown]
[im 3/61]
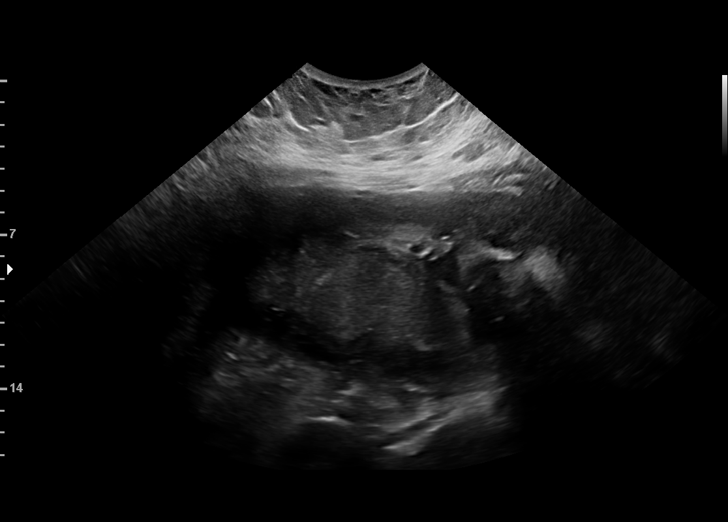
[im 7/61]
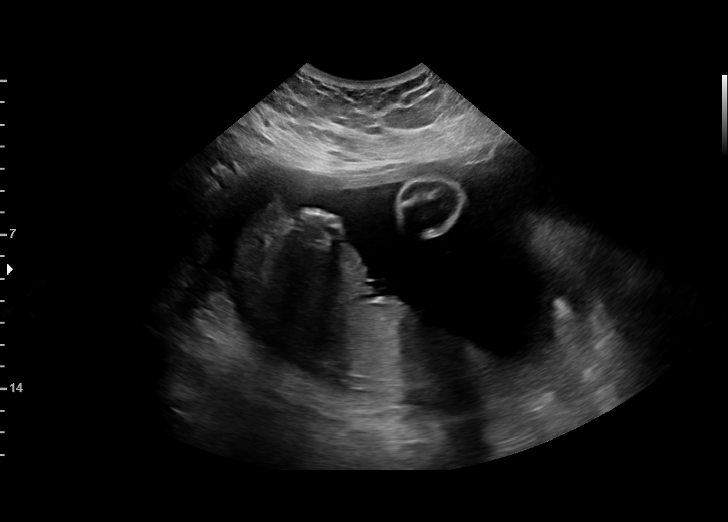
[im 12/61]
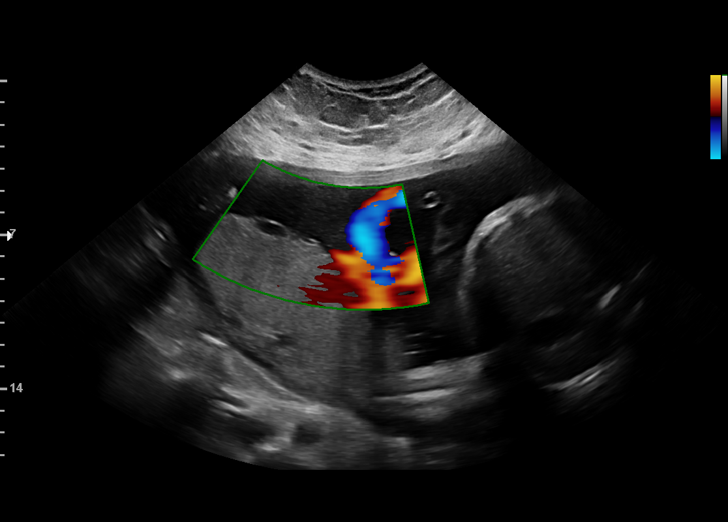
[im 16/61]
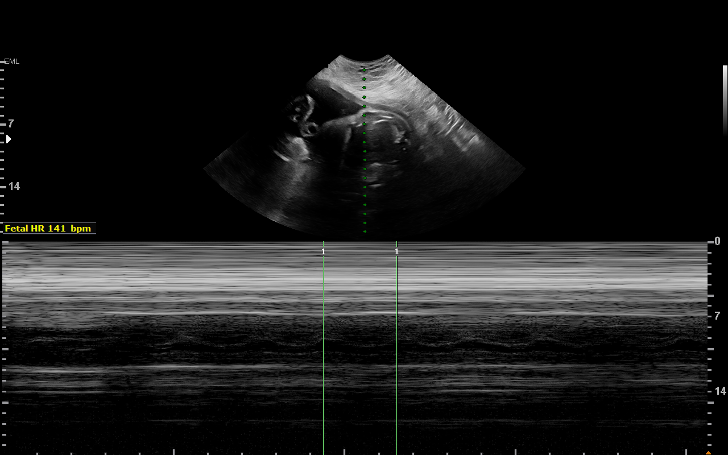
[im 21/61]
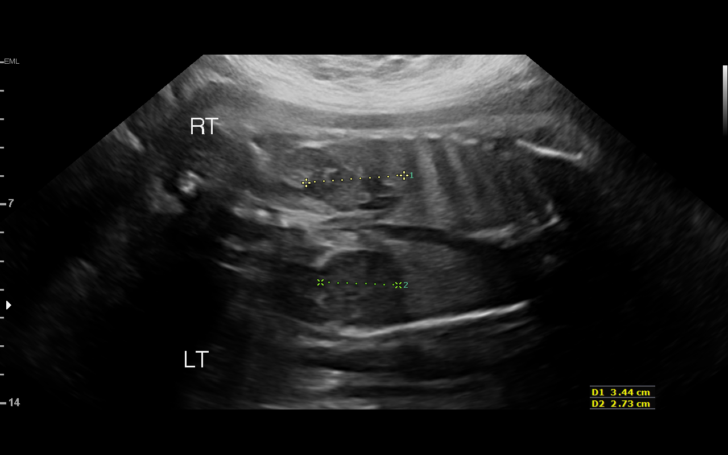
[im 25/61]
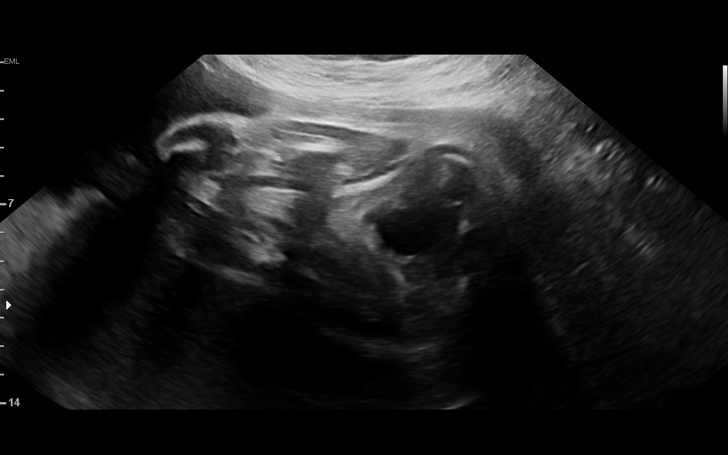
[im 29/61]
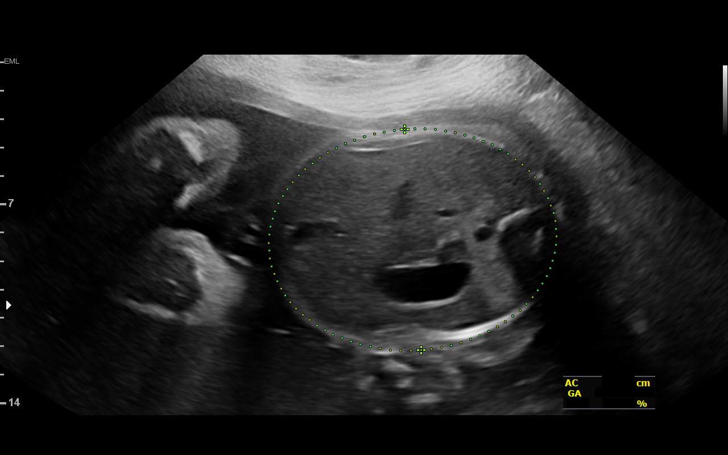
[im 34/61]
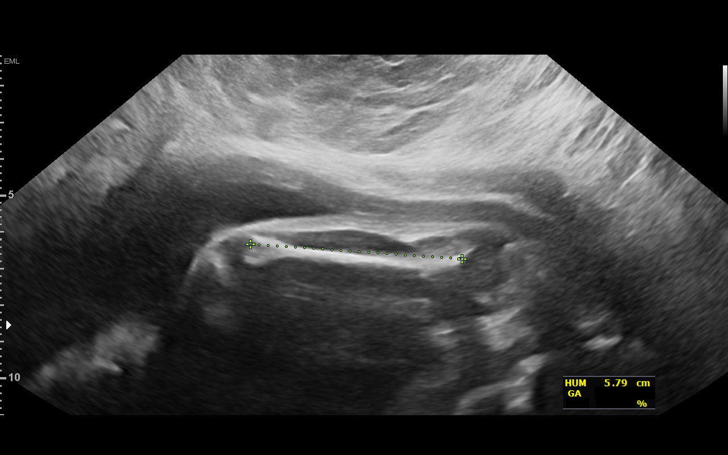
[im 38/61]
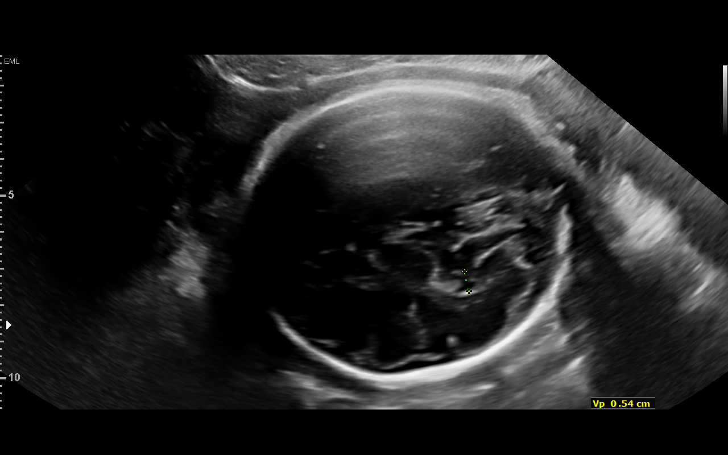
[im 43/61]
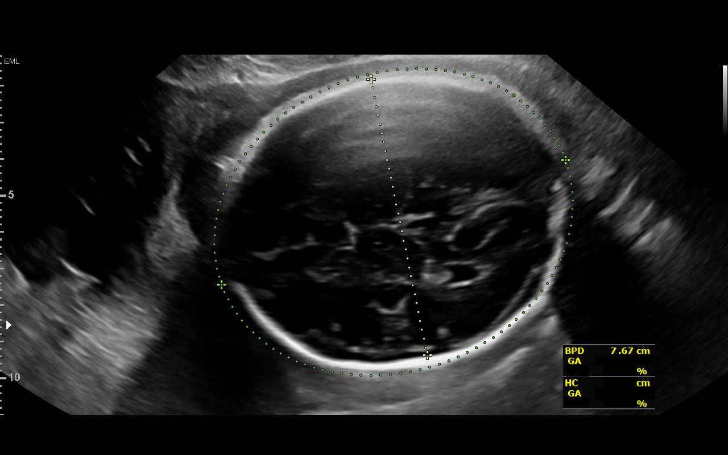
[im 47/61]
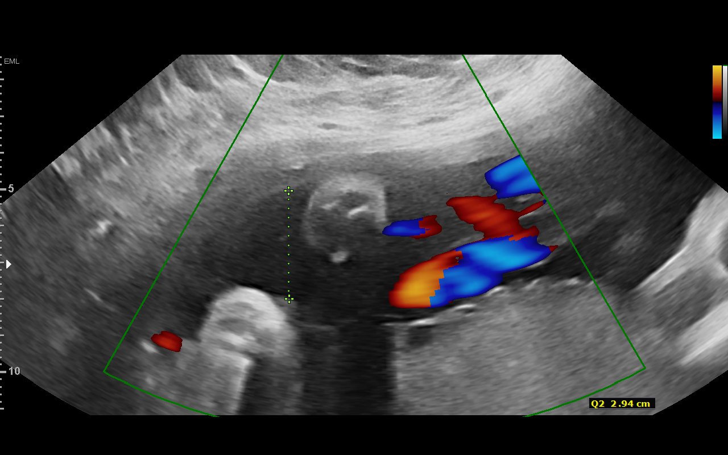
[im 52/61]
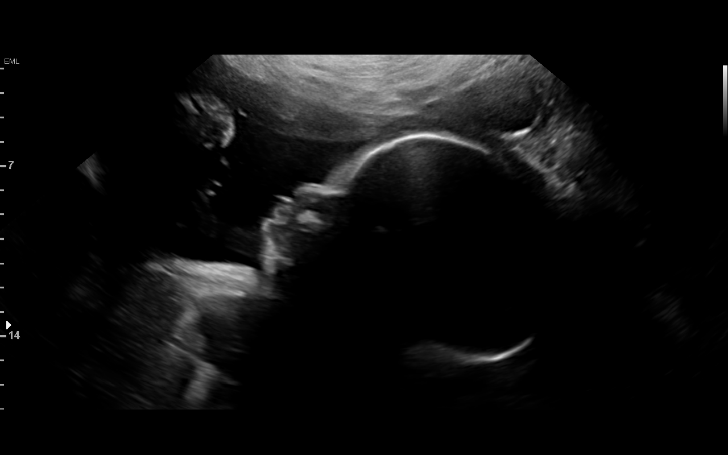
[im 56/61]
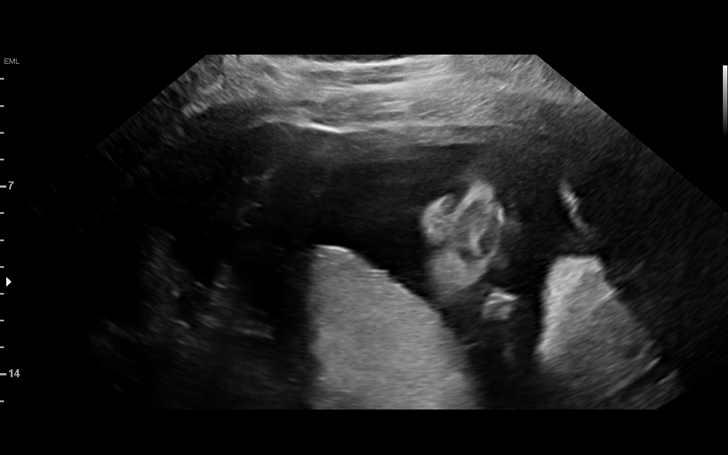
[im 61/61]
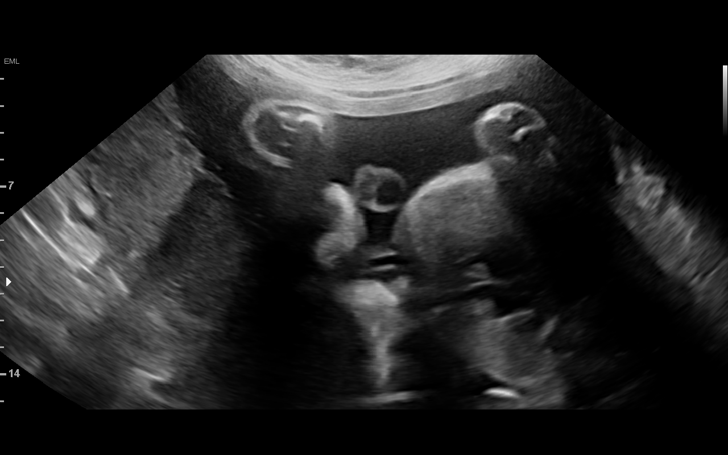

[14 of 28 positions shown; findings below may reference images not displayed]

----------------------------------------------------------------------

 ----------------------------------------------------------------------
Indications

  30 weeks gestation of pregnancy
  Encounter for antenatal screening for
  malformations
  Obesity complicating pregnancy, second
  trimester (pregravid BMI 45)
  Hypertension - Chronic/Pre-existing
  (labetalol)
 ----------------------------------------------------------------------
Vital Signs

 BMI:
Fetal Evaluation

 Num Of Fetuses:         1
 Fetal Heart Rate(bpm):  141
 Cardiac Activity:       Observed
 Presentation:           Cephalic
 Placenta:               Posterior
 P. Cord Insertion:      Visualized, central

 Amniotic Fluid
 AFI FV:      Within normal limits

 AFI Sum(cm)     %Tile       Largest Pocket(cm)
 10.26           16

 RUQ(cm)       RLQ(cm)       LUQ(cm)        LLQ(cm)

Biometry

 BPD:      77.3  mm     G. Age:  31w 0d         52  %    CI:        76.36   %    70 - 86
                                                         FL/HC:      22.3   %    19.3 -
 HC:      280.3  mm     G. Age:  30w 5d         20  %    HC/AC:      0.99        0.96 -
 AC:      282.8  mm     G. Age:  32w 2d         89  %    FL/BPD:     80.9   %    71 - 87
 FL:       62.5  mm     G. Age:  32w 2d         82  %    FL/AC:      22.1   %    20 - 24
 HUM:      58.1  mm     G. Age:  33w 5d       > 95  %
 CER:      39.4  mm     G. Age:  33w 3d         93  %
 LV:        5.4  mm
 CM:          9  mm

 Est. FW:    3163  gm      4 lb 3 oz     78  %
OB History

 Gravidity:    4         Term:   3        Prem:   0        SAB:   0
 TOP:          0       Ectopic:  0        Living: 3
Gestational Age

 LMP:           32w 2d        Date:  10/10/18                 EDD:   07/17/19
 U/S Today:     31w 4d                                        EDD:   07/22/19
 Best:          30w 4d     Det. By:  Early Ultrasound         EDD:   07/29/19
                                     (01/06/19)
Anatomy

 Cranium:               Appears normal         LVOT:                   Not well visualized
 Cavum:                 Appears normal         Aortic Arch:            Previously seen
 Ventricles:            Appears normal         Ductal Arch:            Previously seen
 Choroid Plexus:        Appears normal         Diaphragm:              Appears normal
 Cerebellum:            Appears normal         Stomach:                Appears normal, left
                                                                       sided
 Posterior Fossa:       Appears normal         Abdomen:                Appears normal
 Nuchal Fold:           Not applicable (>20    Abdominal Wall:         Appears nml (cord
                        wks GA)                                        insert, abd wall)
 Face:                  Orbits previously      Cord Vessels:           Appears normal (3
                        seen, profile normal                           vessel cord)
 Lips:                  Appears normal         Kidneys:                Appear normal
 Palate:                Appears normal         Bladder:                Appears normal
 Thoracic:              Appears normal         Spine:                  Previously seen
 Heart:                 Not well visualized    Upper Extremities:      Previously seen
 RVOT:                  Not well visualized    Lower Extremities:      Previously seen

 Other:  Heels previously visualized. Nasal bone visualized.
Cervix Uterus Adnexa

 Cervix
 Not visualized (advanced GA >12wks)

 Left Ovary
 Within normal limits.

 Right Ovary
 Within normal limits.
 Adnexa
 No abnormality visualized.
Impression

 Normal interval growth.  No ultrasonic evidence of structural
 fetal anomalies.
 Chronic Hypertension on meds
Recommendations

 Follow growth in 4 weeks
 Initiate weekly testing at 32 weeks.

## 2021-06-19 ENCOUNTER — Ambulatory Visit (INDEPENDENT_AMBULATORY_CARE_PROVIDER_SITE_OTHER): Payer: Medicaid Other

## 2021-06-19 ENCOUNTER — Other Ambulatory Visit: Payer: Self-pay

## 2021-06-19 VITALS — BP 143/95 | HR 70 | Ht 64.0 in | Wt 294.4 lb

## 2021-06-19 DIAGNOSIS — O0990 Supervision of high risk pregnancy, unspecified, unspecified trimester: Secondary | ICD-10-CM | POA: Insufficient documentation

## 2021-06-19 DIAGNOSIS — Z3A08 8 weeks gestation of pregnancy: Secondary | ICD-10-CM | POA: Diagnosis not present

## 2021-06-19 DIAGNOSIS — O09891 Supervision of other high risk pregnancies, first trimester: Secondary | ICD-10-CM

## 2021-06-19 DIAGNOSIS — Z3A09 9 weeks gestation of pregnancy: Secondary | ICD-10-CM

## 2021-06-19 DIAGNOSIS — O3680X0 Pregnancy with inconclusive fetal viability, not applicable or unspecified: Secondary | ICD-10-CM

## 2021-06-19 DIAGNOSIS — O0991 Supervision of high risk pregnancy, unspecified, first trimester: Secondary | ICD-10-CM

## 2021-06-19 MED ORDER — DICLEGIS 10-10 MG PO TBEC: 2.0000 | DELAYED_RELEASE_TABLET | Freq: Every day | ORAL | 5 refills | Status: DC

## 2021-06-19 MED ORDER — GOJJI WEIGHT SCALE MISC: 1.0000 | 0 refills | Status: DC

## 2021-06-19 MED ORDER — VITAFOL GUMMIES 3.33-0.333-34.8 MG PO CHEW: 3.0000 | CHEWABLE_TABLET | Freq: Every day | ORAL | 11 refills | Status: DC

## 2021-06-19 NOTE — Progress Notes (Signed)
New OB Intake  I connected with  Tanya Moreno on 06/19/21 at  3:15 PM EDT by in person. Video Visit and verified that I am speaking with the correct person using two identifiers. Nurse is located at [facility] and pt is located at [facility].  I discussed the limitations, risks, security and privacy concerns of performing an evaluation and management service by telephone and the availability of in person appointments. I also discussed with the patient that there may be a patient responsible charge related to this service. The patient expressed understanding and agreed to proceed.  I explained I am completing New OB Intake today. We discussed her EDD of 01/26/22 that is based on early u/s. Pt is G5/P4004. I reviewed her allergies, medications, Medical/Surgical/OB history, and appropriate screenings. I informed her of [facility] services. Based on history, this is a/an  pregnancy complicated by hypertension .   Patient Active Problem List   Diagnosis Date Noted   Chronic hypertension 07/22/2019   Abnormal Papanicolaou smear of cervix with positive human papilloma virus (HPV) test on 12/30/18 01/08/2019   Carpal tunnel syndrome during pregnancy 08/24/2017   History of HSV 04/14/2017   BMI 40.0-44.9, adult (HCC) 04/14/2017   Latex allergy 08/26/2011    Concerns addressed today  Delivery Plans:  Plans to deliver at [facility] [address] [facility].   MyChart/Babyscripts MyChart access verified. I explained pt will have some visits in office and some virtually. Babyscripts instructions given and order placed. Patient verifies receipt of registration text/e-mail. Account successfully created and app downloaded.  Blood Pressure Cuff  Patient has BP cuff at home. Explained after first prenatal appt pt will check weekly and document in Babyscripts.  Weight scale: Patient does not have weight scale. Weight scale ordered for patient to pick up form [facility].   Anatomy US Explained first scheduled [place] will be around 19  weeks. Dating and viability scan performed today.  Labs Discussed Avelina Laine genetic screening with patient. Would like both Panorama and Horizon drawn at new OB visit. Routine prenatal labs needed.  Covid Vaccine Patient has covid vaccine.   Mother/ Baby Dyad Candidate?    If yes, offer as possibility  Informed patient of [facility] website  and placed link in her AVS.   Social Determinants of Health Food Insecurity: Patient denies food insecurity. WIC Referral: Patient is interested in referral to [facility].  Transportation: Patient denies transportation needs. Childcare: Discussed no children allowed at ultrasound appointments. Offered childcare services; patient expresses need for childcare services. Childcare scheduled for appropriate appointments and information given to patient.   Placed OB Box on problem list and updated  First visit review I reviewed new OB appt with pt. I explained she will have a pelvic exam, ob bloodwork with genetic screening, and PAP smear. Explained pt will be seen by Coral Ceo at first visit; encounter routed to appropriate provider. Explained that patient will be seen by pregnancy navigator following visit with provider. [facility] information placed in AVS.   Hamilton Capri, RN 06/19/2021  3:10 PM

## 2021-06-20 NOTE — Progress Notes (Signed)
Agree with A & P. 

## 2021-06-26 ENCOUNTER — Encounter: Payer: Medicaid Other | Admitting: Obstetrics

## 2021-07-16 ENCOUNTER — Ambulatory Visit (INDEPENDENT_AMBULATORY_CARE_PROVIDER_SITE_OTHER): Payer: Medicaid Other | Admitting: Obstetrics

## 2021-07-16 ENCOUNTER — Other Ambulatory Visit (HOSPITAL_COMMUNITY)
Admission: RE | Admit: 2021-07-16 | Discharge: 2021-07-16 | Disposition: A | Discharge status: Home / Self Care | Payer: Medicaid Other | Source: Ambulatory Visit | Attending: Obstetrics | Admitting: Obstetrics

## 2021-07-16 ENCOUNTER — Other Ambulatory Visit: Payer: Self-pay

## 2021-07-16 ENCOUNTER — Encounter: Payer: Self-pay | Admitting: Obstetrics

## 2021-07-16 VITALS — BP 135/95 | HR 80 | Wt 299.0 lb

## 2021-07-16 DIAGNOSIS — Z3480 Encounter for supervision of other normal pregnancy, unspecified trimester: Secondary | ICD-10-CM

## 2021-07-16 DIAGNOSIS — K117 Disturbances of salivary secretion: Secondary | ICD-10-CM

## 2021-07-16 DIAGNOSIS — O26899 Other specified pregnancy related conditions, unspecified trimester: Secondary | ICD-10-CM

## 2021-07-16 DIAGNOSIS — R12 Heartburn: Secondary | ICD-10-CM

## 2021-07-16 DIAGNOSIS — O99210 Obesity complicating pregnancy, unspecified trimester: Secondary | ICD-10-CM

## 2021-07-16 DIAGNOSIS — I10 Essential (primary) hypertension: Secondary | ICD-10-CM

## 2021-07-16 DIAGNOSIS — Z3A12 12 weeks gestation of pregnancy: Secondary | ICD-10-CM

## 2021-07-16 DIAGNOSIS — O0990 Supervision of high risk pregnancy, unspecified, unspecified trimester: Secondary | ICD-10-CM | POA: Diagnosis present

## 2021-07-16 LAB — HEPATITIS C ANTIBODY: HCV Ab: NEGATIVE

## 2021-07-16 MED ORDER — ASPIRIN 81 MG PO CHEW: 81.0000 mg | CHEWABLE_TABLET | Freq: Every day | ORAL | 6 refills | Status: DC

## 2021-07-16 MED ORDER — LABETALOL HCL 200 MG PO TABS
200.0000 mg | ORAL_TABLET | Freq: Two times a day (BID) | ORAL | 3 refills | Status: DC
Start: 2021-07-16 — End: 2022-01-21

## 2021-07-16 MED ORDER — GLYCOPYRROLATE 1 MG PO TABS: 1.0000 mg | ORAL_TABLET | Freq: Two times a day (BID) | ORAL | 6 refills | Status: DC

## 2021-07-16 MED ORDER — PANTOPRAZOLE SODIUM 40 MG PO TBEC: 40.0000 mg | DELAYED_RELEASE_TABLET | Freq: Every day | ORAL | 6 refills | Status: DC

## 2021-07-16 NOTE — Progress Notes (Signed)
Subjective:    Tanya Moreno is being seen today for her first obstetrical visit.  This is not a planned pregnancy. She is at [redacted]w[redacted]d gestation. Her obstetrical history is significant for obesity and pregnancy induced hypertension. Relationship with FOB: significant other, living together. Patient does not intend to breast feed. Pregnancy history fully reviewed.  The information documented in the HPI was reviewed and verified.  Menstrual History: OB History     Gravida  5   Para  4   Term  4   Preterm      AB      Living  4      SAB      IAB      Ectopic      Multiple  0   Live Births  4           Patient's last menstrual period was 03/19/2021.    Past Medical History:  Diagnosis Date   Chlamydia    Chlamydia infection affecting pregnancy in first trimester 02/09/2017   Positive on 02/08/17, azithromycin 1000 mg PO x 1 dose sent to pt pharmacy and pt notified by phone by [facility] staff Positive on 7/30   Genital HSV    Last outbreak Nov 2013   Gonorrhea    Hypertension    Trichomonas     Past Surgical History:  Procedure Laterality Date   NO PAST SURGERIES      (Not in a hospital admission)  Allergies  Allergen Reactions   Latex Swelling    Mainly latex condoms   Tape Itching    Social History   Tobacco Use   Smoking status: Former    Types: Cigarettes    Quit date: 2015    Years since quitting: 7.6   Smokeless tobacco: Former    Quit date: 11/10/2014  Substance Use Topics   Alcohol use: No    Alcohol/week: 0.0 standard drinks    Family History  Problem Relation Age of Onset   Hypertension Maternal Grandmother      Review of Systems Constitutional: negative for weight loss Gastrointestinal: negative for vomiting Genitourinary:negative for genital lesions and vaginal discharge and dysuria Musculoskeletal:negative for back pain Behavioral/Psych: negative for abusive relationship, depression, illegal drug usage and tobacco use    Objective:     BP (!) 135/95   Pulse 80   Wt 299 lb (135.6 kg)   LMP 03/19/2021   BMI 51.32 kg/m  General Appearance:    Alert, cooperative, no distress, appears stated age  Head:    Normocephalic, without obvious abnormality, atraumatic  Eyes:    PERRL, conjunctiva/corneas clear, EOM's intact, fundi    benign, both eyes  Ears:    Normal TM's and external ear canals, both ears  Nose:   Nares normal, septum midline, mucosa normal, no drainage    or sinus tenderness  Throat:   Lips, mucosa, and tongue normal; teeth and gums normal  Neck:   Supple, symmetrical, trachea midline, no adenopathy;    thyroid:  no enlargement/tenderness/nodules; no carotid   bruit or JVD  Back:     Symmetric, no curvature, ROM normal, no CVA tenderness  Lungs:     Clear to auscultation bilaterally, respirations unlabored  Chest Wall:    No tenderness or deformity   Heart:    Regular rate and rhythm, S1 and S2 normal, no murmur, rub   or gallop  Breast Exam:    No tenderness, masses, or nipple abnormality  Abdomen:  Soft, non-tender, bowel sounds active all four quadrants,    no masses, no organomegaly  Genitalia:    Normal female without lesion, discharge or tenderness  Extremities:   Extremities normal, atraumatic, no cyanosis or edema  Pulses:   2+ and symmetric all extremities  Skin:   Skin color, texture, turgor normal, no rashes or lesions  Lymph nodes:   Cervical, supraclavicular, and axillary nodes normal  Neurologic:   CNII-XII intact, normal strength, sensation and reflexes    throughout      Lab Review Urine pregnancy test Labs reviewed yes Radiologic studies reviewed yes  Assessment:    Pregnancy at [redacted]w[redacted]d weeks    Plan:   1. Supervision of high risk pregnancy, antepartum Rx: - Culture, OB Urine - Genetic Screening - Cervicovaginal ancillary only - Obstetric Panel, Including HIV - Cytology - PAP( [place]) - aspirin 81 MG chewable tablet; Chew 1 tablet (81 mg total) by mouth daily.   Dispense: 30 tablet; Refill: 6 - Hepatitis C antibody  2. Chronic hypertension Rx: - labetalol (NORMODYNE) 200 MG tablet; Take 1 tablet (200 mg total) by mouth 2 (two) times daily.  Dispense: 60 tablet; Refill: 3  3. Obesity affecting pregnancy, antepartum  4. Ptyalism Rx: - glycopyrrolate (ROBINUL) 1 MG tablet; Take 1 tablet (1 mg total) by mouth 2 (two) times daily.  Dispense: 60 tablet; Refill: 6   5. Heartburn Rx: - Protonix 40 mg po daily   Prenatal Vitamins Counseling provided regarding continued use of seat belts, cessation of alcohol consumption, smoking or use of illicit drugs; infection precautions i.e., influenza/TDAP immunizations, toxoplasmosis,CMV, parvovirus, listeria and varicella; workplace safety, exercise during pregnancy; routine dental care, safe medications, sexual activity, hot tubs, saunas, pools, travel, caffeine use, fish and methlymercury, potential toxins, hair treatments, varicose veins Weight gain recommendations per IOM guidelines reviewed: underweight/BMI< 18.5--> gain 28 - 40 lbs; normal weight/BMI 18.5 - 24.9--> gain 25 - 35 lbs; overweight/BMI 25 - 29.9--> gain 15 - 25 lbs; obese/BMI >30->gain  11 - 20 lbs Problem list reviewed and updated. FIRST/CF mutation testing/NIPT/QUAD SCREEN/fragile X/Ashkenazi Jewish population testing/Spinal muscular atrophy discussed: requested. Role of ultrasound in pregnancy discussed; fetal survey: requested. Amniocentesis discussed: not indicated.  Meds ordered this encounter  Medications   glycopyrrolate (ROBINUL) 1 MG tablet    Sig: Take 1 tablet (1 mg total) by mouth 2 (two) times daily.    Dispense:  60 tablet    Refill:  6   labetalol (NORMODYNE) 200 MG tablet    Sig: Take 1 tablet (200 mg total) by mouth 2 (two) times daily.    Dispense:  60 tablet    Refill:  3   aspirin 81 MG chewable tablet    Sig: Chew 1 tablet (81 mg total) by mouth daily.    Dispense:  30 tablet    Refill:  6   Orders Placed This  Encounter  Procedures   Culture, OB Urine   Genetic Screening    PANORAMA   Obstetric Panel, Including HIV   Hepatitis C antibody    Follow up in 4 weeks.  I have spent a total of 20 minutes of face-to-face time, excluding clinical staff time, reviewing notes and preparing to see patient, ordering tests and/or medications, and counseling the patient.    Brock Bad, MD 07/16/2021 3:27 PM

## 2021-07-16 NOTE — Progress Notes (Signed)
Pt presents for NOB visit. NOB intake completed 06/19/21. Normal pap 06/17/20

## 2021-07-18 LAB — OBSTETRIC PANEL, INCLUDING HIV
Antibody Screen: NEGATIVE
Basophils Absolute: 0 10*3/uL (ref 0.0–0.2)
Basos: 0 %
EOS (ABSOLUTE): 0.2 10*3/uL (ref 0.0–0.4)
Eos: 2 %
HIV Screen 4th Generation wRfx: NONREACTIVE
Hematocrit: 39 % (ref 34.0–46.6)
Hemoglobin: 13.4 g/dL (ref 11.1–15.9)
Hepatitis B Surface Ag: NEGATIVE
Immature Grans (Abs): 0 10*3/uL (ref 0.0–0.1)
Immature Granulocytes: 0 %
Lymphocytes Absolute: 2.9 10*3/uL (ref 0.7–3.1)
Lymphs: 42 %
MCH: 30.2 pg (ref 26.6–33.0)
MCHC: 34.4 g/dL (ref 31.5–35.7)
MCV: 88 fL (ref 79–97)
Monocytes Absolute: 0.7 10*3/uL (ref 0.1–0.9)
Monocytes: 10 %
Neutrophils Absolute: 3 10*3/uL (ref 1.4–7.0)
Neutrophils: 46 %
Platelets: 326 10*3/uL (ref 150–450)
RBC: 4.43 x10E6/uL (ref 3.77–5.28)
RDW: 12.8 % (ref 11.7–15.4)
RPR Ser Ql: NONREACTIVE
Rh Factor: POSITIVE
Rubella Antibodies, IGG: 1.55 index (ref >0.99)
WBC: 6.8 10*3/uL (ref 3.4–10.8)

## 2021-07-18 LAB — HEPATITIS C ANTIBODY: Hep C Virus Ab: 0.1 s/co ratio (ref 0.0–0.9)

## 2021-07-18 LAB — URINE CULTURE, OB REFLEX

## 2021-07-18 LAB — CULTURE, OB URINE

## 2021-07-20 LAB — CYTOLOGY - PAP
Comment: NEGATIVE
Diagnosis: NEGATIVE
Diagnosis: REACTIVE
High risk HPV: NEGATIVE

## 2021-07-21 ENCOUNTER — Other Ambulatory Visit: Payer: Self-pay | Admitting: Obstetrics

## 2021-07-21 ENCOUNTER — Telehealth: Payer: Self-pay

## 2021-07-21 DIAGNOSIS — A5901 Trichomonal vulvovaginitis: Secondary | ICD-10-CM

## 2021-07-21 MED ORDER — METRONIDAZOLE 500 MG PO TABS: 500.0000 mg | ORAL_TABLET | Freq: Two times a day (BID) | ORAL | 2 refills | Status: DC

## 2021-07-21 NOTE — Telephone Encounter (Signed)
-----   Message from Brock Bad, MD sent at 07/21/2021  8:26 AM EDT ----- Flagyl Rx for Trichomonas

## 2021-07-21 NOTE — Telephone Encounter (Signed)
Patient is mychart active.Results reviewed. RX has been sent to her pharmacy.

## 2021-07-22 LAB — CERVICOVAGINAL ANCILLARY ONLY
Candida Glabrata: NEGATIVE
Candida Vaginitis: NEGATIVE
Chlamydia: POSITIVE — AB
Comment: NEGATIVE
Comment: NEGATIVE
Comment: NEGATIVE
Comment: NEGATIVE
Comment: NORMAL
Neisseria Gonorrhea: NEGATIVE
Trichomonas: POSITIVE — AB

## 2021-07-23 ENCOUNTER — Telehealth: Payer: Self-pay

## 2021-07-23 ENCOUNTER — Other Ambulatory Visit: Payer: Self-pay | Admitting: Obstetrics

## 2021-07-23 DIAGNOSIS — A749 Chlamydial infection, unspecified: Secondary | ICD-10-CM

## 2021-07-23 MED ORDER — AZITHROMYCIN 500 MG PO TABS
1000.0000 mg | ORAL_TABLET | Freq: Once | ORAL | 0 refills | Status: AC
Start: 2021-07-23 — End: 2021-07-23

## 2021-07-23 NOTE — Telephone Encounter (Signed)
-----   Message from Brock Bad, MD sent at 07/23/2021  8:44 AM EDT ----- Azithromycin Rx for Chlamydia Flagyl Rx for Trichomonas

## 2021-07-23 NOTE — Telephone Encounter (Signed)
Pt notified of results and was already aware and has already picked up treatment medications. Pt also made aware will need Redraw on [facility] Panorama test due to insufficient fetal DNA  Pt voiced understanding and R/S Lab appt.

## 2021-07-25 ENCOUNTER — Other Ambulatory Visit: Payer: Medicaid Other

## 2021-07-28 ENCOUNTER — Encounter: Payer: Self-pay | Admitting: Obstetrics

## 2021-07-29 ENCOUNTER — Other Ambulatory Visit: Payer: Medicaid Other

## 2021-08-06 ENCOUNTER — Telehealth: Payer: Self-pay | Admitting: *Deleted

## 2021-08-06 NOTE — Telephone Encounter (Signed)
Returned TC. No answer. Left message to call back.

## 2021-08-13 ENCOUNTER — Encounter: Payer: Medicaid Other | Admitting: Obstetrics

## 2021-08-19 ENCOUNTER — Ambulatory Visit (INDEPENDENT_AMBULATORY_CARE_PROVIDER_SITE_OTHER): Payer: Medicaid Other | Admitting: Obstetrics

## 2021-08-19 ENCOUNTER — Encounter: Payer: Self-pay | Admitting: Obstetrics

## 2021-08-19 ENCOUNTER — Other Ambulatory Visit: Payer: Self-pay

## 2021-08-19 VITALS — BP 135/86 | HR 96 | Wt 300.3 lb

## 2021-08-19 DIAGNOSIS — I10 Essential (primary) hypertension: Secondary | ICD-10-CM

## 2021-08-19 DIAGNOSIS — A5901 Trichomonal vulvovaginitis: Secondary | ICD-10-CM

## 2021-08-19 DIAGNOSIS — O0990 Supervision of high risk pregnancy, unspecified, unspecified trimester: Secondary | ICD-10-CM

## 2021-08-19 DIAGNOSIS — O99210 Obesity complicating pregnancy, unspecified trimester: Secondary | ICD-10-CM

## 2021-08-19 DIAGNOSIS — A749 Chlamydial infection, unspecified: Secondary | ICD-10-CM

## 2021-08-19 NOTE — Progress Notes (Signed)
Pt reports feeling fetal movement, denies pain. Declined panorama redraw

## 2021-08-19 NOTE — Progress Notes (Signed)
Subjective:  Tanya Moreno is a 30 y.o. G5P4004 at [redacted]w[redacted]d being seen today for ongoing prenatal care.  She is currently monitored for the following issues for this high-risk pregnancy and has Latex allergy; History of HSV; BMI 40.0-44.9, adult (HCC); Carpal tunnel syndrome during pregnancy; Abnormal Papanicolaou smear of cervix with positive human papilloma virus (HPV) test on 12/30/18; Chronic hypertension; and Supervision of high risk pregnancy, antepartum on their problem list.  Patient reports no complaints.   .  .   . Denies leaking of fluid.   The following portions of the patient's history were reviewed and updated as appropriate: allergies, current medications, past family history, past medical history, past social history, past surgical history and problem list. Problem list updated.  Objective:   Vitals:   08/19/21 1534  BP: 135/86  Pulse: 96  Weight: (!) 300 lb 4.8 oz (136.2 kg)    Fetal Status:           General:  Alert, oriented and cooperative. Patient is in no acute distress.  Skin: Skin is warm and dry. No rash noted.   Cardiovascular: Normal heart rate noted  Respiratory: Normal respiratory effort, no problems with respiration noted  Abdomen: Soft, gravid, appropriate for gestational age.       Pelvic:  Cervical exam deferred        Extremities: Normal range of motion.     Mental Status: Normal mood and affect. Normal behavior. Normal judgment and thought content.   Urinalysis:      Assessment and Plan:  Pregnancy: G5P4004 at [redacted]w[redacted]d  1. Supervision of high risk pregnancy, antepartum - [place] MFM OB DETAIL + 14 WK  2. Chronic hypertension - taking Labetalol 200 mg po bid  3. Obesity affecting pregnancy, antepartum - taking Baby ASA 81 mg po daily  4. Chlamydia infection, treated - repeat cultures for TOC at 28 weeks  5. Trichomonas vaginitis - repeat cultures for TOC at 28 weeks   Preterm labor symptoms and general obstetric precautions including but not  limited to vaginal bleeding, contractions, leaking of fluid and fetal movement were reviewed in detail with the patient. Please refer to After Visit Summary for other counseling recommendations.   Return in about 4 weeks (around 09/16/2021) for [facility].   Brock Bad, MD  08/19/21

## 2021-09-09 ENCOUNTER — Encounter: Payer: Self-pay | Admitting: Obstetrics

## 2021-09-09 ENCOUNTER — Ambulatory Visit: Payer: Medicaid Other

## 2021-09-09 ENCOUNTER — Other Ambulatory Visit: Payer: Self-pay

## 2021-09-09 ENCOUNTER — Other Ambulatory Visit (HOSPITAL_COMMUNITY)
Admission: RE | Admit: 2021-09-09 | Discharge: 2021-09-09 | Disposition: A | Discharge status: Home / Self Care | Payer: Medicaid Other | Source: Ambulatory Visit | Attending: Obstetrics | Admitting: Obstetrics

## 2021-09-09 ENCOUNTER — Ambulatory Visit (INDEPENDENT_AMBULATORY_CARE_PROVIDER_SITE_OTHER): Payer: Medicaid Other | Admitting: Obstetrics

## 2021-09-09 VITALS — BP 143/90 | HR 92 | Wt 307.8 lb

## 2021-09-09 DIAGNOSIS — G5600 Carpal tunnel syndrome, unspecified upper limb: Secondary | ICD-10-CM

## 2021-09-09 DIAGNOSIS — N898 Other specified noninflammatory disorders of vagina: Secondary | ICD-10-CM | POA: Diagnosis present

## 2021-09-09 DIAGNOSIS — O26899 Other specified pregnancy related conditions, unspecified trimester: Secondary | ICD-10-CM | POA: Diagnosis not present

## 2021-09-09 DIAGNOSIS — A749 Chlamydial infection, unspecified: Secondary | ICD-10-CM

## 2021-09-09 DIAGNOSIS — A5901 Trichomonal vulvovaginitis: Secondary | ICD-10-CM

## 2021-09-09 NOTE — Progress Notes (Signed)
Patient presents for ROB. Patient declined AFP open. Patient states that her both of her wrists has been hurting and going numb. No other concerns.

## 2021-09-09 NOTE — Progress Notes (Signed)
Subjective:  Tanya Moreno is a 30 y.o. G5P4004 at [redacted]w[redacted]d being seen today for ongoing prenatal care.  She is currently monitored for the following issues for this low-risk pregnancy and has Latex allergy; History of HSV; BMI 40.0-44.9, adult (HCC); Carpal tunnel syndrome during pregnancy; Abnormal Papanicolaou smear of cervix with positive human papilloma virus (HPV) test on 12/30/18; Chronic hypertension; and Supervision of high risk pregnancy, antepartum on their problem list.  Patient reports  vaginal discharge and wrists hurt, bilaterally .  Contractions: Not present. Vag. Bleeding: None.  Movement: Present. Denies leaking of fluid.   The following portions of the patient's history were reviewed and updated as appropriate: allergies, current medications, past family history, past medical history, past social history, past surgical history and problem list. Problem list updated.  Objective:   Vitals:   09/09/21 1451  BP: (!) 143/90  Pulse: 92  Weight: (!) 307 lb 12.8 oz (139.6 kg)    Fetal Status:     Movement: Present     General:  Alert, oriented and cooperative. Patient is in no acute distress.  Skin: Skin is warm and dry. No rash noted.   Cardiovascular: Normal heart rate noted  Respiratory: Normal respiratory effort, no problems with respiration noted  Abdomen: Soft, gravid, appropriate for gestational age. Pain/Pressure: Absent     Pelvic:  Cervical exam deferred        Extremities: Normal range of motion.  Edema: None  Mental Status: Normal mood and affect. Normal behavior. Normal judgment and thought content.   Urinalysis:      Assessment and Plan:  Pregnancy: G5P4004 at [redacted]w[redacted]d  1. Vaginal discharge during pregnancy, antepartum Rx: - Cervicovaginal ancillary only( [place])  2. Chlamydia infection, treated - TOC done today  3. Trichomonas vaginitis, treated - TOC done today  4. Carpal tunnel syndrome during pregnancy - wrist splints recommended  Preterm  labor symptoms and general obstetric precautions including but not limited to vaginal bleeding, contractions, leaking of fluid and fetal movement were reviewed in detail with the patient. Please refer to After Visit Summary for other counseling recommendations.   Return in about 4 weeks (around 10/07/2021) for ROB.   Brock Bad, MD  09/09/21

## 2021-09-10 ENCOUNTER — Other Ambulatory Visit: Payer: Self-pay | Admitting: Obstetrics

## 2021-09-10 ENCOUNTER — Telehealth: Payer: Self-pay | Admitting: Obstetrics

## 2021-09-10 DIAGNOSIS — A749 Chlamydial infection, unspecified: Secondary | ICD-10-CM

## 2021-09-10 DIAGNOSIS — A5901 Trichomonal vulvovaginitis: Secondary | ICD-10-CM

## 2021-09-10 LAB — CERVICOVAGINAL ANCILLARY ONLY
Bacterial Vaginitis (gardnerella): NEGATIVE
Candida Glabrata: NEGATIVE
Candida Vaginitis: NEGATIVE
Chlamydia: POSITIVE — AB
Comment: NEGATIVE
Comment: NEGATIVE
Comment: NEGATIVE
Comment: NEGATIVE
Comment: NEGATIVE
Comment: NORMAL
Neisseria Gonorrhea: NEGATIVE
Trichomonas: POSITIVE — AB

## 2021-09-10 MED ORDER — METRONIDAZOLE 500 MG PO TABS: 500.0000 mg | ORAL_TABLET | Freq: Two times a day (BID) | ORAL | 2 refills | Status: DC

## 2021-09-10 MED ORDER — TINIDAZOLE 500 MG PO TABS: 2.0000 g | ORAL_TABLET | Freq: Every day | ORAL | 0 refills | Status: AC

## 2021-09-10 MED ORDER — AZITHROMYCIN 500 MG PO TABS: 1000.0000 mg | ORAL_TABLET | Freq: Once | ORAL | 0 refills | Status: DC

## 2021-09-10 MED ORDER — AZITHROMYCIN 1 G PO PACK: 1.0000 g | PACK | Freq: Once | ORAL | 0 refills | Status: AC

## 2021-09-10 NOTE — Telephone Encounter (Signed)
Patient called back, notified her of positive chlamydia and trichomonas labs.    Patient stated she has trouble taking pills.  She has requested the Zithromax in the slurry form.

## 2021-09-17 ENCOUNTER — Other Ambulatory Visit: Payer: Self-pay | Admitting: *Deleted

## 2021-09-17 ENCOUNTER — Ambulatory Visit: Payer: Medicaid Other | Attending: Obstetrics | Admitting: *Deleted

## 2021-09-17 ENCOUNTER — Ambulatory Visit (HOSPITAL_BASED_OUTPATIENT_CLINIC_OR_DEPARTMENT_OTHER): Payer: Medicaid Other | Admitting: Obstetrics and Gynecology

## 2021-09-17 ENCOUNTER — Ambulatory Visit (HOSPITAL_BASED_OUTPATIENT_CLINIC_OR_DEPARTMENT_OTHER): Payer: Medicaid Other

## 2021-09-17 ENCOUNTER — Other Ambulatory Visit: Payer: Self-pay

## 2021-09-17 VITALS — BP 123/81 | HR 90

## 2021-09-17 DIAGNOSIS — O0990 Supervision of high risk pregnancy, unspecified, unspecified trimester: Secondary | ICD-10-CM

## 2021-09-17 DIAGNOSIS — O0992 Supervision of high risk pregnancy, unspecified, second trimester: Secondary | ICD-10-CM | POA: Insufficient documentation

## 2021-09-17 DIAGNOSIS — Z3A21 21 weeks gestation of pregnancy: Secondary | ICD-10-CM | POA: Diagnosis not present

## 2021-09-17 DIAGNOSIS — O10912 Unspecified pre-existing hypertension complicating pregnancy, second trimester: Secondary | ICD-10-CM

## 2021-09-17 DIAGNOSIS — Z362 Encounter for other antenatal screening follow-up: Secondary | ICD-10-CM

## 2021-09-17 DIAGNOSIS — O285 Abnormal chromosomal and genetic finding on antenatal screening of mother: Secondary | ICD-10-CM | POA: Insufficient documentation

## 2021-09-17 DIAGNOSIS — O99212 Obesity complicating pregnancy, second trimester: Secondary | ICD-10-CM | POA: Insufficient documentation

## 2021-09-17 DIAGNOSIS — Z6841 Body Mass Index (BMI) 40.0 and over, adult: Secondary | ICD-10-CM

## 2021-09-17 DIAGNOSIS — Z363 Encounter for antenatal screening for malformations: Secondary | ICD-10-CM | POA: Insufficient documentation

## 2021-09-17 DIAGNOSIS — E669 Obesity, unspecified: Secondary | ICD-10-CM | POA: Diagnosis not present

## 2021-09-17 DIAGNOSIS — O10012 Pre-existing essential hypertension complicating pregnancy, second trimester: Secondary | ICD-10-CM | POA: Diagnosis not present

## 2021-09-17 NOTE — Progress Notes (Signed)
Maternal-Fetal Medicine   Name: Tanya Moreno DOB: November 19, 1991 MRN: [id number] Referring Provider: Nettie Elm, MD   I had the pleasure of seeing [facility] at [facility]. She is G5 P2 at 21w 2d gestation and is here for fetal anatomy scan.  Her problems include: -Chronic hypertension: Patient has chronic hypertension since delivery of her last child in August 2020 and takes labetalol 200 mg twice daily.  Blood pressure today at her office is 123/81 mmHg. -Insufficient fetal DNA on cell free fetal DNA screening.  Past medical history: No history of diabetes or any other chronic medical conditions.  She does not have sickle cell trait. Past surgical history: Nil of note. Medications: Prenatal vitamins, low-dose aspirin, labetalol 200 mg twice daily. Allergies: No known drug allergies. Social history: Denies tobacco or drug or alcohol use.  She lives with a partner who is African-American and he is in good health.  He is a father of all her children. Family history: No history of venous thromboembolism in the family. Obstetric history significant for 4 term vaginal deliveries.  All her children are in good health. GYN history: She has a history of abnormal Pap smears.  Most recent Pap smear showed negative for malignancy.  No history of breast disease.  Ultrasound We performed fetal anatomical survey.  Amniotic fluid is normal and good fetal activity seen.  Fetal biometry is consistent with the previously established dates.  No markers of aneuploidies or fetal structural defects are seen.  As maternal obesity imposes limitations of the resolution of images, fetal anomalies can be missed. Patient understands the limitations of ultrasound in detecting fetal anomalies.  Our concerns include: Chronic hypertension -Adverse outcomes of severe chronic hypertension include maternal stroke, endorgan damage, coagulation disturbances and placental abruption.  Superimposed  preeclampsia occurs in 30% of women with chronic hypertension.  I discussed the benefit of low-dose aspirin prophylaxis that helps delaying or preventing preeclampsia.  -I discussed the safety profile of antihypertensives.  Labetalol can be safely given in pregnancy.  Alternative medications include nifedipine.  -I discussed ultrasound protocol of monitoring fetal growth assessment and antenatal testing. -Timing of delivery: Provided her blood pressures are well controlled, she can be delivered at 79- or 39-weeks' gestation.  Early term delivery is an option if hypertension is not well controlled.  Insufficient fetal DNA on cell free fetal DNA screening -I explained the significance and limitations of cell free fetal DNA screening that has a greater detection for Down syndrome.  Insufficient fetal DNA screening can be associated with chromosomal anomalies up to 3%.  I discussed the option of redrawing the sample or amniocentesis for definitive diagnosis.  I explained amniocentesis procedure and possible complication of miscarriage and 1 and 500 procedures. Patient opted not to have amniocentesis or redraw sample for cell free fetal DNA screening.  Recommendations -An appointment was made for her to return in 4 weeks for completion of fetal anatomy. -Fetal growth assessments every 4 weeks. -Weekly BPP from [redacted] weeks gestation till delivery. -Delivery at 57- or 39-weeks' gestation provider blood pressures are well controlled. -Continue low-dose aspirin and labetalol.  Thank you for consultation.  If you have any questions or concerns, please contact me the [facility].  Consultation including face-to-face (more than 50%) counseling 45 minutes.

## 2021-10-02 ENCOUNTER — Encounter (HOSPITAL_COMMUNITY): Payer: Self-pay

## 2021-10-02 ENCOUNTER — Other Ambulatory Visit: Payer: Self-pay

## 2021-10-02 ENCOUNTER — Ambulatory Visit (HOSPITAL_COMMUNITY)
Admission: EM | Admit: 2021-10-02 | Discharge: 2021-10-02 | Disposition: A | Discharge status: Home / Self Care | Payer: Medicaid Other | Attending: Family Medicine | Admitting: Family Medicine

## 2021-10-02 ENCOUNTER — Emergency Department (HOSPITAL_COMMUNITY)
Admission: EM | Admit: 2021-10-02 | Discharge: 2021-10-02 | Discharge status: Against Medical Advice | Payer: Medicaid Other

## 2021-10-02 DIAGNOSIS — W19XXXA Unspecified fall, initial encounter: Secondary | ICD-10-CM | POA: Diagnosis not present

## 2021-10-02 DIAGNOSIS — M25511 Pain in right shoulder: Secondary | ICD-10-CM

## 2021-10-02 MED ORDER — LIDOCAINE 5 % EX PTCH: 1.0000 | MEDICATED_PATCH | CUTANEOUS | 0 refills | Status: DC

## 2021-10-02 NOTE — ED Provider Notes (Signed)
[facility]    CSN: [id number] Arrival date & time: 10/02/21  1200      History   Chief Complaint Chief Complaint  Patient presents with   Fall   Shoulder Pain         HPI Tanya Moreno is a 30 y.o. female.   Patient presenting today with diffuse right shoulder pain, stiffness, soreness following falling out of a chair this morning onto the shoulder.  She states she has range of motion but the whole area is sore and is very painful to move.  She denies head injury, dizziness, loss of consciousness, nausea, vomiting, numbness, tingling, weakness.  Has not tried anything thus far for symptoms.  Notes that she is [redacted] weeks pregnant, good fetal movement and no vaginal bleeding reported.  Past Medical History:  Diagnosis Date   Chlamydia    Chlamydia infection affecting pregnancy in first trimester 02/09/2017   Positive on 02/08/17, azithromycin 1000 mg PO x 1 dose sent to pt pharmacy and pt notified by phone by [facility] staff Positive on 7/30   Genital HSV    Last outbreak Nov 2013   Gonorrhea    Hypertension    Trichomonas    Patient Active Problem List   Diagnosis Date Noted   Supervision of high risk pregnancy, antepartum 06/19/2021   Chronic hypertension 07/22/2019   Abnormal Papanicolaou smear of cervix with positive human papilloma virus (HPV) test on 12/30/18 01/08/2019   Carpal tunnel syndrome during pregnancy 08/24/2017   History of HSV 04/14/2017   BMI 40.0-44.9, adult (HCC) 04/14/2017   Latex allergy 08/26/2011   Past Surgical History:  Procedure Laterality Date   NO PAST SURGERIES      OB History     Gravida  5   Para  4   Term  4   Preterm      AB      Living  4      SAB      IAB      Ectopic      Multiple  0   Live Births  4            Home Medications    Prior to Admission medications   Medication Sig Start Date End Date Taking? Authorizing Provider  lidocaine (LIDODERM) 5 % Place 1 patch onto the skin daily. Remove &  Discard patch within 12 hours or as directed by MD 10/02/21  Yes Particia Nearing, PA-C  aspirin 81 MG chewable tablet Chew 1 tablet (81 mg total) by mouth daily. 07/16/21   Brock Bad, MD  DICLEGIS 10-10 MG TBEC Take 2 tablets by mouth at bedtime. If symptoms persist, add one tablet in the morning and one in the afternoon 06/19/21   Hermina Staggers, MD  glycopyrrolate (ROBINUL) 1 MG tablet Take 1 tablet (1 mg total) by mouth 2 (two) times daily. Patient not taking: No sig reported 07/16/21   Brock Bad, MD  labetalol (NORMODYNE) 200 MG tablet Take 1 tablet (200 mg total) by mouth 2 (two) times daily. 07/16/21   Brock Bad, MD  Misc. Devices (GOJJI WEIGHT SCALE) MISC 1 Device by Does not apply route every 30 (thirty) days. 06/19/21   Hermina Staggers, MD  pantoprazole (PROTONIX) 40 MG tablet Take 1 tablet (40 mg total) by mouth daily. Patient not taking: No sig reported 07/16/21   Brock Bad, MD  Prenatal Vit-Fe Phos-FA-Omega (VITAFOL GUMMIES) 3.33-0.333-34.8 MG CHEW Dorna Bloom  3 tablets by mouth daily. 06/19/21   Hermina Staggers, MD    Family History Family History  Problem Relation Age of Onset   Hypertension Maternal Grandmother     Social History Social History   Tobacco Use   Smoking status: Former    Types: Cigarettes    Quit date: 2015    Years since quitting: 7.8   Smokeless tobacco: Former    Quit date: 11/10/2014  Vaping Use   Vaping Use: Never used  Substance Use Topics   Alcohol use: No    Alcohol/week: 0.0 standard drinks   Drug use: No     Allergies   Latex and Tape   Review of Systems Review of Systems PER HPI   Physical Exam Triage Vital Signs ED Triage Vitals  Enc Vitals Group     BP 10/02/21 1321 (!) 147/90     Pulse Rate 10/02/21 1321 83     Resp 10/02/21 1321 20     Temp 10/02/21 1321 98.4 F (36.9 C)     Temp Source 10/02/21 1321 Oral     SpO2 10/02/21 1321 100 %     Weight --      Height --      Head Circumference  --      Peak Flow --      Pain Score 10/02/21 1354 0     Pain Loc --      Pain Edu? --      Excl. in GC? --    No data found.  Updated Vital Signs BP (!) 147/90 (BP Location: Left Wrist)   Pulse 83   Temp 98.4 F (36.9 C) (Oral)   Resp 20   LMP 03/19/2021   SpO2 100%   Visual Acuity Right Eye Distance:   Left Eye Distance:   Bilateral Distance:    Right Eye Near:   Left Eye Near:    Bilateral Near:     Physical Exam Vitals and nursing note reviewed.  Constitutional:      Appearance: Normal appearance. She is not ill-appearing.  HENT:     Head: Atraumatic.  Eyes:     Extraocular Movements: Extraocular movements intact.     Conjunctiva/sclera: Conjunctivae normal.  Cardiovascular:     Rate and Rhythm: Normal rate and regular rhythm.     Heart sounds: Normal heart sounds.  Pulmonary:     Effort: Pulmonary effort is normal.     Breath sounds: Normal breath sounds.  Musculoskeletal:        General: Tenderness and signs of injury present. No swelling or deformity. Normal range of motion.     Cervical back: Normal range of motion and neck supple.     Comments: Good, though subjectively painful, range of motion at the right shoulder.  Diffuse tenderness to palpation in the musculature of the right shoulder and into the deltoid of the right arm.  Grip strength full and equal bilateral hands.  No midline spinal tenderness to palpation diffusely.    Skin:    General: Skin is warm and dry.     Findings: No bruising or erythema.  Neurological:     General: No focal deficit present.     Mental Status: She is alert and oriented to person, place, and time.     Motor: No weakness.     Gait: Gait normal.     Comments: Bilateral upper extremities neurovascularly intact  Psychiatric:        Mood and Affect:  Mood normal.        Thought Content: Thought content normal.        Judgment: Judgment normal.     UC Treatments / Results  Labs (all labs ordered are listed, but only  abnormal results are displayed) Labs Reviewed - No data to display  EKG   Radiology No results found.  Procedures Procedures (including critical care time)  Medications Ordered in UC Medications - No data to display  Initial Impression / Assessment and Plan / UC Course  I have reviewed the triage vital signs and the nursing notes.  Pertinent labs & imaging results that were available during my care of the patient were reviewed by me and considered in my medical decision making (see chart for details).     Exam very reassuring, Suspect contusion and muscular soreness with very low suspicion of bony injury.  Given her current pregnancy, will forego any x-ray imaging given this significantly low suspicion and treat with lidocaine patches topically, heat, stretches, Tylenol as needed.  Follow-up with PCP if not resolving.  Final Clinical Impressions(s) / UC Diagnoses   Final diagnoses:  Acute pain of right shoulder  Fall, initial encounter     Discharge Instructions      Use heat, massage, gentle stretches, warm baths to help with your shoulder pain and stiffness.  I have sent over lidocaine patches to help additionally with pain.  Tylenol may be taken as needed.     ED Prescriptions     Medication Sig Dispense Auth. Provider   lidocaine (LIDODERM) 5 % Place 1 patch onto the skin daily. Remove & Discard patch within 12 hours or as directed by MD 30 patch Particia Nearing, PA-C      PDMP not reviewed this encounter.   Particia Nearing, [place] 10/02/21 1906

## 2021-10-02 NOTE — ED Triage Notes (Signed)
Pt reports pain in the right shoulder since this morning after she fell backwards form a chair. Pt reports she is [redacted] weeks pregnant. Reports she did not hurt her abdomen,baby feel is moving as always.

## 2021-10-02 NOTE — Discharge Instructions (Signed)
Use heat, massage, gentle stretches, warm baths to help with your shoulder pain and stiffness.  I have sent over lidocaine patches to help additionally with pain.  Tylenol may be taken as needed.

## 2021-10-07 ENCOUNTER — Encounter: Payer: Medicaid Other | Admitting: Obstetrics

## 2021-10-10 ENCOUNTER — Encounter: Payer: Self-pay | Admitting: Obstetrics

## 2021-10-10 ENCOUNTER — Ambulatory Visit (INDEPENDENT_AMBULATORY_CARE_PROVIDER_SITE_OTHER): Payer: Medicaid Other | Admitting: Obstetrics

## 2021-10-10 ENCOUNTER — Other Ambulatory Visit: Payer: Self-pay

## 2021-10-10 VITALS — BP 143/91 | HR 90 | Wt 307.0 lb

## 2021-10-10 DIAGNOSIS — O99210 Obesity complicating pregnancy, unspecified trimester: Secondary | ICD-10-CM

## 2021-10-10 DIAGNOSIS — I10 Essential (primary) hypertension: Secondary | ICD-10-CM

## 2021-10-10 DIAGNOSIS — O0990 Supervision of high risk pregnancy, unspecified, unspecified trimester: Secondary | ICD-10-CM

## 2021-10-10 NOTE — Progress Notes (Signed)
Subjective:  Tanya Moreno is a 30 y.o. G5P4004 at [redacted]w[redacted]d being seen today for ongoing prenatal care.  She is currently monitored for the following issues for this high-risk pregnancy and has Latex allergy; History of HSV; BMI 40.0-44.9, adult (HCC); Carpal tunnel syndrome during pregnancy; Abnormal Papanicolaou smear of cervix with positive human papilloma virus (HPV) test on 12/30/18; Chronic hypertension; and Supervision of high risk pregnancy, antepartum on their problem list.  Patient reports backache and heartburn.  Contractions: Not present. Vag. Bleeding: None.  Movement: Present. Denies leaking of fluid.   The following portions of the patient's history were reviewed and updated as appropriate: allergies, current medications, past family history, past medical history, past social history, past surgical history and problem list. Problem list updated.  Objective:   Vitals:   10/10/21 1059  BP: (!) 143/91  Pulse: 90  Weight: (!) 307 lb (139.3 kg)    Fetal Status:     Movement: Present     General:  Alert, oriented and cooperative. Patient is in no acute distress.  Skin: Skin is warm and dry. No rash noted.   Cardiovascular: Normal heart rate noted  Respiratory: Normal respiratory effort, no problems with respiration noted  Abdomen: Soft, gravid, appropriate for gestational age. Pain/Pressure: Absent     Pelvic:  Cervical exam deferred        Extremities: Normal range of motion.  Edema: None  Mental Status: Normal mood and affect. Normal behavior. Normal judgment and thought content.   Urinalysis:      Assessment and Plan:  Pregnancy: G5P4004 at [redacted]w[redacted]d  1. Supervision of high risk pregnancy, antepartum  2. Chronic hypertension - taking Labetalol 200 mg po bid.  Noncompliant.   3. Obesity affecting pregnancy, antepartum   Preterm labor symptoms and general obstetric precautions including but not limited to vaginal bleeding, contractions, leaking of fluid and fetal movement  were reviewed in detail with the patient. Please refer to After Visit Summary for other counseling recommendations.   Return in about 4 weeks (around 11/07/2021) for [facility].   Brock Bad, MD  10/10/21

## 2021-10-15 ENCOUNTER — Ambulatory Visit: Payer: Medicaid Other | Attending: Obstetrics and Gynecology

## 2021-10-15 ENCOUNTER — Ambulatory Visit: Payer: Medicaid Other

## 2021-11-07 ENCOUNTER — Encounter: Payer: Medicaid Other | Admitting: Obstetrics and Gynecology

## 2021-11-07 ENCOUNTER — Encounter: Payer: Medicaid Other | Admitting: Obstetrics

## 2021-11-11 ENCOUNTER — Ambulatory Visit: Payer: Medicaid Other | Attending: Obstetrics and Gynecology

## 2021-11-11 ENCOUNTER — Other Ambulatory Visit: Payer: Self-pay

## 2021-11-11 ENCOUNTER — Ambulatory Visit: Payer: Medicaid Other | Admitting: *Deleted

## 2021-11-11 VITALS — BP 126/67 | HR 98

## 2021-11-11 DIAGNOSIS — O99212 Obesity complicating pregnancy, second trimester: Secondary | ICD-10-CM

## 2021-11-11 DIAGNOSIS — Z3A21 21 weeks gestation of pregnancy: Secondary | ICD-10-CM

## 2021-11-11 DIAGNOSIS — O10012 Pre-existing essential hypertension complicating pregnancy, second trimester: Secondary | ICD-10-CM

## 2021-11-11 DIAGNOSIS — O0990 Supervision of high risk pregnancy, unspecified, unspecified trimester: Secondary | ICD-10-CM

## 2021-11-11 DIAGNOSIS — Z6841 Body Mass Index (BMI) 40.0 and over, adult: Secondary | ICD-10-CM | POA: Insufficient documentation

## 2021-11-11 DIAGNOSIS — Z362 Encounter for other antenatal screening follow-up: Secondary | ICD-10-CM | POA: Diagnosis present

## 2021-11-11 DIAGNOSIS — O10912 Unspecified pre-existing hypertension complicating pregnancy, second trimester: Secondary | ICD-10-CM | POA: Diagnosis present

## 2021-11-11 DIAGNOSIS — E669 Obesity, unspecified: Secondary | ICD-10-CM | POA: Diagnosis not present

## 2021-11-12 ENCOUNTER — Other Ambulatory Visit: Payer: Self-pay | Admitting: *Deleted

## 2021-11-12 DIAGNOSIS — O10913 Unspecified pre-existing hypertension complicating pregnancy, third trimester: Secondary | ICD-10-CM

## 2021-11-18 ENCOUNTER — Other Ambulatory Visit: Payer: Self-pay

## 2021-11-18 ENCOUNTER — Ambulatory Visit (INDEPENDENT_AMBULATORY_CARE_PROVIDER_SITE_OTHER): Payer: Medicaid Other | Admitting: Obstetrics and Gynecology

## 2021-11-18 VITALS — BP 125/79 | HR 97 | Wt 308.0 lb

## 2021-11-18 DIAGNOSIS — O0990 Supervision of high risk pregnancy, unspecified, unspecified trimester: Secondary | ICD-10-CM

## 2021-11-18 DIAGNOSIS — I10 Essential (primary) hypertension: Secondary | ICD-10-CM

## 2021-11-18 DIAGNOSIS — Z3A30 30 weeks gestation of pregnancy: Secondary | ICD-10-CM

## 2021-11-18 NOTE — Progress Notes (Addendum)
° °  PRENATAL VISIT NOTE  Subjective:  Tanya Moreno is a 30 y.o. G5P4004 at [redacted]w[redacted]d being seen today for ongoing prenatal care.  She is currently monitored for the following issues for this high-risk pregnancy and has Latex allergy; History of HSV; BMI 50.0-59.9, adult (HCC); Carpal tunnel syndrome during pregnancy; Abnormal Papanicolaou smear of cervix with positive human papilloma virus (HPV) test on 12/30/18; Chronic hypertension; Supervision of high risk pregnancy, antepartum; and [redacted] weeks gestation of pregnancy on their problem list.  Patient doing well with no acute concerns today. She reports no complaints.  Contractions: Not present. Vag. Bleeding: None.  Movement: Present. Denies leaking of fluid.   The following portions of the patient's history were reviewed and updated as appropriate: allergies, current medications, past family history, past medical history, past social history, past surgical history and problem list. Problem list updated.  Objective:   Vitals:   11/18/21 0958  BP: 125/79  Pulse: 97  Weight: (!) 308 lb (139.7 kg)    Fetal Status: Fetal Heart Rate (bpm): 144   Movement: Present     General:  Alert, oriented and cooperative. Patient is in no acute distress.  Skin: Skin is warm and dry. No rash noted.   Cardiovascular: Normal heart rate noted  Respiratory: Normal respiratory effort, no problems with respiration noted  Abdomen: Soft, gravid, appropriate for gestational age.  Pain/Pressure: Present     Pelvic: Cervical exam deferred        Extremities: Normal range of motion.  Edema: None  Mental Status:  Normal mood and affect. Normal behavior. Normal judgment and thought content.   Assessment and Plan:  Pregnancy: G5P4004 at [redacted]w[redacted]d  1. [redacted] weeks gestation of pregnancy   2. Chronic hypertension Pt notes compliance with labetalol, BP WNL today  3. Supervision of high risk pregnancy, antepartum Continue routine care, will get 2 hour GTT  ASAP as lab only  appt  Preterm labor symptoms and general obstetric precautions including but not limited to vaginal bleeding, contractions, leaking of fluid and fetal movement were reviewed in detail with the patient.  Please refer to After Visit Summary for other counseling recommendations.   Return in about 2 weeks (around 12/02/2021) for [facility], in person.   Mariel Aloe, MD Faculty Attending Center for [facility]

## 2021-11-18 NOTE — Progress Notes (Signed)
ROB 30.1 wks, has not had GTT, rescheduled 28 wk appt from 11/07/21 Will schedule lab only visit ASAP. No complaints

## 2021-11-20 ENCOUNTER — Other Ambulatory Visit: Payer: Medicaid Other

## 2021-11-25 ENCOUNTER — Other Ambulatory Visit: Payer: Self-pay

## 2021-11-25 ENCOUNTER — Other Ambulatory Visit: Payer: Medicaid Other

## 2021-11-25 DIAGNOSIS — O0990 Supervision of high risk pregnancy, unspecified, unspecified trimester: Secondary | ICD-10-CM

## 2021-11-25 DIAGNOSIS — Z3A31 31 weeks gestation of pregnancy: Secondary | ICD-10-CM

## 2021-11-26 LAB — CBC
Hematocrit: 35 % (ref 34.0–46.6)
Hemoglobin: 12.2 g/dL (ref 11.1–15.9)
MCH: 30.8 pg (ref 26.6–33.0)
MCHC: 34.9 g/dL (ref 31.5–35.7)
MCV: 88 fL (ref 79–97)
Platelets: 224 10*3/uL (ref 150–450)
RBC: 3.96 x10E6/uL (ref 3.77–5.28)
RDW: 11.7 % (ref 11.7–15.4)
WBC: 8.1 10*3/uL (ref 3.4–10.8)

## 2021-11-26 LAB — GLUCOSE TOLERANCE, 2 HOURS W/ 1HR
Glucose, 1 hour: 172 mg/dL (ref 70–179)
Glucose, 2 hour: 132 mg/dL (ref 70–152)
Glucose, Fasting: 92 mg/dL — ABNORMAL HIGH (ref 70–91)

## 2021-11-26 LAB — HIV ANTIBODY (ROUTINE TESTING W REFLEX): HIV Screen 4th Generation wRfx: NONREACTIVE

## 2021-11-26 LAB — RPR: RPR Ser Ql: NONREACTIVE

## 2021-11-27 ENCOUNTER — Other Ambulatory Visit: Payer: Self-pay | Admitting: *Deleted

## 2021-11-27 DIAGNOSIS — O0990 Supervision of high risk pregnancy, unspecified, unspecified trimester: Secondary | ICD-10-CM

## 2021-11-27 MED ORDER — ACCU-CHEK GUIDE W/DEVICE KIT: PACK | 0 refills | Status: DC

## 2021-11-27 MED ORDER — ACCU-CHEK SOFTCLIX LANCETS MISC: 5 refills | Status: DC

## 2021-11-27 MED ORDER — ACCU-CHEK GUIDE VI STRP: ORAL_STRIP | 5 refills | Status: DC

## 2021-11-27 NOTE — Progress Notes (Signed)
Orders placed for new +GDM.  Glucose supplies and N&D referral   See lab results

## 2021-12-02 ENCOUNTER — Other Ambulatory Visit: Payer: Self-pay

## 2021-12-02 ENCOUNTER — Ambulatory Visit: Payer: Medicaid Other | Admitting: *Deleted

## 2021-12-02 ENCOUNTER — Ambulatory Visit: Payer: Medicaid Other | Attending: Obstetrics

## 2021-12-02 VITALS — BP 137/79 | HR 96

## 2021-12-02 DIAGNOSIS — O0990 Supervision of high risk pregnancy, unspecified, unspecified trimester: Secondary | ICD-10-CM | POA: Diagnosis present

## 2021-12-02 DIAGNOSIS — O10913 Unspecified pre-existing hypertension complicating pregnancy, third trimester: Secondary | ICD-10-CM | POA: Insufficient documentation

## 2021-12-03 ENCOUNTER — Encounter: Payer: Self-pay | Admitting: Nurse Practitioner

## 2021-12-03 ENCOUNTER — Telehealth (INDEPENDENT_AMBULATORY_CARE_PROVIDER_SITE_OTHER): Payer: Medicaid Other | Admitting: Nurse Practitioner

## 2021-12-03 VITALS — BP 140/90 | HR 62 | Wt 308.0 lb

## 2021-12-03 DIAGNOSIS — I10 Essential (primary) hypertension: Secondary | ICD-10-CM

## 2021-12-03 DIAGNOSIS — O24419 Gestational diabetes mellitus in pregnancy, unspecified control: Secondary | ICD-10-CM | POA: Insufficient documentation

## 2021-12-03 DIAGNOSIS — O163 Unspecified maternal hypertension, third trimester: Secondary | ICD-10-CM

## 2021-12-03 DIAGNOSIS — O24410 Gestational diabetes mellitus in pregnancy, diet controlled: Secondary | ICD-10-CM

## 2021-12-03 DIAGNOSIS — O0993 Supervision of high risk pregnancy, unspecified, third trimester: Secondary | ICD-10-CM

## 2021-12-03 DIAGNOSIS — O0990 Supervision of high risk pregnancy, unspecified, unspecified trimester: Secondary | ICD-10-CM

## 2021-12-03 DIAGNOSIS — O99210 Obesity complicating pregnancy, unspecified trimester: Secondary | ICD-10-CM

## 2021-12-03 DIAGNOSIS — O99213 Obesity complicating pregnancy, third trimester: Secondary | ICD-10-CM

## 2021-12-03 DIAGNOSIS — Z3A32 32 weeks gestation of pregnancy: Secondary | ICD-10-CM

## 2021-12-03 NOTE — Progress Notes (Signed)
OBSTETRICS PRENATAL VIRTUAL VISIT ENCOUNTER NOTE  Provider location: [facility] [place] [facility]   Patient location: Home  I connected with [name] on 12/03/21 at  2:30 PM EST by MyChart Video Encounter and verified that I am speaking with the correct person using two identifiers. I discussed the limitations, risks, security and privacy concerns of performing an evaluation and management service virtually and the availability of in person appointments. I also discussed with the patient that there may be a patient responsible charge related to this service. The patient expressed understanding and agreed to proceed. Subjective:  Tanya Moreno is a 30 y.o. G5P4004 at [redacted]w[redacted]d being seen today for ongoing prenatal care.  She is currently monitored for the following issues for this high-risk pregnancy and has Latex allergy; History of HSV; BMI 50.0-59.9, adult ([place]); Carpal tunnel syndrome during pregnancy; Abnormal Papanicolaou smear of cervix with positive human papilloma virus (HPV) test on 12/30/18; Chronic hypertension; Supervision of high risk pregnancy, antepartum; and Gestational diabetes on their problem list.  Patient reports no complaints.  Contractions: Not present. Vag. Bleeding: None.  Movement: Present. Denies any leaking of fluid.   The following portions of the patient's history were reviewed and updated as appropriate: allergies, current medications, past family history, past medical history, past social history, past surgical history and problem list.   Objective:   Vitals:   12/03/21 1441  BP: 140/90  Pulse: 62  Weight: (!) 308 lb (139.7 kg)    Fetal Status:     Movement: Present     General:  Alert, oriented and cooperative. Patient is in no acute distress.  Respiratory: Normal respiratory effort, no problems with respiration noted  Mental Status: Normal mood and affect. Normal behavior. Normal judgment and thought content.  Rest of physical exam  deferred due to type of encounter  Imaging: [place] MFM FETAL BPP WO NON STRESS  Result Date: 12/02/2021 ----------------------------------------------------------------------  OBSTETRICS REPORT                       (Signed Final 12/02/2021 04:12 pm) ---------------------------------------------------------------------- Patient Info  ID #:       [id number]                          D.O.B.:  1991/02/05 (30 yrs)  Name:       Tanya Moreno                 Visit Date: 12/02/2021 10:44 am ---------------------------------------------------------------------- Performed By  Attending:        Johnell Comings MD         Secondary Phy.:   Lydia  Performed By:     Margaretann Loveless     Address:          [address]  [place]  Referred By:      Chancy Milroy        [facility]         [facility]                    MD                                       [facility] at                                                             [place] [facility]                                                             Women  Ref. Address:     [place], [place] ---------------------------------------------------------------------- Orders  #  Description                           Code        Ordered By  1  [place] MFM FETAL BPP WO NON               [phone]    YU FANG     STRESS ----------------------------------------------------------------------  #  Order #                     Accession #                Episode #  1  [id number]                   [id number]                 [id number] ---------------------------------------------------------------------- Indications   Hypertension - Chronic/Pre-existing            O10.019  (Labetalol)  Gestational diabetes in pregnancy,  O24.419  unspecified control  Obesity complicating pregnancy, third          O99.213  trimester (BMI 50.44)  Abnormal chromosomal and genetic finding       O28.5  on antenatal screening of mother  (insufficient fetal DNA on NIPT)  Low FF on NIPS (No Redraw)  Negative Horizon  [redacted] weeks gestation of pregnancy                Z3A.32 ---------------------------------------------------------------------- Fetal Evaluation  Num Of Fetuses:         1  Fetal Heart Rate(bpm):  137  Cardiac Activity:       Observed  Presentation:           Cephalic  Placenta:               Posterior  P. Cord Insertion:      Previously Visualized  Amniotic Fluid  AFI FV:      Within normal limits  AFI Sum(cm)     %Tile       Largest Pocket(cm)  14.87           52          5.34  RUQ(cm)       RLQ(cm)       LUQ(cm)        LLQ(cm)  3.09          2.64          3.8            5.34 ---------------------------------------------------------------------- Biophysical Evaluation  Amniotic F.V:   Pocket => 2 cm             F. Tone:        Observed  F. Movement:    Observed                   Score:          8/8  F. Breathing:   Observed ---------------------------------------------------------------------- Biometry  LV:        3.1  mm ---------------------------------------------------------------------- OB History  Blood Type:   O+  Gravidity:    5         Term:   4        Prem:   0        SAB:   0  TOP:          0       Ectopic:  0        Living: 4 ---------------------------------------------------------------------- Gestational Age  LMP:           36w 6d        Date:  03/19/21                 EDD:   12/24/21  Best:          32w 1d     Det. By:  U/S C R L  (06/19/21)    EDD:   01/26/22 ---------------------------------------------------------------------- Anatomy  Cranium:               Appears normal         LVOT:                    Previously seen  Cavum:                 Previously seen        Aortic Arch:  Previously seen  Ventricles:            Appears normal         Ductal Arch:            Previously seen  Choroid Plexus:        Previously seen        Diaphragm:              Appears normal  Cerebellum:            Previously seen        Stomach:                Appears normal, left                                                                        sided  Posterior Fossa:       Previously seen        Abdomen:                Appears normal  Nuchal Fold:           Not applicable ([id number]    Abdominal Wall:         Previously seen                         wks GA)  Face:                  Orbits and profile     Cord Vessels:           Appears normal (3                         previously seen                                vessel cord)  Lips:                  Previously seen        Kidneys:                Appear normal  Palate:                Not well visualized    Bladder:                Appears normal  Thoracic:              Appears normal         Spine:                  Previously seen  Heart:                 Not well visualized    Upper Extremities:      Previously seen  RVOT:                  Previously seen        Lower Extremities:      Previously seen  Other:  Nasal Bone, lenses, 3VV and 3VTV previously visualized. Technically  difficult due to maternal habitus and fetal position. ---------------------------------------------------------------------- Comments  This patient was seen for a BPP due to maternal obesity,  chronic hypertension treated with labetalol, and recently  diagnosed gestational diabetes.  A biophysical profile performed today was 8 out of 8.  There was normal amniotic fluid noted on today's ultrasound  exam.  She will return in 1 week for another growth ultrasound and  BPP. ----------------------------------------------------------------------                   Johnell Comings, MD Electronically Signed Final  Report   12/02/2021 04:12 pm ----------------------------------------------------------------------  [place] MFM OB FOLLOW UP  Result Date: 11/11/2021 ----------------------------------------------------------------------  OBSTETRICS REPORT                       (Signed Final 11/11/2021 04:47 pm) ---------------------------------------------------------------------- Patient Info  ID #:       [id number]                          D.O.B.:  August 29, 1991 (30 yrs)  Name:       Tanya Moreno                 Visit Date: 11/11/2021 03:37 pm ---------------------------------------------------------------------- Performed By  Attending:        Johnell Comings MD         Secondary Phy.:   Rake  Performed By:     Rodrigo Ran BS      Address:          [address]                                                             [address]                                                             [address] [place]                                                             [place]  Referred By:      Chancy Milroy        Location:         [facility]                    MD  Fetal Care at                                                             [facility] for                                                             Women  Ref. Address:     [place], [place] ---------------------------------------------------------------------- Orders  #  Description                           Code        Ordered By  1  [place] MFM OB FOLLOW UP                   [id number]    [place] ----------------------------------------------------------------------  #  Order #                     Accession #                Episode #  1  [id number]                   [id number]                 [id number]  ---------------------------------------------------------------------- Indications  Hypertension - Chronic/Pre-existing            O10.019  (Labetalol)  Obesity complicating pregnancy, third          O99.213  trimester  [redacted] weeks gestation of pregnancy                Z3A.29  Abnormal chromosomal and genetic finding       O28.5  on antenatal screening of mother  (insufficient fetal DNA on NIPT)  [redacted] weeks gestation of pregnancy                Z3A.21  Antenatal follow-up for nonvisualized fetal    Z36.2  anatomy  Negative Horizon  Low FF on NIPS (No Redraw) ---------------------------------------------------------------------- Fetal Evaluation  Num Of Fetuses:         1  Fetal Heart Rate(bpm):  150  Cardiac Activity:       Observed  Presentation:           Cephalic  Placenta:               Posterior  P. Cord Insertion:      Previously Visualized  Amniotic Fluid  AFI FV:      Within normal limits  AFI Sum(cm)     %Tile       Largest Pocket(cm)  18.4  70          6.4  RUQ(cm)       RLQ(cm)       LUQ(cm)        LLQ(cm)  4.7           3.4           3.9            6.4 ---------------------------------------------------------------------- Biometry  BPD:      70.8  mm     G. Age:  28w 3d         18  %    CI:         71.1   %    70 - 86                                                          FL/HC:      22.6   %    19.6 - 20.8  HC:      267.5  mm     G. Age:  29w 1d         18  %    HC/AC:      1.02        0.99 - 1.21  AC:       261   mm     G. Age:  30w 2d         76  %    FL/BPD:     85.5   %    71 - 87  FL:       60.5  mm     G. Age:  31w 3d         91  %    FL/AC:      23.2   %    20 - 24  HUM:        53  mm     G. Age:  30w 6d         81  %  LV:        3.3  mm  Est. FW:    1561  gm      3 lb 7 oz     81  % ---------------------------------------------------------------------- OB History  Blood Type:   O+  Gravidity:    5         Term:   4        Prem:   0        SAB:   0  TOP:          0       Ectopic:  0         Living: 4 ---------------------------------------------------------------------- Gestational Age  LMP:           33w 6d        Date:  03/19/21                 EDD:   12/24/21  U/S Today:     29w 6d                                        EDD:   01/21/22  Best:  29w 1d     Det. By:  U/S C R L  (06/19/21)    EDD:   01/26/22 ---------------------------------------------------------------------- Anatomy  Cranium:               Appears normal         LVOT:                   Appears normal  Cavum:                 Appears normal         Aortic Arch:            Appears normal  Ventricles:            Appears normal         Ductal Arch:            Appears normal  Choroid Plexus:        Previously seen        Diaphragm:              Appears normal  Cerebellum:            Appears normal         Stomach:                Appears normal, left                                                                        sided  Posterior Fossa:       Appears normal         Abdomen:                Appears normal  Nuchal Fold:           Not applicable (>20    Abdominal Wall:         Appears nml (cord                         wks GA)                                        insert, abd wall)  Face:                  Appears normal         Cord Vessels:           Appears normal (3                         (orbits and profile)                           vessel cord)  Lips:                  Appears normal         Kidneys:                Appear normal  Palate:  Not well visualized    Bladder:                Appears normal  Thoracic:              Appears normal         Spine:                  Previously seen  Heart:                 Appears normal         Upper Extremities:      Previously seen                         (4CH, axis, and                         situs)  RVOT:                  Appears normal         Lower Extremities:      Previously seen  Other:  Nasal Bone, lenses, 3VV and 3VTV visualized. Technically difficult           due to maternal habitus and fetal position. ---------------------------------------------------------------------- Cervix Uterus Adnexa  Cervix  Not visualized (advanced GA >24wks)  Uterus  No abnormality visualized.  Right Ovary  Not visualized.  Left Ovary  Not visualized.  Cul De Sac  No free fluid seen.  Adnexa  No abnormality visualized. ---------------------------------------------------------------------- Comments  This patient was seen for a follow up exam due to maternal  obesity with a BMI of 51 and chronic hypertension treated  with labetalol.  She denies any problems since her last exam.  She was informed that the fetal growth and amniotic fluid  level appears appropriate for her gestational age.  The views of the fetal heart were visualized today.  There  were no obvious anomalies suspected.  The limitations of  ultrasound in the detection of all anomalies was discussed.  Due to obesity and chronic hypertension, we will start weekly  fetal testing at 32 weeks.  A BPP was scheduled in 3 weeks. ----------------------------------------------------------------------                   Johnell Comings, MD Electronically Signed Final Report   11/11/2021 04:47 pm ----------------------------------------------------------------------   Assessment and Plan:  Pregnancy: [id number] at [redacted]w[redacted]d 1. Supervision of high risk pregnancy, antepartum Reports baby is moving well Message sent to admin staff to schedule appt in 2 weeks  2.  Chronic hypertension Taking labatelol BID - reports she took it today - noted BP at home today is 140/90 No headaches  2. Diet controlled gestational diabetes mellitus (GDM) in third trimester Will pick up supplies Has appt with dietician scheduled - has not had GDM previously  3. [redacted] weeks gestation of pregnancy   Preterm labor symptoms and general obstetric precautions including but not limited to vaginal bleeding, contractions, leaking of fluid and fetal movement were reviewed  in detail with the patient. I discussed the assessment and treatment plan with the patient. The patient was provided an opportunity to ask questions and all were answered. The patient agreed with the plan and demonstrated an understanding of the instructions. The patient was advised to call back or seek an in-person office evaluation/go to [facility]U at [facility] for any urgent or concerning symptoms. Please  refer to After Visit Summary for other counseling recommendations.   I provided 5 minutes of face-to-face time during this encounter.  Return in about 2 weeks (around 12/17/2021) for In person ROB.  Future Appointments  Date Time Provider [place]  12/10/2021  3:00 PM [facility] NURSE [facility]  12/10/2021  3:15 PM [facility] US2 [facility]  12/16/2021  3:15 PM [facility] NURSE [facility]  12/16/2021  3:30 PM [facility] US3 [facility]  12/17/2021  9:00 AM [facility] GDM CLASS [facility] NDM  12/22/2021  2:50 PM Chancy Milroy, MD [place] None  12/23/2021  3:00 PM [facility] NURSE [facility]  12/23/2021  3:15 PM [facility] US2 [facility] [place], NP Center for [employer], [place]

## 2021-12-03 NOTE — Progress Notes (Signed)
+   Fetal movement. Pt has not picked up supplies to start checking glucose yet.

## 2021-12-07 NOTE — L&D Delivery Note (Signed)
OB/GYN Faculty Practice Delivery Note  DARSHA ZUMSTEIN is a 31 y.o. T0G2694 s/p SVD at [redacted]w[redacted]d. She was admitted for IOL due to [facility] and A1GDM.   ROM: 2h 61m with clear fluid GBS Status: GBS positive; received PCN  Delivery Date/Time: 01/19/22 at 1811  Delivery: Called to room by RN and patient pushing with fetal head crowning. Head delivered occiput anterior. No nuchal cord present. Shoulder and body delivered in usual fashion. Infant with spontaneous cry, placed on mother's abdomen, dried and stimulated. Cord clamped x 2 after 1-minute delay and cut by FOB under my direct supervision. Cord blood drawn. Placenta delivered spontaneously with gentle cord traction. Fundus firm with massage and Pitocin. Labia, perineum, vagina, and cervix were inspected, and no lacerations were noted.   Placenta: Intact, 3VC - sent to L&D Complications: None  Lacerations: None  EBL: 75 cc Analgesia: None   Infant: Viable female   APGARs 9 and 9   3430 g  Evalina Field, MD OB/GYN Fellow, Faculty Practice

## 2021-12-10 ENCOUNTER — Ambulatory Visit: Payer: Medicaid Other | Admitting: *Deleted

## 2021-12-10 ENCOUNTER — Ambulatory Visit: Payer: Medicaid Other | Attending: Obstetrics

## 2021-12-10 ENCOUNTER — Other Ambulatory Visit: Payer: Self-pay

## 2021-12-10 VITALS — BP 126/77 | HR 108

## 2021-12-10 DIAGNOSIS — O24419 Gestational diabetes mellitus in pregnancy, unspecified control: Secondary | ICD-10-CM

## 2021-12-10 DIAGNOSIS — O0990 Supervision of high risk pregnancy, unspecified, unspecified trimester: Secondary | ICD-10-CM | POA: Insufficient documentation

## 2021-12-10 DIAGNOSIS — O99213 Obesity complicating pregnancy, third trimester: Secondary | ICD-10-CM

## 2021-12-10 DIAGNOSIS — O10013 Pre-existing essential hypertension complicating pregnancy, third trimester: Secondary | ICD-10-CM | POA: Diagnosis not present

## 2021-12-10 DIAGNOSIS — O10913 Unspecified pre-existing hypertension complicating pregnancy, third trimester: Secondary | ICD-10-CM | POA: Diagnosis present

## 2021-12-10 DIAGNOSIS — E669 Obesity, unspecified: Secondary | ICD-10-CM | POA: Diagnosis not present

## 2021-12-10 DIAGNOSIS — Z3A33 33 weeks gestation of pregnancy: Secondary | ICD-10-CM

## 2021-12-11 ENCOUNTER — Other Ambulatory Visit: Payer: Self-pay | Admitting: *Deleted

## 2021-12-11 DIAGNOSIS — O10913 Unspecified pre-existing hypertension complicating pregnancy, third trimester: Secondary | ICD-10-CM

## 2021-12-16 ENCOUNTER — Other Ambulatory Visit: Payer: Self-pay

## 2021-12-16 ENCOUNTER — Ambulatory Visit: Payer: Medicaid Other | Attending: Obstetrics

## 2021-12-16 ENCOUNTER — Ambulatory Visit: Payer: Medicaid Other | Admitting: *Deleted

## 2021-12-16 VITALS — BP 127/70 | HR 94

## 2021-12-16 DIAGNOSIS — O10013 Pre-existing essential hypertension complicating pregnancy, third trimester: Secondary | ICD-10-CM

## 2021-12-16 DIAGNOSIS — O0990 Supervision of high risk pregnancy, unspecified, unspecified trimester: Secondary | ICD-10-CM | POA: Diagnosis present

## 2021-12-16 DIAGNOSIS — E669 Obesity, unspecified: Secondary | ICD-10-CM

## 2021-12-16 DIAGNOSIS — Z3A34 34 weeks gestation of pregnancy: Secondary | ICD-10-CM | POA: Diagnosis not present

## 2021-12-16 DIAGNOSIS — O10913 Unspecified pre-existing hypertension complicating pregnancy, third trimester: Secondary | ICD-10-CM | POA: Diagnosis not present

## 2021-12-16 DIAGNOSIS — O99213 Obesity complicating pregnancy, third trimester: Secondary | ICD-10-CM | POA: Diagnosis not present

## 2021-12-17 ENCOUNTER — Ambulatory Visit: Payer: Medicaid Other | Admitting: Registered"

## 2021-12-22 ENCOUNTER — Ambulatory Visit: Payer: Medicaid Other | Admitting: Registered"

## 2021-12-22 ENCOUNTER — Ambulatory Visit (INDEPENDENT_AMBULATORY_CARE_PROVIDER_SITE_OTHER): Payer: Medicaid Other | Admitting: Obstetrics and Gynecology

## 2021-12-22 ENCOUNTER — Other Ambulatory Visit (HOSPITAL_COMMUNITY)
Admission: RE | Admit: 2021-12-22 | Discharge: 2021-12-22 | Disposition: A | Discharge status: Home / Self Care | Payer: Medicaid Other | Source: Ambulatory Visit | Attending: Obstetrics and Gynecology | Admitting: Obstetrics and Gynecology

## 2021-12-22 ENCOUNTER — Encounter: Payer: Self-pay | Admitting: Obstetrics and Gynecology

## 2021-12-22 ENCOUNTER — Other Ambulatory Visit: Payer: Self-pay

## 2021-12-22 VITALS — BP 139/90 | HR 91 | Wt 309.3 lb

## 2021-12-22 DIAGNOSIS — O24410 Gestational diabetes mellitus in pregnancy, diet controlled: Secondary | ICD-10-CM

## 2021-12-22 DIAGNOSIS — O0990 Supervision of high risk pregnancy, unspecified, unspecified trimester: Secondary | ICD-10-CM

## 2021-12-22 DIAGNOSIS — O10919 Unspecified pre-existing hypertension complicating pregnancy, unspecified trimester: Secondary | ICD-10-CM

## 2021-12-22 DIAGNOSIS — A599 Trichomoniasis, unspecified: Secondary | ICD-10-CM | POA: Insufficient documentation

## 2021-12-22 LAB — OB RESULTS CONSOLE GC/CHLAMYDIA: Gonorrhea: NEGATIVE

## 2021-12-22 NOTE — Progress Notes (Signed)
ROB 35 wks BP 139/90, Denies HA, blurry vision, dizziness. Reports heartburn. Taking Tums with relief. GDM, does not have CBG log

## 2021-12-22 NOTE — Patient Instructions (Signed)

## 2021-12-22 NOTE — Progress Notes (Signed)
Subjective:  Tanya Moreno is a 31 y.o. G5P4004 at [redacted]w[redacted]d being seen today for ongoing prenatal care.  She is currently monitored for the following issues for this high-risk pregnancy and has Latex allergy; History of HSV; BMI 50.0-59.9, adult ([place]); Carpal tunnel syndrome during pregnancy; Chronic hypertension; Supervision of high risk pregnancy, antepartum; Gestational diabetes; Chronic hypertension during pregnancy; and Trichomoniasis on their problem list.  Patient reports no complaints.  Contractions: Not present. Vag. Bleeding: None.  Movement: Present. Denies leaking of fluid.   The following portions of the patient's history were reviewed and updated as appropriate: allergies, current medications, past family history, past medical history, past social history, past surgical history and problem list. Problem list updated.  Objective:   Vitals:   12/22/21 1451  BP: 139/90  Pulse: 91  Weight: (!) 309 lb 4.8 oz (140.3 kg)    Fetal Status: Fetal Heart Rate (bpm): 130   Movement: Present     General:  Alert, oriented and cooperative. Patient is in no acute distress.  Skin: Skin is warm and dry. No rash noted.   Cardiovascular: Normal heart rate noted  Respiratory: Normal respiratory effort, no problems with respiration noted  Abdomen: Soft, gravid, appropriate for gestational age. Pain/Pressure: Present     Pelvic:  Cervical exam deferred        Extremities: Normal range of motion.  Edema: None  Mental Status: Normal mood and affect. Normal behavior. Normal judgment and thought content.   Urinalysis:      Assessment and Plan:  Pregnancy: [id number] at [redacted]w[redacted]d  1. Supervision of high risk pregnancy, antepartum Stable Vaginal swabs and GBS next visit  2. Diet controlled gestational diabetes mellitus (GDM) in third trimester Pt reports CBG's in goal range Did not bring readings. Encourage to bring to all OB appts Serial growth scans and antenatal testing as per MFM 87 % on  12/10/21 Schedule IOL for 39 weeks at next visit  3. Chronic hypertension during pregnancy BP stable Continue with Labetalol MFM U/S and IOL as above  4. Trichomoniasis Self swab today  Preterm labor symptoms and general obstetric precautions including but not limited to vaginal bleeding, contractions, leaking of fluid and fetal movement were reviewed in detail with the patient. Please refer to After Visit Summary for other counseling recommendations.  Return in about 1 year (around 12/22/2022) for OB visit, face to face, MD only.   Chancy Milroy, MD

## 2021-12-23 ENCOUNTER — Ambulatory Visit: Payer: Medicaid Other

## 2021-12-24 ENCOUNTER — Ambulatory Visit: Payer: Medicaid Other

## 2021-12-24 ENCOUNTER — Ambulatory Visit: Payer: Medicaid Other | Admitting: Nutrition

## 2021-12-24 ENCOUNTER — Ambulatory Visit: Payer: Medicaid Other | Attending: Obstetrics

## 2021-12-26 LAB — CERVICOVAGINAL ANCILLARY ONLY
Candida Glabrata: NEGATIVE
Candida Vaginitis: NEGATIVE
Chlamydia: NEGATIVE
Comment: NEGATIVE
Comment: NEGATIVE
Comment: NEGATIVE
Comment: NEGATIVE
Comment: NORMAL
Neisseria Gonorrhea: NEGATIVE
Trichomonas: POSITIVE — AB

## 2021-12-29 ENCOUNTER — Other Ambulatory Visit: Payer: Self-pay | Admitting: *Deleted

## 2021-12-29 ENCOUNTER — Encounter: Payer: Self-pay | Admitting: *Deleted

## 2021-12-29 DIAGNOSIS — A599 Trichomoniasis, unspecified: Secondary | ICD-10-CM

## 2021-12-29 MED ORDER — METRONIDAZOLE 500 MG PO TABS: 500.0000 mg | ORAL_TABLET | Freq: Two times a day (BID) | ORAL | 0 refills | Status: DC

## 2021-12-29 NOTE — Progress Notes (Signed)
Patient notified of positive trich by Dr. Alysia Penna and via MyChart message. Information on trich included in message. RX sent for Flagyl.

## 2021-12-30 ENCOUNTER — Ambulatory Visit: Payer: Medicaid Other | Attending: Obstetrics

## 2021-12-30 ENCOUNTER — Other Ambulatory Visit: Payer: Self-pay

## 2021-12-30 ENCOUNTER — Ambulatory Visit: Payer: Medicaid Other | Admitting: *Deleted

## 2021-12-30 ENCOUNTER — Encounter: Payer: Medicaid Other | Attending: Obstetrics and Gynecology | Admitting: Nutrition

## 2021-12-30 VITALS — BP 131/84 | HR 89

## 2021-12-30 DIAGNOSIS — O24419 Gestational diabetes mellitus in pregnancy, unspecified control: Secondary | ICD-10-CM | POA: Insufficient documentation

## 2021-12-30 DIAGNOSIS — O99213 Obesity complicating pregnancy, third trimester: Secondary | ICD-10-CM | POA: Diagnosis not present

## 2021-12-30 DIAGNOSIS — O24410 Gestational diabetes mellitus in pregnancy, diet controlled: Secondary | ICD-10-CM

## 2021-12-30 DIAGNOSIS — O0990 Supervision of high risk pregnancy, unspecified, unspecified trimester: Secondary | ICD-10-CM | POA: Insufficient documentation

## 2021-12-30 DIAGNOSIS — Z3A36 36 weeks gestation of pregnancy: Secondary | ICD-10-CM

## 2021-12-30 DIAGNOSIS — O10913 Unspecified pre-existing hypertension complicating pregnancy, third trimester: Secondary | ICD-10-CM | POA: Diagnosis present

## 2021-12-30 DIAGNOSIS — O10013 Pre-existing essential hypertension complicating pregnancy, third trimester: Secondary | ICD-10-CM | POA: Diagnosis not present

## 2021-12-30 NOTE — Progress Notes (Signed)
Patient is here to review her diet.  She reports being [redacted] weeks pregnant,and on no diabetes medications. SBGM:  Pt reports being out of test strips X1 week.  Went to the drug store this AM and purchased more.  Had them in a bag in my office.  I asked if she needed assistance with using the accucheck meter, but she said no. Discussed goals for blood sugar readings:  acB: less than 90. And 2hr. Pc: less than 120.   Typical day: 6:30AM: up drinks water 8:30 Bfast:  2 pancake sausage sticks with water to drink 10AM: [place] sandwich with cheese and mustard, regular soda 12AM:  Burger with cheese and handfull of chips 3PM: can of Ravioli, with 6 Saltine crackers, 1 cup of juice-fruit            Punch 5PM: 2 chicken wings and rice and corn with macaroni and             Cheese, water to drink 9:30 repeat above.  Always leftover from supper 10-10:30 sleep 4AM:  up, drinks regular soda or juice and goes back to bed.,  Discussion: Needs to:  Stop all fruit juices and regular sodas now.  List of other acceptable drinks given to her Do not have both rice and pasta in the same meal.  Have one of these, and have a non starchy veg., like broccoli, or squash, or greens or green beens.   Test blood sugar before breakfast, and 2hr. After lunch meals.  If morning blood sugar is over 90, or if 2hr. After meals are over 120, call the doctor's office and let them know.  Patient reports that she can stop all juices and regular sodas, and limit her starches at supper to either rice or pasta, but not both.

## 2021-12-30 NOTE — Patient Instructions (Addendum)
Stop all fruit juices and regular sodas now.  List of other acceptable drinks given to her Do not have both rice and pasta.  Have one of these, and have a non starchy veg., like broccoli, or squash, or greens or green beens.   Test blood sugar before breakfast, and 2hr. After lunch meals.  If morning blood sugar is over 90, or if 2hr. After meals are over 120, call the doctor's office and let them know.

## 2021-12-31 NOTE — Progress Notes (Signed)
Patient was assessed and managed by nursing staff during this encounter. I have reviewed the chart and agree with the documentation and plan. I have also made any necessary editorial changes.  Warden Fillers, MD 12/31/2021 8:28 AM

## 2022-01-01 ENCOUNTER — Ambulatory Visit (INDEPENDENT_AMBULATORY_CARE_PROVIDER_SITE_OTHER): Payer: Medicaid Other | Admitting: Obstetrics

## 2022-01-01 ENCOUNTER — Encounter: Payer: Medicaid Other | Admitting: Obstetrics & Gynecology

## 2022-01-01 ENCOUNTER — Encounter: Payer: Self-pay | Admitting: Obstetrics

## 2022-01-01 ENCOUNTER — Other Ambulatory Visit: Payer: Self-pay

## 2022-01-01 VITALS — BP 131/80 | HR 97 | Wt 311.0 lb

## 2022-01-01 DIAGNOSIS — O99210 Obesity complicating pregnancy, unspecified trimester: Secondary | ICD-10-CM

## 2022-01-01 DIAGNOSIS — O0993 Supervision of high risk pregnancy, unspecified, third trimester: Secondary | ICD-10-CM

## 2022-01-01 DIAGNOSIS — I10 Essential (primary) hypertension: Secondary | ICD-10-CM

## 2022-01-01 DIAGNOSIS — Z3A36 36 weeks gestation of pregnancy: Secondary | ICD-10-CM

## 2022-01-01 DIAGNOSIS — O0990 Supervision of high risk pregnancy, unspecified, unspecified trimester: Secondary | ICD-10-CM

## 2022-01-01 DIAGNOSIS — O99213 Obesity complicating pregnancy, third trimester: Secondary | ICD-10-CM

## 2022-01-01 DIAGNOSIS — O24410 Gestational diabetes mellitus in pregnancy, diet controlled: Secondary | ICD-10-CM

## 2022-01-01 NOTE — Progress Notes (Signed)
Subjective:  Tanya Moreno is a 31 y.o. G5P4004 at [redacted]w[redacted]d being seen today for ongoing prenatal care.  She is currently monitored for the following issues for this high-risk pregnancy and has Latex allergy; History of HSV; BMI 50.0-59.9, adult ([place]); Carpal tunnel syndrome during pregnancy; Chronic hypertension; Supervision of high risk pregnancy, antepartum; Gestational diabetes; Chronic hypertension during pregnancy; and Trichomoniasis on their problem list.  Patient reports no complaints.  Contractions: Not present. Vag. Bleeding: None.  Movement: Present. Denies leaking of fluid.   The following portions of the patient's history were reviewed and updated as appropriate: allergies, current medications, past family history, past medical history, past social history, past surgical history and problem list. Problem list updated.  Objective:   Vitals:   01/01/22 1116  BP: 131/80  Pulse: 97  Weight: (!) 311 lb (141.1 kg)    Fetal Status: Fetal Heart Rate (bpm): 128   Movement: Present     General:  Alert, oriented and cooperative. Patient is in no acute distress.  Skin: Skin is warm and dry. No rash noted.   Cardiovascular: Normal heart rate noted  Respiratory: Normal respiratory effort, no problems with respiration noted  Abdomen: Soft, gravid, appropriate for gestational age. Pain/Pressure: Present     Pelvic:  Cervical exam deferred        Extremities: Normal range of motion.  Edema: Trace  Mental Status: Normal mood and affect. Normal behavior. Normal judgment and thought content.   Urinalysis:      Assessment and Plan:  Pregnancy: G5P4004 at [redacted]w[redacted]d  1. Supervision of high risk pregnancy, antepartum Rx: - Culture, beta strep (group b only)  2. Diet controlled gestational diabetes mellitus (GDM) in third trimester - good glucose contrrol:  FBS's < 95 and 2 hour PP's < 120  3. Chronic hypertension - BP clinical;ly stable  4. Obesity affecting pregnancy, antepartum     Preterm labor symptoms and general obstetric precautions including but not limited to vaginal bleeding, contractions, leaking of fluid and fetal movement were reviewed in detail with the patient. Please refer to After Visit Summary for other counseling recommendations.   Return in about 1 week (around 01/08/2022) for [facility].   Shelly Bombard, MD

## 2022-01-01 NOTE — Progress Notes (Signed)
ROB GBS 

## 2022-01-05 LAB — CULTURE, BETA STREP (GROUP B ONLY): Strep Gp B Culture: POSITIVE — AB

## 2022-01-06 ENCOUNTER — Ambulatory Visit: Payer: Medicaid Other

## 2022-01-08 ENCOUNTER — Other Ambulatory Visit: Payer: Self-pay

## 2022-01-08 ENCOUNTER — Encounter: Payer: Self-pay | Admitting: *Deleted

## 2022-01-08 ENCOUNTER — Ambulatory Visit: Payer: Medicaid Other | Attending: Obstetrics

## 2022-01-08 ENCOUNTER — Ambulatory Visit: Payer: Medicaid Other | Admitting: *Deleted

## 2022-01-08 VITALS — BP 149/83 | HR 89

## 2022-01-08 DIAGNOSIS — O99213 Obesity complicating pregnancy, third trimester: Secondary | ICD-10-CM

## 2022-01-08 DIAGNOSIS — O0990 Supervision of high risk pregnancy, unspecified, unspecified trimester: Secondary | ICD-10-CM | POA: Insufficient documentation

## 2022-01-08 DIAGNOSIS — O10013 Pre-existing essential hypertension complicating pregnancy, third trimester: Secondary | ICD-10-CM

## 2022-01-08 DIAGNOSIS — O10913 Unspecified pre-existing hypertension complicating pregnancy, third trimester: Secondary | ICD-10-CM | POA: Diagnosis not present

## 2022-01-08 DIAGNOSIS — E668 Other obesity: Secondary | ICD-10-CM | POA: Diagnosis not present

## 2022-01-08 DIAGNOSIS — O24410 Gestational diabetes mellitus in pregnancy, diet controlled: Secondary | ICD-10-CM

## 2022-01-08 DIAGNOSIS — Z3A37 37 weeks gestation of pregnancy: Secondary | ICD-10-CM

## 2022-01-12 ENCOUNTER — Other Ambulatory Visit: Payer: Self-pay

## 2022-01-12 ENCOUNTER — Ambulatory Visit (INDEPENDENT_AMBULATORY_CARE_PROVIDER_SITE_OTHER): Payer: Medicaid Other | Admitting: Obstetrics and Gynecology

## 2022-01-12 ENCOUNTER — Encounter: Payer: Self-pay | Admitting: Obstetrics and Gynecology

## 2022-01-12 VITALS — BP 132/84 | HR 91 | Wt 312.0 lb

## 2022-01-12 DIAGNOSIS — O0990 Supervision of high risk pregnancy, unspecified, unspecified trimester: Secondary | ICD-10-CM

## 2022-01-12 DIAGNOSIS — O24410 Gestational diabetes mellitus in pregnancy, diet controlled: Secondary | ICD-10-CM

## 2022-01-12 DIAGNOSIS — O10919 Unspecified pre-existing hypertension complicating pregnancy, unspecified trimester: Secondary | ICD-10-CM

## 2022-01-12 DIAGNOSIS — Z22330 Carrier of Group B streptococcus: Secondary | ICD-10-CM

## 2022-01-12 NOTE — Progress Notes (Signed)
° °  PRENATAL VISIT NOTE  Subjective:  Tanya Moreno is a 31 y.o. G5P4004 at [redacted]w[redacted]d being seen today for ongoing prenatal care.  She is currently monitored for the following issues for this high-risk pregnancy and has Latex allergy; History of HSV; BMI 50.0-59.9, adult ([facility]); Carpal tunnel syndrome during pregnancy; Chronic hypertension; Supervision of high risk pregnancy, antepartum; Gestational diabetes; Chronic hypertension during pregnancy; Trichomoniasis; and GBS carrier on their problem list.  Patient reports no complaints.  Contractions: Not present. Vag. Bleeding: None.  Movement: Present. Denies leaking of fluid.   The following portions of the patient's history were reviewed and updated as appropriate: allergies, current medications, past family history, past medical history, past social history, past surgical history and problem list.   Objective:   Vitals:   01/12/22 1553  BP: 132/84  Pulse: 91  Weight: (!) 312 lb (141.5 kg)    Fetal Status: Fetal Heart Rate (bpm): 146 Fundal Height: 40 cm Movement: Present     General:  Alert, oriented and cooperative. Patient is in no acute distress.  Skin: Skin is warm and dry. No rash noted.   Cardiovascular: Normal heart rate noted  Respiratory: Normal respiratory effort, no problems with respiration noted  Abdomen: Soft, gravid, appropriate for gestational age.  Pain/Pressure: Absent     Pelvic: Cervical exam deferred        Extremities: Normal range of motion.  Edema: Trace  Mental Status: Normal mood and affect. Normal behavior. Normal judgment and thought content.   Assessment and Plan:  Pregnancy: G5P4004 at 104w0d 1. Supervision of high risk pregnancy, antepartum Patient is doing well without complaints   2. Chronic hypertension during pregnancy BP stable Continue labetalol Discussed IOL at 38-39 weeks and patient desires IOL at 39 weeks- orders in Epic  3. Diet controlled gestational diabetes mellitus (GDM) in third  trimester Patient did not not bring log but reports values in the normal range  4. GBS carrier Prophylaxis in labor  Term labor symptoms and general obstetric precautions including but not limited to vaginal bleeding, contractions, leaking of fluid and fetal movement were reviewed in detail with the patient. Please refer to After Visit Summary for other counseling recommendations.   No follow-ups on file.  No future appointments.  Catalina Antigua, MD

## 2022-01-12 NOTE — Progress Notes (Signed)
Pt reports fetal movement, denies pain. Pt states that she did not bring BG log today but her readings have been good at home.

## 2022-01-13 ENCOUNTER — Encounter (HOSPITAL_COMMUNITY): Payer: Self-pay | Admitting: *Deleted

## 2022-01-13 ENCOUNTER — Other Ambulatory Visit: Payer: Self-pay | Admitting: Family Medicine

## 2022-01-13 ENCOUNTER — Encounter (HOSPITAL_COMMUNITY): Payer: Self-pay

## 2022-01-13 ENCOUNTER — Telehealth (HOSPITAL_COMMUNITY): Payer: Self-pay | Admitting: *Deleted

## 2022-01-13 NOTE — Telephone Encounter (Signed)
Preadmission screen  

## 2022-01-14 ENCOUNTER — Other Ambulatory Visit (HOSPITAL_COMMUNITY): Payer: Self-pay | Admitting: *Deleted

## 2022-01-14 DIAGNOSIS — O0990 Supervision of high risk pregnancy, unspecified, unspecified trimester: Secondary | ICD-10-CM

## 2022-01-14 DIAGNOSIS — Z22330 Carrier of Group B streptococcus: Secondary | ICD-10-CM

## 2022-01-16 ENCOUNTER — Other Ambulatory Visit: Payer: Self-pay

## 2022-01-19 ENCOUNTER — Inpatient Hospital Stay (HOSPITAL_COMMUNITY)
Admission: AD | Admit: 2022-01-19 | Discharge: 2022-01-21 | DRG: 807 | Disposition: A | Discharge status: Home / Self Care | Payer: Medicaid Other | Attending: Obstetrics & Gynecology | Admitting: Obstetrics & Gynecology

## 2022-01-19 ENCOUNTER — Inpatient Hospital Stay (HOSPITAL_COMMUNITY): Payer: Medicaid Other

## 2022-01-19 ENCOUNTER — Encounter (HOSPITAL_COMMUNITY): Payer: Self-pay | Admitting: Obstetrics and Gynecology

## 2022-01-19 ENCOUNTER — Other Ambulatory Visit: Payer: Self-pay

## 2022-01-19 DIAGNOSIS — A599 Trichomoniasis, unspecified: Secondary | ICD-10-CM | POA: Diagnosis present

## 2022-01-19 DIAGNOSIS — O99214 Obesity complicating childbirth: Secondary | ICD-10-CM | POA: Diagnosis present

## 2022-01-19 DIAGNOSIS — O0990 Supervision of high risk pregnancy, unspecified, unspecified trimester: Secondary | ICD-10-CM

## 2022-01-19 DIAGNOSIS — Z3A39 39 weeks gestation of pregnancy: Secondary | ICD-10-CM | POA: Diagnosis not present

## 2022-01-19 DIAGNOSIS — Z20822 Contact with and (suspected) exposure to covid-19: Secondary | ICD-10-CM | POA: Diagnosis present

## 2022-01-19 DIAGNOSIS — Z87891 Personal history of nicotine dependence: Secondary | ICD-10-CM | POA: Diagnosis not present

## 2022-01-19 DIAGNOSIS — O1002 Pre-existing essential hypertension complicating childbirth: Secondary | ICD-10-CM | POA: Diagnosis present

## 2022-01-19 DIAGNOSIS — O24420 Gestational diabetes mellitus in childbirth, diet controlled: Secondary | ICD-10-CM | POA: Diagnosis present

## 2022-01-19 DIAGNOSIS — Z6841 Body Mass Index (BMI) 40.0 and over, adult: Secondary | ICD-10-CM

## 2022-01-19 DIAGNOSIS — Z22330 Carrier of Group B streptococcus: Secondary | ICD-10-CM

## 2022-01-19 DIAGNOSIS — O2641 Herpes gestationis, first trimester: Secondary | ICD-10-CM | POA: Diagnosis present

## 2022-01-19 DIAGNOSIS — Z91040 Latex allergy status: Secondary | ICD-10-CM | POA: Diagnosis not present

## 2022-01-19 DIAGNOSIS — O1092 Unspecified pre-existing hypertension complicating childbirth: Secondary | ICD-10-CM | POA: Diagnosis not present

## 2022-01-19 DIAGNOSIS — O99824 Streptococcus B carrier state complicating childbirth: Secondary | ICD-10-CM | POA: Diagnosis present

## 2022-01-19 DIAGNOSIS — O24419 Gestational diabetes mellitus in pregnancy, unspecified control: Secondary | ICD-10-CM | POA: Diagnosis present

## 2022-01-19 DIAGNOSIS — O10919 Unspecified pre-existing hypertension complicating pregnancy, unspecified trimester: Secondary | ICD-10-CM | POA: Diagnosis present

## 2022-01-19 LAB — RESP PANEL BY RT-PCR (FLU A&B, COVID) ARPGX2
Influenza A by PCR: NEGATIVE
Influenza B by PCR: NEGATIVE
SARS Coronavirus 2 by RT PCR: NEGATIVE

## 2022-01-19 LAB — GLUCOSE, CAPILLARY
Glucose-Capillary: 85 mg/dL (ref 70–99)
Glucose-Capillary: 138 mg/dL — ABNORMAL HIGH (ref 70–99)

## 2022-01-19 LAB — RPR: RPR Ser Ql: NONREACTIVE

## 2022-01-19 LAB — CBC
HCT: 35.6 % — ABNORMAL LOW (ref 36.0–46.0)
Hemoglobin: 12.3 g/dL (ref 12.0–15.0)
MCH: 31 pg (ref 26.0–34.0)
MCHC: 34.6 g/dL (ref 30.0–36.0)
MCV: 89.7 fL (ref 80.0–100.0)
Platelets: 234 10*3/uL (ref 150–400)
RBC: 3.97 MIL/uL (ref 3.87–5.11)
RDW: 12.6 % (ref 11.5–15.5)
WBC: 8.1 10*3/uL (ref 4.0–10.5)
nRBC: 0 % (ref 0.0–0.2)

## 2022-01-19 LAB — COMPREHENSIVE METABOLIC PANEL
ALT: 14 U/L (ref 0–44)
AST: 15 U/L (ref 15–41)
Albumin: 2.8 g/dL — ABNORMAL LOW (ref 3.5–5.0)
Alkaline Phosphatase: 124 U/L (ref 38–126)
Anion gap: 10 (ref 5–15)
BUN: <5 mg/dL — ABNORMAL LOW (ref 6–20)
CO2: 22 mmol/L (ref 22–32)
Calcium: 9.3 mg/dL (ref 8.9–10.3)
Chloride: 105 mmol/L (ref 98–111)
Creatinine, Ser: 0.57 mg/dL (ref 0.44–1.00)
GFR, Estimated: >60 mL/min (ref >60)
Glucose, Bld: 114 mg/dL — ABNORMAL HIGH (ref 70–99)
Potassium: 3.2 mmol/L — ABNORMAL LOW (ref 3.5–5.1)
Sodium: 137 mmol/L (ref 135–145)
Total Bilirubin: 0.2 mg/dL — ABNORMAL LOW (ref 0.3–1.2)
Total Protein: 6.8 g/dL (ref 6.5–8.1)

## 2022-01-19 LAB — TYPE AND SCREEN
ABO/RH(D): O POS
Antibody Screen: NEGATIVE

## 2022-01-19 LAB — PROTEIN / CREATININE RATIO, URINE
Creatinine, Urine: 73.25 mg/dL
Protein Creatinine Ratio: 0.27 mg/mg{Cre} — ABNORMAL HIGH (ref 0.00–0.15)
Total Protein, Urine: 20 mg/dL

## 2022-01-19 LAB — SARS CORONAVIRUS 2 (TAT 6-24 HRS): SARS Coronavirus 2: NEGATIVE

## 2022-01-19 MED ORDER — SOD CITRATE-CITRIC ACID 500-334 MG/5ML PO SOLN: 30.0000 mL | ORAL | Status: DC | PRN

## 2022-01-19 MED ORDER — SIMETHICONE 80 MG PO CHEW: 80.0000 mg | CHEWABLE_TABLET | ORAL | Status: DC | PRN

## 2022-01-19 MED ORDER — BENZOCAINE-MENTHOL 20-0.5 % EX AERO: 1.0000 "application " | INHALATION_SPRAY | CUTANEOUS | Status: DC | PRN

## 2022-01-19 MED ORDER — COCONUT OIL OIL: 1.0000 "application " | TOPICAL_OIL | Status: DC | PRN

## 2022-01-19 MED ORDER — ONDANSETRON HCL 4 MG/2ML IJ SOLN: 4.0000 mg | INTRAMUSCULAR | Status: DC | PRN

## 2022-01-19 MED ORDER — MEDROXYPROGESTERONE ACETATE 150 MG/ML IM SUSP
150.0000 mg | INTRAMUSCULAR | Status: AC | PRN
  Administered 2022-01-21: 150 mg via INTRAMUSCULAR
  Filled 2022-01-19: qty 1

## 2022-01-19 MED ORDER — TERBUTALINE SULFATE 1 MG/ML IJ SOLN: 0.2500 mg | Freq: Once | INTRAMUSCULAR | Status: DC | PRN

## 2022-01-19 MED ORDER — LIDOCAINE HCL (PF) 1 % IJ SOLN: 30.0000 mL | INTRAMUSCULAR | Status: DC | PRN

## 2022-01-19 MED ORDER — TRANEXAMIC ACID-NACL 1000-0.7 MG/100ML-% IV SOLN
INTRAVENOUS | Status: AC
  Administered 2022-01-19: 1000 mg via INTRAVENOUS
  Filled 2022-01-19: qty 100

## 2022-01-19 MED ORDER — LACTATED RINGERS IV SOLN: INTRAVENOUS | Status: DC

## 2022-01-19 MED ORDER — DIPHENHYDRAMINE HCL 25 MG PO CAPS: 25.0000 mg | ORAL_CAPSULE | Freq: Four times a day (QID) | ORAL | Status: DC | PRN

## 2022-01-19 MED ORDER — PRENATAL MULTIVITAMIN CH
1.0000 | ORAL_TABLET | Freq: Every day | ORAL | Status: DC
  Filled 2022-01-19: qty 1

## 2022-01-19 MED ORDER — LACTATED RINGERS IV SOLN: 500.0000 mL | INTRAVENOUS | Status: DC | PRN

## 2022-01-19 MED ORDER — SENNOSIDES-DOCUSATE SODIUM 8.6-50 MG PO TABS
2.0000 | ORAL_TABLET | Freq: Every day | ORAL | Status: DC
  Filled 2022-01-19 (×2): qty 2

## 2022-01-19 MED ORDER — ONDANSETRON HCL 4 MG PO TABS: 4.0000 mg | ORAL_TABLET | ORAL | Status: DC | PRN

## 2022-01-19 MED ORDER — TRANEXAMIC ACID-NACL 1000-0.7 MG/100ML-% IV SOLN: 1000.0000 mg | Freq: Once | INTRAVENOUS | Status: AC

## 2022-01-19 MED ORDER — LABETALOL HCL 200 MG PO TABS
200.0000 mg | ORAL_TABLET | Freq: Two times a day (BID) | ORAL | Status: DC
  Administered 2022-01-19: 200 mg via ORAL
  Filled 2022-01-19: qty 1

## 2022-01-19 MED ORDER — VALACYCLOVIR HCL 500 MG PO TABS
500.0000 mg | ORAL_TABLET | Freq: Two times a day (BID) | ORAL | Status: DC
  Administered 2022-01-19: 500 mg via ORAL
  Filled 2022-01-19: qty 1

## 2022-01-19 MED ORDER — IBUPROFEN 600 MG PO TABS
600.0000 mg | ORAL_TABLET | Freq: Four times a day (QID) | ORAL | Status: DC
  Filled 2022-01-19 (×2): qty 1

## 2022-01-19 MED ORDER — WITCH HAZEL-GLYCERIN EX PADS: 1.0000 "application " | MEDICATED_PAD | CUTANEOUS | Status: DC | PRN

## 2022-01-19 MED ORDER — TETANUS-DIPHTH-ACELL PERTUSSIS 5-2.5-18.5 LF-MCG/0.5 IM SUSY: 0.5000 mL | PREFILLED_SYRINGE | Freq: Once | INTRAMUSCULAR | Status: DC

## 2022-01-19 MED ORDER — ONDANSETRON HCL 4 MG/2ML IJ SOLN: 4.0000 mg | Freq: Four times a day (QID) | INTRAMUSCULAR | Status: DC | PRN

## 2022-01-19 MED ORDER — ACETAMINOPHEN 325 MG PO TABS: 650.0000 mg | ORAL_TABLET | ORAL | Status: DC | PRN

## 2022-01-19 MED ORDER — OXYTOCIN-SODIUM CHLORIDE 30-0.9 UT/500ML-% IV SOLN: 2.5000 [IU]/h | INTRAVENOUS | Status: DC

## 2022-01-19 MED ORDER — OXYCODONE-ACETAMINOPHEN 5-325 MG PO TABS: 1.0000 | ORAL_TABLET | ORAL | Status: DC | PRN

## 2022-01-19 MED ORDER — OXYTOCIN BOLUS FROM INFUSION
333.0000 mL | Freq: Once | INTRAVENOUS | Status: AC
  Administered 2022-01-19: 333 mL via INTRAVENOUS

## 2022-01-19 MED ORDER — SODIUM CHLORIDE 0.9 % IV SOLN
5.0000 10*6.[IU] | Freq: Once | INTRAVENOUS | Status: AC
  Administered 2022-01-19: 5 10*6.[IU] via INTRAVENOUS
  Filled 2022-01-19: qty 5

## 2022-01-19 MED ORDER — NIFEDIPINE ER OSMOTIC RELEASE 30 MG PO TB24
30.0000 mg | ORAL_TABLET | Freq: Every day | ORAL | Status: DC
  Administered 2022-01-19 – 2022-01-21 (×3): 30 mg via ORAL
  Filled 2022-01-19 (×3): qty 1

## 2022-01-19 MED ORDER — DIBUCAINE (PERIANAL) 1 % EX OINT: 1.0000 "application " | TOPICAL_OINTMENT | CUTANEOUS | Status: DC | PRN

## 2022-01-19 MED ORDER — OXYTOCIN-SODIUM CHLORIDE 30-0.9 UT/500ML-% IV SOLN
1.0000 m[IU]/min | INTRAVENOUS | Status: DC
  Administered 2022-01-19: 2 m[IU]/min via INTRAVENOUS
  Filled 2022-01-19: qty 500

## 2022-01-19 MED ORDER — PENICILLIN G POT IN DEXTROSE 60000 UNIT/ML IV SOLN
3.0000 10*6.[IU] | INTRAVENOUS | Status: DC
  Administered 2022-01-19 (×2): 3 10*6.[IU] via INTRAVENOUS
  Filled 2022-01-19 (×2): qty 50

## 2022-01-19 MED ORDER — OXYCODONE-ACETAMINOPHEN 5-325 MG PO TABS: 2.0000 | ORAL_TABLET | ORAL | Status: DC | PRN

## 2022-01-19 NOTE — H&P (Signed)
OBSTETRIC ADMISSION HISTORY AND PHYSICAL  Tanya Moreno is a 31 y.o. female [phone] with IUP at [redacted]w[redacted]d by 8 wk [place] presenting for IOL due to chronic hypertension and A1GDM. She reports +FMs, no LOF, no VB, no blurry vision, headaches, peripheral edema, or RUQ pain.  She plans on formula feeding. She requests Depo for birth control postpartum.   She received her prenatal care at [facility].  Dating: By 8 week [place] --->  Estimated Date of Delivery: 01/26/22  Sono:   '@[redacted]w[redacted]d'$ , normal anatomy, cephalic presentation, posterior placental lie, 3563 g, 87% EFW  Prenatal History/Complications:  --Chronic hypertension on Labetalol 200 mg BID --A1GDM  --Positive trich 8/10, 10/4, and 1/16 >> treated with Flagyl 1/23  --Positive chlamydia 8/10 and 10/4, negative TOC on 1/16  --History of HSV, not on Valtrex, last outbreak 2-3 years ago, no current symptoms --Elevated BMI (53)   Past Medical History: Past Medical History:  Diagnosis Date   Chlamydia    Chlamydia infection affecting pregnancy in first trimester 02/09/2017   Positive on 02/08/17, azithromycin 1000 mg PO x 1 dose sent to pt pharmacy and pt notified by phone by [facility] staff Positive on 7/30   Genital HSV    Last outbreak Nov 2013   Gestational diabetes    Gonorrhea    Hypertension    Pregnancy induced hypertension    Trichomonas     Past Surgical History: Past Surgical History:  Procedure Laterality Date   NO PAST SURGERIES      Obstetrical History: OB History     Gravida  5   Para  4   Term  4   Preterm      AB      Living  4      SAB      IAB      Ectopic      Multiple  0   Live Births  4           Social History Social History   Socioeconomic History   Marital status: Single    Spouse name: Not on file   Number of children: Not on file   Years of education: Not on file   Highest education level: Not on file  Occupational History   Not on file  Tobacco Use   Smoking status: Former    Types:  Cigarettes    Quit date: 2015    Years since quitting: 8.1   Smokeless tobacco: Former    Quit date: 11/10/2014  Vaping Use   Vaping Use: Never used  Substance and Sexual Activity   Alcohol use: No    Alcohol/week: 0.0 standard drinks   Drug use: No   Sexual activity: Yes    Partners: Male    Birth control/protection: None  Other Topics Concern   Not on file  Social History Narrative   Lives with godmother.    Social Determinants of Health   Financial Resource Strain: Not on file  Food Insecurity: Not on file  Transportation Needs: Not on file  Physical Activity: Not on file  Stress: Not on file  Social Connections: Not on file    Family History: Family History  Problem Relation Age of Onset   Hypertension Maternal Grandmother     Allergies: Allergies  Allergen Reactions   Latex Swelling    Mainly latex condoms   Tape Itching    Facility-Administered Medications Prior to Admission  Medication Dose Route Frequency Provider Last Rate Last Admin  medroxyPROGESTERone (DEPO-PROVERA) injection 150 mg  150 mg Intramuscular Q90 days Woodroe Mode, MD   150 mg at 08/08/20 1423   Medications Prior to Admission  Medication Sig Dispense Refill Last Dose   Accu-Chek Softclix Lancets lancets Use 1 lancet to check blood sugar 4 times daily 100 each 5    Blood Glucose Monitoring Suppl (ACCU-CHEK GUIDE) w/Device KIT Use glucose meter to check blood sugar 4 timers daily 1 kit 0    calcium carbonate (TUMS - DOSED IN MG ELEMENTAL CALCIUM) 500 MG chewable tablet Chew 1 tablet by mouth daily.      glucose blood (ACCU-CHEK GUIDE) test strip Use 1 test strip to check blood sugar 4 times daily 100 each 5    labetalol (NORMODYNE) 200 MG tablet Take 1 tablet (200 mg total) by mouth 2 (two) times daily. 60 tablet 3    metroNIDAZOLE (FLAGYL) 500 MG tablet Take 1 tablet (500 mg total) by mouth 2 (two) times daily. (Patient not taking: Reported on 01/08/2022) 14 tablet 0    Misc. Devices  (GOJJI WEIGHT SCALE) MISC 1 Device by Does not apply route every 30 (thirty) days. 1 each 0    Prenatal Vit-Fe Phos-FA-Omega (VITAFOL GUMMIES) 3.33-0.333-34.8 MG CHEW Chew 3 tablets by mouth daily. 90 tablet 11      Review of Systems  All systems reviewed and negative except as stated in HPI  Blood pressure (!) 146/75, pulse 85, temperature 97.9 F (36.6 C), temperature source Oral, resp. rate 16, height $RemoveBe'5\' 4"'EcioPdMYp$  (1.626 m), weight (!) 140.9 kg, last menstrual period 03/19/2021.  General appearance: alert, cooperative, and no distress Lungs: normal work of breathing on room air  Heart: normal rate, warm and well perfused  Abdomen: soft, non-tender, gravid  Pelvic: normal external genitalia, normal vaginal mucosa, normal discharge present, no lesions noted  Extremities: no LE edema or calf tenderness to palpation   Presentation: Cephalic per RN Fetal monitoring: Baseline 140 bpm, moderate variability, + accels, no decels  Uterine activity: No contractions Dilation: 4.5 Effacement (%): 40 Station: -3 Exam by:: Davis,RN   Prenatal labs: ABO, Rh: --/--/O POS (02/13 0830) Antibody: NEG (02/13 0830) Rubella: 1.55 (08/10 1529) RPR: Non Reactive (12/20 1013)  HBsAg: Negative (08/10 1529)  HIV: Non Reactive (12/20 1013)  GBS: Positive/-- (01/26 1146)  2 hr Glucola - Abnormal  Genetic screening - Negative Horizon, not enough for NIPS Anatomy US normal   Prenatal Transfer Tool  Maternal Diabetes: Yes:  Diabetes Type:  Diet controlled Genetic Screening: Normal Maternal Ultrasounds/Referrals: Normal Fetal Ultrasounds or other Referrals:  None Maternal Substance Abuse:  No Significant Maternal Medications:  None Significant Maternal Lab Results: Group B Strep positive  Results for orders placed or performed during the hospital encounter of 01/19/22 (from the past 24 hour(s))  Type and screen   Collection Time: 01/19/22  8:30 AM  Result Value Ref Range   ABO/RH(D) O POS    Antibody  Screen NEG    Sample Expiration      01/22/2022,2359 Performed at [facility], [place] [address] [place], [place] [address]   CBC   Collection Time: 01/19/22  8:35 AM  Result Value Ref Range   WBC 8.1 4.0 - 10.5 K/uL   RBC 3.97 3.87 - 5.11 MIL/uL   Hemoglobin 12.3 12.0 - 15.0 g/dL   HCT 35.6 (L) 36.0 - 46.0 %   MCV 89.7 80.0 - 100.0 fL   MCH 31.0 26.0 - 34.0 pg   MCHC 34.6  30.0 - 36.0 g/dL   RDW 12.6 11.5 - 15.5 %   Platelets 234 150 - 400 K/uL   nRBC 0.0 0.0 - 0.2 %    Patient Active Problem List   Diagnosis Date Noted   GBS carrier 01/12/2022   Chronic hypertension during pregnancy 12/22/2021   Trichomoniasis    Gestational diabetes 12/03/2021   Supervision of high risk pregnancy, antepartum 06/19/2021   Chronic hypertension 07/22/2019   Carpal tunnel syndrome during pregnancy 08/24/2017   History of HSV 04/14/2017   BMI 50.0-59.9, adult ([place]) 04/14/2017   Latex allergy 08/26/2011    Assessment/Plan:  ENMA MAEDA is a 31 y.o. G5P4004 at [redacted]w[redacted]d here for IOL due to chronic hypertension and A1GDM.   #Labor: Will start Pitocin 2x2. Plan for AROM when appropriate.  #Pain: PRN #FWB: Cat 1  #ID:  GBS pos; PCN ordered  #MOF: Formula  #MOC: Depo   #cHTN: On Labetalol 200 mg BID. Baseline pre-E labs pending. No symptoms. Normal to mild range BP on admit. Will continue to monitor.   #A1GDM: Plan for q4hr glucose checks in latent labor, q2hr during active phase. EFW 87% at [redacted]w[redacted]d. Pelvis proven to 3980 g.   #Hx of genital HSV: No current symptoms. Last outbreak 2-3 years ago. SSE negative. Will start Valtrex prophylaxis inpatient.  #Trichomonas infection: Treated on 1/23. Will need TOC postpartum.   Genia Del, MD  01/19/2022, 9:39 AM

## 2022-01-19 NOTE — Plan of Care (Signed)
Jaxtyn Linville, RN 

## 2022-01-19 NOTE — Discharge Summary (Signed)
° °  Postpartum Discharge Summary ° °   °Patient Name: Tanya Moreno °DOB: 01/10/1991 °MRN: [id number] ° °Date of admission: 01/19/2022 °Delivery date:01/19/2022  °Delivering provider: ALBERT, CHRISTINA M  °Date of discharge: 01/21/2022 ° °Admitting diagnosis: Encounter for induction of labor [Z34.90] °Intrauterine pregnancy: [redacted]w[redacted]d     °Secondary diagnosis:  Principal Problem: °  Vaginal delivery °Active Problems: °  History of HSV °  BMI 50.0-59.9, adult ([facility]) °  Supervision of high risk pregnancy, antepartum °  Gestational diabetes °  Chronic hypertension during pregnancy °  Trichomoniasis °  GBS carrier ° °Additional problems: None     °Discharge diagnosis: Term Pregnancy Delivered                                              °Post partum procedures: None °Augmentation: AROM and Pitocin °Complications: None ° °Hospital course: Induction of Labor With Vaginal Delivery   °30 y.o. yo G5P5005 at [redacted]w[redacted]d was admitted to the hospital 01/19/2022 for induction of labor.  Indication for induction: cHTN and A1GDM.  Patient had an uncomplicated labor course and progressed to complete after Pitocin and AROM. She had an uncomplicated vaginal delivery. °Membrane Rupture Time/Date: 3:28 PM ,01/19/2022   °Delivery Method:Vaginal, Spontaneous  °Episiotomy: None  °Lacerations:  None  °Details of delivery can be found in separate delivery note.  Patient had a routine postpartum course.  Her blood pressures remained controlled on Procardia 30 mg postpartum without signs/symptoms of pre-eclampsia.  She will continue this upon discharge in addition to a 5 day course of Lasix. She was enrolled in [employer] and will have a blood pressure check in one week in the office. Her fasting CBG was 107 on PPD#2. Will plan for GTT at postpartum visit. She is otherwise eating, drinking, voiding, and ambulating without issue.  Patient is discharged home 01/21/22. ° °Newborn Data: °Birth date:01/19/2022  °Birth time:6:11 PM  °Gender:Female  °Living  status:Living  °Apgars:9 ,9  °Weight:3430 g  ° °Magnesium Sulfate received: No °BMZ received: No °Rhophylac: N/A °MMR: N/A °T-DaP: Offered postpartum  °Flu: No °Transfusion: No  ° °Physical exam: °Vitals:  ° 01/21/22 0512 01/21/22 0616 01/21/22 1003 01/21/22 1119  °BP: (!) 138/93 (!) 110/59 (!) 137/100 (!) 144/91  °Pulse: 62 74 72 68  °Resp: 18     °Temp: 98 °F (36.7 °C)     °TempSrc: Oral     °SpO2: 97%     °Weight:      °Height:      ° °General: alert, cooperative, and no distress °Lochia: appropriate °Uterine Fundus: firm and below umbilicus  °DVT Evaluation: 1+ lower extremity edema to knees bilaterally, no calf tenderness to palpation  ° °Labs: °Lab Results  °Component Value Date  ° WBC 8.1 01/19/2022  ° HGB 12.3 01/19/2022  ° HCT 35.6 (L) 01/19/2022  ° MCV 89.7 01/19/2022  ° PLT 234 01/19/2022  ° °CMP Latest Ref Rng & Units 01/19/2022  °Glucose 70 - 99 mg/dL 114(H)  °BUN 6 - 20 mg/dL <5(L)  °Creatinine 0.44 - 1.00 mg/dL 0.57  °Sodium 135 - 145 mmol/L 137  °Potassium 3.5 - 5.1 mmol/L 3.2(L)  °Chloride 98 - 111 mmol/L 105  °CO2 22 - 32 mmol/L 22  °Calcium 8.9 - 10.3 mg/dL 9.3  °Total Protein 6.5 - 8.1 g/dL 6.8  °Total Bilirubin 0.3 - 1.2 mg/dL 0.2(L)  °Alkaline Phos   38 - 126 U/L 124  °AST 15 - 41 U/L 15  °ALT 0 - 44 U/L 14  ° °Edinburgh Score: °Edinburgh Postnatal Depression Scale Screening Tool 08/28/2019  °I have been able to laugh and see the funny side of things. 1  °I have looked forward with enjoyment to things. 2  °I have blamed myself unnecessarily when things went wrong. 0  °I have been anxious or worried for no good reason. 0  °I have felt scared or panicky for no good reason. 0  °Things have been getting on top of me. 1  °I have been so unhappy that I have had difficulty sleeping. 2  °I have felt sad or miserable. 0  °I have been so unhappy that I have been crying. 0  °The thought of harming myself has occurred to me. 0  °Edinburgh Postnatal Depression Scale Total 6  ° ° ° °After visit meds:  °Allergies  as of 01/21/2022   ° °   Reactions  ° Latex Swelling, Other (See Comments)  ° Mainly latex condoms  ° Tape Itching  ° °  ° °  °Medication List  °  ° °STOP taking these medications   ° °Accu-Chek Guide test strip °Generic drug: glucose blood °  °Accu-Chek Guide w/Device Kit °  °Accu-Chek Softclix Lancets lancets °  °calcium carbonate 500 MG chewable tablet °Commonly known as: TUMS - dosed in mg elemental calcium °  °Gojji Weight Scale Misc °  °labetalol 200 MG tablet °Commonly known as: NORMODYNE °  °metroNIDAZOLE 500 MG tablet °Commonly known as: FLAGYL °  ° °  ° °TAKE these medications   ° °acetaminophen 500 MG tablet °Commonly known as: TYLENOL °Take 2 tablets (1,000 mg total) by mouth every 8 (eight) hours as needed (pain). °  °furosemide 20 MG tablet °Commonly known as: Lasix °Take 1 tablet (20 mg total) by mouth daily for 5 days. °  °ibuprofen 100 MG/5ML suspension °Commonly known as: ADVIL °Take 30 mLs (600 mg total) by mouth every 6 (six) hours as needed for up to 5 days. °  °NIFEdipine 30 MG 24 hr tablet °Commonly known as: ADALAT CC °Take 1 tablet (30 mg total) by mouth daily. °  °Vitafol Gummies 3.33-0.333-34.8 MG Chew °Chew 3 tablets by mouth daily. °  ° °  ° ° ° °Discharge home in stable condition °Infant Feeding: Bottle °Infant Disposition: home with mother °Discharge instruction: per After Visit Summary and Postpartum booklet. °Activity: Advance as tolerated. Pelvic rest for 6 weeks.  °Diet: routine diet °Future Appointments: °Future Appointments  °Date Time Provider Department Center  °01/26/2022  1:20 PM [facility] NURSE [facility] None  °03/03/2022  9:00 AM [facility] LAB [facility] None  °03/03/2022  9:15 AM Arnold, James G, MD [facility] None  ° °Follow up Visit: °Message sent to [facility] by Dr. Albert on 01/19/22.  ° °Please schedule this patient for a In person postpartum visit in 6 weeks with the following provider: Any provider. °Additional Postpartum F/U: 2 hour GTT and BP check 1 week  °High risk pregnancy  complicated by: GDM and HTN °Delivery mode:  Vaginal, Spontaneous  °Anticipated Birth Control:  Depo ° °01/21/2022 °Christina M Albert, MD ° ° ° °

## 2022-01-19 NOTE — Progress Notes (Signed)
Labor Progress Note Tanya Moreno is a 31 y.o. G5P4004 at [redacted]w[redacted]d who presented for IOL due to cHTN and A1GDM.  S: Doing well. Feeling her contractions more. Rating them about 7/10. No concerns at this time.  O:  BP (!) 147/90    Pulse 85    Temp 98.2 F (36.8 C) (Oral)    Resp 16    Ht 5\' 4"  (1.626 m)    Wt (!) 140.9 kg    LMP 03/19/2021    BMI 53.31 kg/m   EFM: Baseline 145 bpm, moderate variability, + accels, no decels  Toco: Every 1-4 minutes  CVE: Dilation: 4.5 Effacement (%): 50 Station: -3 Presentation: Vertex Exam by:: Dr. [email or web address]   A&P: 31 y.o. 38 [redacted]w[redacted]d   #Labor: SVE unchanged since last exam. Fetal head well applied. AROM performed with clear fluid. Will continue Pitocin and reassess in 4 hours, sooner as needed.  #Pain: PRN, maternal support at this time  #FWB: Cat 1  #GBS positive; PCN  #A1GDM: CBGs at goal. Will continue q4hr glucose checks.   #cHTN: On home Labetalol 200 mg BID. BP normal to mild range. Pre-E labs normal. No symptoms. Will continue to monitor.   [redacted]w[redacted]d, MD 4:05 PM

## 2022-01-20 LAB — GLUCOSE, CAPILLARY: Glucose-Capillary: 128 mg/dL — ABNORMAL HIGH (ref 70–99)

## 2022-01-20 MED ORDER — IBUPROFEN 100 MG/5ML PO SUSP
600.0000 mg | Freq: Four times a day (QID) | ORAL | Status: DC
  Administered 2022-01-20: 600 mg via ORAL
  Administered 2022-01-21: 200 mg via ORAL
  Filled 2022-01-20 (×4): qty 30

## 2022-01-20 NOTE — Progress Notes (Signed)
Post Partum Day 1 Subjective: Doing well this morning, no complaints, up ad lib, voiding, tolerating PO, and + flatus. Reports minimal bleeding. Declines Tdap.   Objective: Blood pressure 132/80, pulse 69, temperature 97.6 F (36.4 C), temperature source Oral, resp. rate 18, height 5\' 4"  (1.626 m), weight (!) 140.9 kg, last menstrual period 03/19/2021, SpO2 100 %, unknown if currently breastfeeding.  Physical Exam:  General: alert, cooperative, and no distress Lochia: appropriate Uterine Fundus: firm DVT Evaluation: No evidence of DVT seen on physical exam. No significant calf/ankle edema.  Recent Labs    01/19/22 0835  HGB 12.3  HCT 35.6*    Assessment/Plan: G5 now P5 PPD#1. Bottle feeding. Plan for likely discharge tomorrow pending improved BP control. Contraception: Depo-provera prior to discharge.   #cHTN Started Procardia XL 30 mg postpartum. Systolic pressures have remained elevated overnight in the 150s-160s, improving this morning. Will continue to monitor and consider increasing Procardia to 60 mg.   LOS: 1 day   01/21/22, DO res  01/20/2022, 7:14 AM

## 2022-01-21 ENCOUNTER — Other Ambulatory Visit (HOSPITAL_COMMUNITY): Payer: Self-pay

## 2022-01-21 LAB — GLUCOSE, CAPILLARY: Glucose-Capillary: 107 mg/dL — ABNORMAL HIGH (ref 70–99)

## 2022-01-21 MED ORDER — FUROSEMIDE 20 MG PO TABS
20.0000 mg | ORAL_TABLET | Freq: Every day | ORAL | 0 refills | Status: DC
  Filled 2022-01-21: qty 5, 5d supply, fill #0

## 2022-01-21 MED ORDER — NIFEDIPINE ER 30 MG PO TB24
30.0000 mg | ORAL_TABLET | Freq: Every day | ORAL | 0 refills | Status: DC
  Filled 2022-01-21: qty 30, 30d supply, fill #0

## 2022-01-21 MED ORDER — ACETAMINOPHEN 500 MG PO TABS
1000.0000 mg | ORAL_TABLET | Freq: Three times a day (TID) | ORAL | 0 refills | Status: DC | PRN
  Filled 2022-01-21: qty 60, 10d supply, fill #0

## 2022-01-21 MED ORDER — IBUPROFEN 100 MG/5ML PO SUSP
600.0000 mg | Freq: Four times a day (QID) | ORAL | 0 refills | Status: AC | PRN
  Filled 2022-01-21: qty 120, 1d supply, fill #0

## 2022-01-21 NOTE — Social Work (Signed)
CSW provided MOB car seat for safe discharge.   Manfred Arch, LCSWA Clinical Social Work [employer] and [employer] [phone]

## 2022-01-21 NOTE — Progress Notes (Addendum)
°   01/21/22 1150  AVS Discharge Documentation  AVS Discharge Instructions Including Medications Provided to patient/caregiver  Name of Person Receiving AVS Discharge Instructions Including Medications Tanya Moreno  Name of Clinician That Reviewed AVS Discharge Instructions Including Medications Makenzi Bannister   Called from Dr. Mathis Fare that after review, discharge is ready for this patient. D/c completed, Babyscripts has been initiated with patient for postpartum. Questions and concerns answered.

## 2022-01-22 ENCOUNTER — Encounter: Payer: Self-pay | Admitting: Family Medicine

## 2022-01-26 ENCOUNTER — Other Ambulatory Visit: Payer: Self-pay

## 2022-01-26 ENCOUNTER — Ambulatory Visit (INDEPENDENT_AMBULATORY_CARE_PROVIDER_SITE_OTHER): Payer: Medicaid Other

## 2022-01-26 DIAGNOSIS — I10 Essential (primary) hypertension: Secondary | ICD-10-CM

## 2022-01-26 DIAGNOSIS — Z0130 Encounter for examination of blood pressure without abnormal findings: Secondary | ICD-10-CM

## 2022-01-26 NOTE — Progress Notes (Signed)
Subjective:  Tanya Moreno is a 31 y.o. female here for BP check.   Hypertension ROS: taking medications as instructed, no medication side effects noted, no TIA's, no chest pain on exertion, no dyspnea on exertion, and no swelling of ankles.    Objective:  BP (!) 144/94 (BP Location: Left Arm, Cuff Size: Large)    Pulse 66    Ht 5\' 4"  (1.626 m)    Wt 300 lb (136.1 kg)    LMP 03/19/2021    Breastfeeding No    BMI 51.49 kg/m   Appearance alert, well appearing, and in no distress and oriented to person, place, and time. General exam BP noted to be well controlled today in office.    Assessment:   Blood Pressure needs improvement.   Plan:  Per Dr. 03/21/2021, patient is start taking the Nifedipine twice daily for a total of 60 mg.  Patient verbalized understanding and agreement . RTO in 1 week for Blood Pressure Check or call Office/go to [facility] if you develop sx of Preeclampsia

## 2022-02-02 ENCOUNTER — Ambulatory Visit: Payer: Medicaid Other

## 2022-03-03 ENCOUNTER — Ambulatory Visit: Payer: Medicaid Other | Admitting: Obstetrics & Gynecology

## 2022-03-03 ENCOUNTER — Other Ambulatory Visit: Payer: Medicaid Other

## 2022-03-07 ENCOUNTER — Ambulatory Visit (HOSPITAL_COMMUNITY)
Admission: EM | Admit: 2022-03-07 | Discharge: 2022-03-07 | Disposition: A | Discharge status: Home / Self Care | Payer: Medicaid Other | Attending: Emergency Medicine | Admitting: Emergency Medicine

## 2022-03-07 ENCOUNTER — Ambulatory Visit (INDEPENDENT_AMBULATORY_CARE_PROVIDER_SITE_OTHER): Payer: Medicaid Other

## 2022-03-07 ENCOUNTER — Other Ambulatory Visit: Payer: Self-pay

## 2022-03-07 ENCOUNTER — Encounter (HOSPITAL_COMMUNITY): Payer: Self-pay | Admitting: *Deleted

## 2022-03-07 DIAGNOSIS — M545 Low back pain, unspecified: Secondary | ICD-10-CM

## 2022-03-07 DIAGNOSIS — M79605 Pain in left leg: Secondary | ICD-10-CM | POA: Diagnosis not present

## 2022-03-07 DIAGNOSIS — M5432 Sciatica, left side: Secondary | ICD-10-CM

## 2022-03-07 MED ORDER — BACLOFEN 10 MG PO TABS: 10.0000 mg | ORAL_TABLET | Freq: Every day | ORAL | 0 refills | Status: AC

## 2022-03-07 MED ORDER — KETOROLAC TROMETHAMINE 60 MG/2ML IM SOLN
60.0000 mg | Freq: Once | INTRAMUSCULAR | Status: AC
  Administered 2022-03-07: 60 mg via INTRAMUSCULAR

## 2022-03-07 MED ORDER — METHYLPREDNISOLONE 4 MG PO TABS: ORAL_TABLET | ORAL | 0 refills | Status: AC

## 2022-03-07 MED ORDER — DICLOFENAC SODIUM 1 % EX GEL: 4.0000 g | Freq: Four times a day (QID) | CUTANEOUS | 1 refills | Status: DC

## 2022-03-07 MED ORDER — KETOROLAC TROMETHAMINE 60 MG/2ML IM SOLN
INTRAMUSCULAR | Status: AC
  Filled 2022-03-07: qty 2

## 2022-03-07 NOTE — ED Provider Notes (Signed)
?[facility] ? ? ? ?CSN: [id number] ?Arrival date & time: 03/07/22  1248 ?  ? ?HISTORY  ? ?Chief Complaint  ?Patient presents with  ? Back Pain  ? ?HPI ?Tanya Moreno is a 31 y.o. female. Patient complains of left-sided lower back pain that is radiating down her left leg.  Patient states she has had this pain since her pregnancy, her fifth.  Patient states she delivered a month ago that the pain is gotten progressively worse.  On arrival today, patient is carrying her infant in a child carrier, her partner is outside in the car waiting on her.  Patient states she is never had an x-ray of her lower back.  Patient states her pain is worse with sitting and standing, some with lying down depending on the position she is in.  Patient states the only comfortable position she can get it right now is to lean forward over the table, bending at the waist. ? ?The history is provided by the patient.  ?Past Medical History:  ?Diagnosis Date  ? Chlamydia   ? Chlamydia infection affecting pregnancy in first trimester 02/09/2017  ? Positive on 02/08/17, azithromycin 1000 mg PO x 1 dose sent to pt pharmacy and pt notified by phone by [facility]U staff Positive on 7/30  ? Genital HSV   ? Last outbreak Nov 2013  ? Gestational diabetes   ? Gonorrhea   ? Hypertension   ? Pregnancy induced hypertension   ? Trichomonas   ? ?Patient Active Problem List  ? Diagnosis Date Noted  ? Vaginal delivery 01/19/2022  ? GBS carrier 01/12/2022  ? Chronic hypertension during pregnancy 12/22/2021  ? Trichomoniasis   ? Gestational diabetes 12/03/2021  ? Supervision of high risk pregnancy, antepartum 06/19/2021  ? Chronic hypertension 07/22/2019  ? Carpal tunnel syndrome during pregnancy 08/24/2017  ? History of HSV 04/14/2017  ? BMI 50.0-59.9, adult (HCC) 04/14/2017  ? Latex allergy 08/26/2011  ? ?Past Surgical History:  ?Procedure Laterality Date  ? NO PAST SURGERIES    ? ?OB History   ? ? Gravida  ?5  ? Para  ?5  ? Term  ?5  ? Preterm  ?   ? AB  ?    ? Living  ?5  ?  ? ? SAB  ?   ? IAB  ?   ? Ectopic  ?   ? Multiple  ?0  ? Live Births  ?5  ?   ?  ?  ? ?Home Medications   ? ?Prior to Admission medications   ?Medication Sig Start Date End Date Taking? Authorizing Provider  ?acetaminophen (TYLENOL) 500 MG tablet Take 2 tablets (1,000 mg total) by mouth every 8 (eight) hours as needed (pain). 01/21/22   Worthy RancherAlbert, Christina M, MD  ?furosemide (LASIX) 20 MG tablet Take 1 tablet (20 mg total) by mouth daily for 5 days. 01/21/22 01/26/22  Worthy RancherAlbert, Christina M, MD  ?NIFEdipine (ADALAT CC) 30 MG 24 hr tablet Take 1 tablet (30 mg total) by mouth daily. 01/21/22   Worthy RancherAlbert, Christina M, MD  ?Prenatal Vit-Fe Phos-FA-Omega (VITAFOL GUMMIES) 3.33-0.333-34.8 MG CHEW Chew 3 tablets by mouth daily. 06/19/21   Hermina StaggersErvin, Michael L, MD  ? ? ?Family History ?Family History  ?Problem Relation Age of Onset  ? Hypertension Maternal Grandmother   ? ?Social History ?Social History  ? ?Tobacco Use  ? Smoking status: Former  ?  Types: Cigarettes  ?  Quit date: 2015  ?  Years since quitting:  8.2  ? Smokeless tobacco: Former  ?  Quit date: 11/10/2014  ?Vaping Use  ? Vaping Use: Never used  ?Substance Use Topics  ? Alcohol use: No  ?  Alcohol/week: 0.0 standard drinks  ? Drug use: No  ? ?Allergies   ?Latex and Tape ? ?Review of Systems ?Review of Systems ?Pertinent findings noted in history of present illness.  ? ?Physical Exam ?Triage Vital Signs ?ED Triage Vitals  ?Enc Vitals Group  ?   BP 10/03/21 0827 (!) 147/82  ?   Pulse Rate 10/03/21 0827 72  ?   Resp 10/03/21 0827 18  ?   Temp 10/03/21 0827 98.3 ?F (36.8 ?C)  ?   Temp Source 10/03/21 0827 Oral  ?   SpO2 10/03/21 0827 98 %  ?   Weight --   ?   Height --   ?   Head Circumference --   ?   Peak Flow --   ?   Pain Score 10/03/21 0826 5  ?   Pain Loc --   ?   Pain Edu? --   ?   Excl. in GC? --   ?No data found. ? ?Updated Vital Signs ?BP (!) 165/105   Pulse 65   Temp 98.3 ?F (36.8 ?C)   Resp 18   LMP 03/19/2021   SpO2 98%   Breastfeeding No   ? ?Physical Exam ?Vitals and nursing note reviewed.  ?Constitutional:   ?   General: She is not in acute distress. ?   Appearance: Normal appearance. She is not ill-appearing.  ?HENT:  ?   Head: Normocephalic and atraumatic.  ?Eyes:  ?   General: Lids are normal.     ?   Right eye: No discharge.     ?   Left eye: No discharge.  ?   Extraocular Movements: Extraocular movements intact.  ?   Conjunctiva/sclera: Conjunctivae normal.  ?   Right eye: Right conjunctiva is not injected.  ?   Left eye: Left conjunctiva is not injected.  ?Neck:  ?   Trachea: Trachea and phonation normal.  ?Cardiovascular:  ?   Rate and Rhythm: Normal rate and regular rhythm.  ?   Pulses: Normal pulses.  ?   Heart sounds: Normal heart sounds. No murmur heard. ?  No friction rub. No gallop.  ?Pulmonary:  ?   Effort: Pulmonary effort is normal. No accessory muscle usage, prolonged expiration or respiratory distress.  ?   Breath sounds: Normal breath sounds. No stridor, decreased air movement or transmitted upper airway sounds. No decreased breath sounds, wheezing, rhonchi or rales.  ?Chest:  ?   Chest wall: No tenderness.  ?Musculoskeletal:     ?   General: Tenderness (Left lumbar paraspinous and SI joint muscles and tendons are tender to palpation with significant spasm.) present. Normal range of motion.  ?   Cervical back: Normal range of motion and neck supple. Normal range of motion.  ?Lymphadenopathy:  ?   Cervical: No cervical adenopathy.  ?Skin: ?   General: Skin is warm and dry.  ?   Findings: No erythema or rash.  ?Neurological:  ?   General: No focal deficit present.  ?   Mental Status: She is alert and oriented to person, place, and time.  ?Psychiatric:     ?   Mood and Affect: Mood normal.     ?   Behavior: Behavior normal.  ? ? ?Visual Acuity ?Right Eye Distance:   ?Left Eye  Distance:   ?Bilateral Distance:   ? ?Right Eye Near:   ?Left Eye Near:    ?Bilateral Near:    ? ?UC Couse / Diagnostics / Procedures:  ?   ?EKG ? ?Radiology ?DG Lumbar Spine Complete ? ?Result Date: 03/07/2022 ?CLINICAL DATA:  Left lower back pain, left-sided sciatica. EXAM: LUMBAR SPINE - COMPLETE 4+ VIEW COMPARISON:  None. FINDINGS: There is no evidence for lumbar spine fracture. Alignment is anatomic. There is mild disc space narrowing at L4-L5. Disc spaces are otherwise well maintained. Soft tissues are within normal limits. IMPRESSION: 1. Mild degenerative disc space narrowing at L4-L5. Electronically Signed   By: Darliss Cheney M.D.   On: 03/07/2022 15:43   ? ?Procedures ?Procedures (including critical care time) ? ?UC Diagnoses / Final Clinical Impressions(s)   ?I have reviewed the triage vital signs and the nursing notes. ? ?Pertinent labs & imaging results that were available during my care of the patient were reviewed by me and considered in my medical decision making (see chart for details).   ? ?Final diagnoses:  ?Low back pain radiating to left lower extremity  ? ?Patient has some mild disc narrowing at L4 and L5 with good alignment.  X-ray images reviewed with patient.  Patient was provided with an injection of ketorolac and advised to begin a tapering dose of methylprednisolone along with a muscle relaxer at bedtime.  Patient further advised that I prescribed her with a prescription of diclofenac gel that she can also take muscle relaxer at during the daytime, at half dose if needed secondary to somnolence, for best results.  Also recommend ice to lower back as able.  Patient advised to carry infant and not the child carrier.  Patient escorted by provider to her car, provider carried child  carrier on behalf of the patient.  At the vehicle, father of the child was advised of the same.  Return precautions advised.  Patient was advised that she could return in 2 days for a second ketorolac injection as needed.  Patient verbalized understanding. ? ? ?ED Prescriptions   ? ? Medication Sig Dispense Auth. Provider  ? methylPREDNISolone (MEDROL)  4 MG tablet Take 8 tablets (32 mg total) by mouth daily for 1 day, THEN 7 tablets (28 mg total) daily for 1 day, THEN 6 tablets (24 mg total) daily for 1 day, THEN 5 tablets (20 mg total) daily for 1 day, THEN 4 tablets (16 mg total) d

## 2022-03-07 NOTE — Discharge Instructions (Signed)
The x-ray of your lumbar spine today showed some degenerative disc disease between L4 and L5, the fourth and fifth vertebrae in your lumbar spine.  This disc degeneration comes with age but it also comes with pregnancy and usually improves after delivery.   ? ?It is impressive that you really have not had trouble with this until your fifth child and, while I am sure you are not feeling very lucky right now because you are in pain, it does speak volumes about how healthy your back is otherwise. ? ?The mainstay of therapy for lower back pain is reduction of inflammation and relaxation of tension which is causing inflammation.  Keep in mind, pain always begets more pain. ?  ?During your visit today, you received an injection of ketorolac, high-dose nonsteroidal anti-inflammatory pain medication that should significantly reduce your pain for the next 6 to 8 hours. ?  ?This evening, please begin taking baclofen 10 mg.  This is a highly effective muscle relaxer and antispasmodic which should continue to provide you with relaxation of your tense muscles, allow you to sleep well and to keep your pain under control.  You can take baclofen 3 times a day as needed but keep in mind that it can make you very sleepy.   ? ?Tomorrow morning, please begin taking methylprednisolone.  Please take all tablets of the daily recommended dose with your breakfast meal.  If you have had significant relation of your pain before you finish the entire prescription, please feel free to discontinue.  It is not important to finish every dose. ?  ?During the day, please make appoint to apply ice to the affected area 4 times daily for 20 minutes each application.  This can be achieved by using a bag of frozen peas or corn, a Ziploc bag filled with ice and water, or Ziploc bag filled with half rubbing alcohol and half Dawn dish detergent, frozen into a slush.  Please be careful not to apply ice directly to your skin, always place a soft cloth between  you and the ice pack. ?   ?You are welcome to use topical anti-inflammatory creams such as Voltaren gel, capsaicin or Aspercreme as recommended.  These medications are available over-the-counter, please follow manufactures instructions for use.  I provided you with a prescription for Voltaren. ?  ?Please consider discussing referral to physical therapy with your primary care provider.  Physical therapist are very good at teasing out the underlying cause of acute lower back pain and helping with prevention of future recurrences. ?  ?Please avoid attempts to stretch or strengthen the affected area until you are feeling completely pain-free.  Attempts to do so will only prolong the healing process. ?  ?If you would like to try to return return to urgent care in the next 2 to 3 days for repeat ketorolac injection, you are welcome to do so. ? ? ? ?

## 2022-03-07 NOTE — ED Triage Notes (Signed)
Pt reports lower back pain on Lt side that radiates down Lt leg. She has had this pain since pregnancy. ?

## 2022-03-10 ENCOUNTER — Encounter: Payer: Self-pay | Admitting: Obstetrics

## 2022-03-10 ENCOUNTER — Ambulatory Visit (INDEPENDENT_AMBULATORY_CARE_PROVIDER_SITE_OTHER): Payer: Medicaid Other | Admitting: Obstetrics

## 2022-03-10 NOTE — Progress Notes (Signed)
? ?  Patient did not answer phone. ? ?Brock Bad, MD ?03/11/2022 1:29 PM  ?

## 2022-03-11 ENCOUNTER — Other Ambulatory Visit: Payer: Medicaid Other

## 2022-03-19 ENCOUNTER — Other Ambulatory Visit: Payer: Medicaid Other

## 2022-03-24 ENCOUNTER — Other Ambulatory Visit: Payer: Medicaid Other

## 2022-03-27 ENCOUNTER — Other Ambulatory Visit: Payer: Medicaid Other

## 2022-04-01 ENCOUNTER — Other Ambulatory Visit: Payer: Medicaid Other

## 2022-04-05 ENCOUNTER — Ambulatory Visit (HOSPITAL_COMMUNITY)
Admission: EM | Admit: 2022-04-05 | Discharge: 2022-04-05 | Disposition: A | Discharge status: Home / Self Care | Payer: Medicaid Other | Attending: Family Medicine | Admitting: Family Medicine

## 2022-04-05 ENCOUNTER — Other Ambulatory Visit: Payer: Self-pay

## 2022-04-05 ENCOUNTER — Encounter (HOSPITAL_COMMUNITY): Payer: Self-pay | Admitting: *Deleted

## 2022-04-05 DIAGNOSIS — J069 Acute upper respiratory infection, unspecified: Secondary | ICD-10-CM | POA: Diagnosis not present

## 2022-04-05 DIAGNOSIS — J029 Acute pharyngitis, unspecified: Secondary | ICD-10-CM | POA: Diagnosis not present

## 2022-04-05 DIAGNOSIS — Z20822 Contact with and (suspected) exposure to covid-19: Secondary | ICD-10-CM | POA: Insufficient documentation

## 2022-04-05 DIAGNOSIS — B349 Viral infection, unspecified: Secondary | ICD-10-CM | POA: Insufficient documentation

## 2022-04-05 LAB — POC INFLUENZA A AND B ANTIGEN (URGENT CARE ONLY)
INFLUENZA A ANTIGEN, POC: NEGATIVE
INFLUENZA B ANTIGEN, POC: NEGATIVE

## 2022-04-05 LAB — POCT RAPID STREP A, ED / UC: Streptococcus, Group A Screen (Direct): NEGATIVE

## 2022-04-05 MED ORDER — PROMETHAZINE-DM 6.25-15 MG/5ML PO SYRP
5.0000 mL | ORAL_SOLUTION | Freq: Four times a day (QID) | ORAL | 0 refills | Status: DC | PRN
Start: 2022-04-05 — End: 2022-11-17

## 2022-04-05 NOTE — ED Provider Notes (Signed)
?[facility] ? ? ? ?CSN: [id number] ?Arrival date & time: 04/05/22  1013 ? ? ?  ? ?History   ?Chief Complaint ?Chief Complaint  ?Patient presents with  ? Sore Throat  ? Headache  ? Generalized Body Aches  ? ? ?HPI ?Tanya Moreno is a 31 y.o. female.  ? ?Patient is here for uri symptoms x 2 days.  ?Cough, sore throat, body aches, weakness.  No runny nose, congestion.  No fevers per se.  ?No wheezing or sob.  ?Mild nausea, no vomiting.  Able to drink okay.  ?She has used motrin.  ? ? ?Past Medical History:  ?Diagnosis Date  ? Chlamydia   ? Chlamydia infection affecting pregnancy in first trimester 02/09/2017  ? Positive on 02/08/17, azithromycin 1000 mg PO x 1 dose sent to pt pharmacy and pt notified by phone by [facility] staff Positive on 7/30  ? Genital HSV   ? Last outbreak Nov 2013  ? Gestational diabetes   ? Gonorrhea   ? Hypertension   ? Pregnancy induced hypertension   ? Trichomonas   ? ? ?Patient Active Problem List  ? Diagnosis Date Noted  ? Vaginal delivery 01/19/2022  ? GBS carrier 01/12/2022  ? Chronic hypertension during pregnancy 12/22/2021  ? Trichomoniasis   ? Gestational diabetes 12/03/2021  ? Supervision of high risk pregnancy, antepartum 06/19/2021  ? Chronic hypertension 07/22/2019  ? Carpal tunnel syndrome during pregnancy 08/24/2017  ? History of HSV 04/14/2017  ? BMI 50.0-59.9, adult (HCC) 04/14/2017  ? Latex allergy 08/26/2011  ? ? ?Past Surgical History:  ?Procedure Laterality Date  ? NO PAST SURGERIES    ? ? ?OB History   ? ? Gravida  ?5  ? Para  ?5  ? Term  ?5  ? Preterm  ?   ? AB  ?   ? Living  ?5  ?  ? ? SAB  ?   ? IAB  ?   ? Ectopic  ?   ? Multiple  ?0  ? Live Births  ?5  ?   ?  ?  ? ? ? ?Home Medications   ? ?Prior to Admission medications   ?Medication Sig Start Date End Date Taking? Authorizing Provider  ?acetaminophen (TYLENOL) 500 MG tablet Take 2 tablets (1,000 mg total) by mouth every 8 (eight) hours as needed (pain). 01/21/22   Worthy Rancher, MD  ?diclofenac Sodium  (VOLTAREN) 1 % GEL Apply 4 g topically 4 (four) times daily. 03/07/22   Theadora Rama Scales, PA-C  ?furosemide (LASIX) 20 MG tablet Take 1 tablet (20 mg total) by mouth daily for 5 days. 01/21/22 01/26/22  Worthy Rancher, MD  ?NIFEdipine (ADALAT CC) 30 MG 24 hr tablet Take 1 tablet (30 mg total) by mouth daily. 01/21/22   Worthy Rancher, MD  ?Prenatal Vit-Fe Phos-FA-Omega (VITAFOL GUMMIES) 3.33-0.333-34.8 MG CHEW Chew 3 tablets by mouth daily. 06/19/21   Hermina Staggers, MD  ? ? ?Family History ?Family History  ?Problem Relation Age of Onset  ? Hypertension Maternal Grandmother   ? ? ?Social History ?Social History  ? ?Tobacco Use  ? Smoking status: Former  ?  Types: Cigarettes  ?  Quit date: 2015  ?  Years since quitting: 8.3  ? Smokeless tobacco: Former  ?  Quit date: 11/10/2014  ?Vaping Use  ? Vaping Use: Never used  ?Substance Use Topics  ? Alcohol use: No  ?  Alcohol/week: 0.0 standard drinks  ? Drug use:  No  ? ? ? ?Allergies   ?Latex and Tape ? ? ?Review of Systems ?Review of Systems  ?Constitutional:  Positive for appetite change and fatigue. Negative for chills and fever.  ?HENT:  Positive for sore throat. Negative for congestion and rhinorrhea.   ?Respiratory: Negative.    ?Cardiovascular: Negative.   ?Gastrointestinal:  Positive for nausea.  ?Genitourinary: Negative.   ?Musculoskeletal:  Positive for arthralgias and myalgias.  ?Neurological:  Positive for headaches.  ?Psychiatric/Behavioral: Negative.    ? ? ?Physical Exam ?Triage Vital Signs ?ED Triage Vitals  ?Enc Vitals Group  ?   BP 04/05/22 1151 (!) 145/87  ?   Pulse Rate 04/05/22 1151 96  ?   Resp 04/05/22 1151 18  ?   Temp 04/05/22 1151 98.8 ?F (37.1 ?C)  ?   Temp src --   ?   SpO2 04/05/22 1151 98 %  ?   Weight --   ?   Height --   ?   Head Circumference --   ?   Peak Flow --   ?   Pain Score 04/05/22 1149 9  ?   Pain Loc --   ?   Pain Edu? --   ?   Excl. in GC? --   ? ?No data found. ? ?Updated Vital Signs ?BP (!) 145/87   Pulse 96   Temp  98.8 ?F (37.1 ?C)   Resp 18   LMP 02/04/2021   SpO2 98%  ? ?Visual Acuity ?Right Eye Distance:   ?Left Eye Distance:   ?Bilateral Distance:   ? ?Right Eye Near:   ?Left Eye Near:    ?Bilateral Near:    ? ?Physical Exam ?Constitutional:   ?   Appearance: She is well-developed.  ?HENT:  ?   Head: Normocephalic.  ?   Right Ear: Tympanic membrane normal.  ?   Left Ear: Tympanic membrane normal.  ?   Nose: Congestion present.  ?   Right Sinus: Maxillary sinus tenderness and frontal sinus tenderness present.  ?   Left Sinus: Maxillary sinus tenderness and frontal sinus tenderness present.  ?   Mouth/Throat:  ?   Mouth: Mucous membranes are moist.  ?   Tonsils: No tonsillar exudate or tonsillar abscesses.  ?Cardiovascular:  ?   Rate and Rhythm: Normal rate and regular rhythm.  ?   Heart sounds: Normal heart sounds.  ?Pulmonary:  ?   Effort: Pulmonary effort is normal.  ?   Breath sounds: Normal breath sounds.  ?Musculoskeletal:  ?   Cervical back: Normal range of motion and neck supple.  ?Skin: ?   General: Skin is warm.  ?Neurological:  ?   General: No focal deficit present.  ?   Mental Status: She is alert.  ?Psychiatric:     ?   Mood and Affect: Mood normal.  ? ? ? ?UC Treatments / Results  ?Labs ?(all labs ordered are listed, but only abnormal results are displayed) ?Labs Reviewed  ?CULTURE, GROUP A STREP [facility])  ?SARS CORONAVIRUS 2 (TAT 6-24 HRS)  ?POCT RAPID STREP A, ED / UC  ?POC INFLUENZA A AND B ANTIGEN (URGENT CARE ONLY)  ? ? ?EKG ? ? ?Radiology ?No results found. ? ?Procedures ?Procedures (including critical care time) ? ?Medications Ordered in UC ?Medications - No data to display ? ?Initial Impression / Assessment and Plan / UC Course  ?I have reviewed the triage vital signs and the nursing notes. ? ?Pertinent labs & imaging results that were  available during my care of the patient were reviewed by me and considered in my medical decision making (see chart for details). ? ?  ?Final Clinical Impressions(s) /  UC Diagnoses  ? ?Final diagnoses:  ?Upper respiratory tract infection, unspecified type  ?Viral illness  ? ? ? ?Discharge Instructions   ? ?  ?You were seen today for upper respiratory symptoms.  ?Your strep and flu test was negative.  ?Your covid test is pending and will be resulted tomorrow.  If this is positive we will call and start treatment.  ?Your symptoms appear to be viral.   I recommend over the counter tylenol or motrin for your pain.  I have sent out a cough syrup to help with your symptoms as well.  ?Please get plenty of rest and fluids.   ? ? ? ?ED Prescriptions   ? ? Medication Sig Dispense Auth. Provider  ? promethazine-dextromethorphan (PROMETHAZINE-DM) 6.25-15 MG/5ML syrup Take 5 mLs by mouth 4 (four) times daily as needed for cough. 118 mL Jannifer FranklinPiontek, Hasheem Voland, MD  ? ?  ? ?PDMP not reviewed this encounter. ?  Jannifer Franklin?Hoyte Ziebell, MD ?04/05/22 1305 ? ?

## 2022-04-05 NOTE — Discharge Instructions (Addendum)
You were seen today for upper respiratory symptoms.  ?Your strep and flu test was negative.  ?Your covid test is pending and will be resulted tomorrow.  If this is positive we will call and start treatment.  ?Your symptoms appear to be viral.   I recommend over the counter tylenol or motrin for your pain.  I have sent out a cough syrup to help with your symptoms as well.  ?Please get plenty of rest and fluids.   ?

## 2022-04-05 NOTE — ED Triage Notes (Signed)
Pt reports Body aches ,HA and body aches starting  Friday. ?

## 2022-04-06 LAB — SARS CORONAVIRUS 2 (TAT 6-24 HRS): SARS Coronavirus 2: NEGATIVE

## 2022-04-07 ENCOUNTER — Telehealth (HOSPITAL_COMMUNITY): Payer: Self-pay | Admitting: Emergency Medicine

## 2022-04-07 LAB — CULTURE, GROUP A STREP (THRC)

## 2022-04-07 MED ORDER — AZITHROMYCIN 250 MG PO TABS: 250.0000 mg | ORAL_TABLET | Freq: Every day | ORAL | 0 refills | Status: DC

## 2022-04-17 ENCOUNTER — Other Ambulatory Visit: Payer: Medicaid Other

## 2022-04-21 ENCOUNTER — Other Ambulatory Visit: Payer: Self-pay | Admitting: *Deleted

## 2022-04-21 ENCOUNTER — Ambulatory Visit: Payer: Medicaid Other

## 2022-04-21 MED ORDER — MEDROXYPROGESTERONE ACETATE 150 MG/ML IM SUSP: 150.0000 mg | INTRAMUSCULAR | 1 refills | Status: DC

## 2022-04-21 NOTE — Progress Notes (Signed)
TC from pt to front office requesting refill depo for 04/24/22 appt. Refill X 2. Pap due after 07/16/22 and did not come for postpartum visit. Pt notified via MyChart of need for Pap and no further RX after 07/16/22 until annual appt completed. ?

## 2022-04-24 ENCOUNTER — Ambulatory Visit (INDEPENDENT_AMBULATORY_CARE_PROVIDER_SITE_OTHER): Payer: Medicaid Other

## 2022-04-24 DIAGNOSIS — Z3042 Encounter for surveillance of injectable contraceptive: Secondary | ICD-10-CM | POA: Diagnosis not present

## 2022-04-24 MED ORDER — MEDROXYPROGESTERONE ACETATE 150 MG/ML IM SUSP
150.0000 mg | INTRAMUSCULAR | Status: DC
Start: 2022-04-24 — End: 2024-07-20
  Administered 2022-04-24 – 2023-04-06 (×3): 150 mg via INTRAMUSCULAR

## 2022-04-24 NOTE — Progress Notes (Signed)
Pt is in the office for depo, administered in and pt tolerated well. Next due Aug 4-18. .. Administrations This Visit     medroxyPROGESTERone (DEPO-PROVERA) injection 150 mg     Admin Date 04/24/2022 Action Given Dose 150 mg Route Intramuscular Administered By Katrina Stack, RN

## 2022-04-24 NOTE — Progress Notes (Signed)
Agree with nurses's documentation of this patient's clinic encounter.  Reene Harlacher L, MD  

## 2022-07-20 ENCOUNTER — Ambulatory Visit: Payer: Medicaid Other

## 2022-07-28 ENCOUNTER — Ambulatory Visit: Payer: Medicaid Other

## 2022-08-03 ENCOUNTER — Ambulatory Visit: Payer: Medicaid Other

## 2022-08-04 ENCOUNTER — Ambulatory Visit (INDEPENDENT_AMBULATORY_CARE_PROVIDER_SITE_OTHER): Payer: Medicaid Other

## 2022-08-04 VITALS — BP 154/97 | HR 66 | Ht 64.0 in | Wt 291.0 lb

## 2022-08-04 DIAGNOSIS — Z3042 Encounter for surveillance of injectable contraceptive: Secondary | ICD-10-CM | POA: Diagnosis not present

## 2022-08-04 LAB — POCT URINE PREGNANCY: Preg Test, Ur: NEGATIVE

## 2022-08-04 MED ORDER — MEDROXYPROGESTERONE ACETATE 150 MG/ML IM SUSP
150.0000 mg | Freq: Once | INTRAMUSCULAR | Status: AC
  Administered 2022-08-04: 150 mg via INTRAMUSCULAR

## 2022-08-04 NOTE — Progress Notes (Addendum)
SUBJECTIVE: Tanya Moreno is a 31 y.o. female who presents for DEPO Injection.  OBJECTIVE: Appears well, in no apparent distress.  Vital signs are normal.   ASSESSMENT: Need for BC, 2 weeks outside window.  Needs Annual Exam.  UPT today is NEGATIVE.  PLAN: DEPO Injection given in LD, tolerated well.  Next DEPO due Nov. 14-28, 2023  Make appt for Annual Exam.  Administrations This Visit     medroxyPROGESTERone (DEPO-PROVERA) injection 150 mg     Admin Date 08/04/2022 Action Given Dose 150 mg Route Intramuscular Administered By Maretta Bees, RMA

## 2022-08-28 ENCOUNTER — Other Ambulatory Visit: Payer: Self-pay | Admitting: Obstetrics

## 2022-08-28 DIAGNOSIS — I10 Essential (primary) hypertension: Secondary | ICD-10-CM

## 2022-09-29 ENCOUNTER — Ambulatory Visit (HOSPITAL_COMMUNITY)
Admission: EM | Admit: 2022-09-29 | Discharge: 2022-09-29 | Disposition: A | Discharge status: Home / Self Care | Payer: Medicaid Other | Attending: Physician Assistant | Admitting: Physician Assistant

## 2022-09-29 ENCOUNTER — Encounter (HOSPITAL_COMMUNITY): Payer: Self-pay | Admitting: *Deleted

## 2022-09-29 DIAGNOSIS — M545 Low back pain, unspecified: Secondary | ICD-10-CM | POA: Diagnosis not present

## 2022-09-29 MED ORDER — DICLOFENAC SODIUM 1 % EX GEL: 4.0000 g | Freq: Four times a day (QID) | CUTANEOUS | 1 refills | Status: DC

## 2022-09-29 MED ORDER — METHOCARBAMOL 500 MG PO TABS: 500.0000 mg | ORAL_TABLET | Freq: Four times a day (QID) | ORAL | 0 refills | Status: DC

## 2022-09-29 MED ORDER — MELOXICAM 15 MG PO TABS: 15.0000 mg | ORAL_TABLET | Freq: Every day | ORAL | 2 refills | Status: DC

## 2022-09-29 NOTE — ED Triage Notes (Signed)
Pt states that she has been having lower back pain since February. She said she has been seen for this before at [facility] and got a pain shot and she would like another one. She is taking tylenol and IBU without relief.

## 2022-09-29 NOTE — ED Provider Notes (Signed)
[facility]    CSN: [id number] Arrival date & time: 09/29/22  3976      History   Chief Complaint Chief Complaint  Patient presents with   Back Pain    HPI Tanya Moreno is a 31 y.o. female.   Pt complains of low back pain since February. Pt reports pain began after having a baby  The history is provided by the patient. No language interpreter was used.  Back Pain Location:  Lumbar spine Quality:  Aching Radiates to:  Does not radiate Pain severity:  Moderate Pain is:  Same all the time Onset quality:  Gradual Duration:  8 months Timing:  Intermittent Relieved by:  Nothing Worsened by:  Nothing Associated symptoms: no numbness, no tingling and no weakness     Past Medical History:  Diagnosis Date   Chlamydia    Chlamydia infection affecting pregnancy in first trimester 02/09/2017   Positive on 02/08/17, azithromycin 1000 mg PO x 1 dose sent to pt pharmacy and pt notified by phone by [facility] staff Positive on 7/30   Genital HSV    Last outbreak Nov 2013   Gestational diabetes    Gonorrhea    Hypertension    Pregnancy induced hypertension    Trichomonas     Patient Active Problem List   Diagnosis Date Noted   Vaginal delivery 01/19/2022   GBS carrier 01/12/2022   Chronic hypertension during pregnancy 12/22/2021   Trichomoniasis    Gestational diabetes 12/03/2021   Supervision of high risk pregnancy, antepartum 06/19/2021   Chronic hypertension 07/22/2019   Carpal tunnel syndrome during pregnancy 08/24/2017   History of HSV 04/14/2017   BMI 50.0-59.9, adult ([place]) 04/14/2017   Latex allergy 08/26/2011    Past Surgical History:  Procedure Laterality Date   NO PAST SURGERIES      OB History     Gravida  5   Para  5   Term  5   Preterm      AB      Living  5      SAB      IAB      Ectopic      Multiple  0   Live Births  5            Home Medications    Prior to Admission medications   Medication Sig Start Date  End Date Taking? Authorizing Provider  acetaminophen (TYLENOL) 500 MG tablet Take 2 tablets (1,000 mg total) by mouth every 8 (eight) hours as needed (pain). 01/21/22  Yes Genia Del, MD  medroxyPROGESTERone (DEPO-PROVERA) 150 MG/ML injection Inject 1 mL (150 mg total) into the muscle every 3 (three) months. 04/21/22  Yes Shelly Bombard, MD  azithromycin (ZITHROMAX) 250 MG tablet Take 1 tablet (250 mg total) by mouth daily. Take first 2 tablets together, then 1 every day until finished. 04/07/22   Lamptey, Myrene Galas, MD  diclofenac Sodium (VOLTAREN) 1 % GEL Apply 4 g topically 4 (four) times daily. 03/07/22   Lynden Oxford Scales, PA-C  furosemide (LASIX) 20 MG tablet Take 1 tablet (20 mg total) by mouth daily for 5 days. 01/21/22 01/26/22  Genia Del, MD  NIFEdipine (ADALAT CC) 30 MG 24 hr tablet Take 1 tablet (30 mg total) by mouth daily. 01/21/22   Genia Del, MD  Prenatal Vit-Fe Phos-FA-Omega (VITAFOL GUMMIES) 3.33-0.333-34.8 MG CHEW Chew 3 tablets by mouth daily. 06/19/21   Chancy Milroy, MD  promethazine-dextromethorphan (PROMETHAZINE-DM)  6.25-15 MG/5ML syrup Take 5 mLs by mouth 4 (four) times daily as needed for cough. 04/05/22   Jannifer Franklin, MD    Family History Family History  Problem Relation Age of Onset   Hypertension Maternal Grandmother     Social History Social History   Tobacco Use   Smoking status: Former    Types: Cigarettes    Quit date: 2015    Years since quitting: 8.8   Smokeless tobacco: Former    Quit date: 11/10/2014  Vaping Use   Vaping Use: Never used  Substance Use Topics   Alcohol use: No    Alcohol/week: 0.0 standard drinks of alcohol   Drug use: No     Allergies   Latex and Tape   Review of Systems Review of Systems  Musculoskeletal:  Positive for back pain.  Neurological:  Negative for tingling, weakness and numbness.  All other systems reviewed and are negative.    Physical Exam Triage Vital Signs ED Triage  Vitals  Enc Vitals Group     BP 09/29/22 0944 (!) 146/96     Pulse Rate 09/29/22 0944 64     Resp 09/29/22 0944 18     Temp 09/29/22 0944 98 F (36.7 C)     Temp Source 09/29/22 0944 Oral     SpO2 09/29/22 0944 97 %     Weight --      Height --      Head Circumference --      Peak Flow --      Pain Score 09/29/22 0942 6     Pain Loc --      Pain Edu? --      Excl. in GC? --    No data found.  Updated Vital Signs BP (!) 146/96 (BP Location: Left Arm)   Pulse 64   Temp 98 F (36.7 C) (Oral)   Resp 18   LMP 08/26/2022 (Approximate)   SpO2 97%   Visual Acuity Right Eye Distance:   Left Eye Distance:   Bilateral Distance:    Right Eye Near:   Left Eye Near:    Bilateral Near:     Physical Exam Vitals and nursing note reviewed.  Constitutional:      Appearance: She is well-developed.  HENT:     Head: Normocephalic.  Cardiovascular:     Rate and Rhythm: Normal rate.  Pulmonary:     Effort: Pulmonary effort is normal.  Abdominal:     General: Abdomen is flat. There is no distension.  Musculoskeletal:        General: Normal range of motion.     Cervical back: Normal range of motion.     Comments: Tender mid lumbar spine  pain with range of motion  nv and ns intact   Skin:    General: Skin is warm.  Neurological:     Mental Status: She is alert and oriented to person, place, and time.      UC Treatments / Results  Labs (all labs ordered are listed, but only abnormal results are displayed) Labs Reviewed - No data to display  EKG   Radiology No results found.  Procedures Procedures (including critical care time)  Medications Ordered in UC Medications - No data to display  Initial Impression / Assessment and Plan / UC Course  I have reviewed the triage vital signs and the nursing notes.  Pertinent labs & imaging results that were available during my care of the patient were reviewed  by me and considered in my medical decision making (see chart for  details).     MDM:  Pt has soreness with movement,  nv and ns intact  Final Clinical Impressions(s) / UC Diagnoses   Final diagnoses:  Acute low back pain, unspecified back pain laterality, unspecified whether sciatica present   Discharge Instructions   None    ED Prescriptions     Medication Sig Dispense Auth. Provider   diclofenac Sodium (VOLTAREN) 1 % GEL Apply 4 g topically 4 (four) times daily. 350 g Dontavia Brand K, [place]   methocarbamol (ROBAXIN) 500 MG tablet Take 1 tablet (500 mg total) by mouth 4 (four) times daily. 20 tablet Jacalyn Biggs K, [place]   meloxicam (MOBIC) 15 MG tablet Take 1 tablet (15 mg total) by mouth daily. 30 tablet Elson Areas, [place]      PDMP not reviewed this encounter. An After Visit Summary was printed and given to the patient.    Elson Areas, [place] 09/29/22 1016

## 2022-10-22 ENCOUNTER — Ambulatory Visit (INDEPENDENT_AMBULATORY_CARE_PROVIDER_SITE_OTHER): Payer: Medicaid Other | Admitting: General Practice

## 2022-10-22 ENCOUNTER — Other Ambulatory Visit: Payer: Self-pay | Admitting: General Practice

## 2022-10-22 ENCOUNTER — Other Ambulatory Visit: Payer: Self-pay | Admitting: Obstetrics

## 2022-10-22 DIAGNOSIS — Z3042 Encounter for surveillance of injectable contraceptive: Secondary | ICD-10-CM

## 2022-10-22 MED ORDER — MEDROXYPROGESTERONE ACETATE 150 MG/ML IM SUSP
150.0000 mg | Freq: Once | INTRAMUSCULAR | Status: AC
  Administered 2022-10-22: 150 mg via INTRAMUSCULAR

## 2022-10-22 MED ORDER — NIFEDIPINE ER 30 MG PO TB24: ORAL_TABLET | ORAL | 1 refills | Status: DC

## 2022-10-22 NOTE — Progress Notes (Signed)
Date last pap: 07-16-2021. Last Depo-Provera: 08-04-22. Side Effects if any: Pt tolerated well. Serum HCG indicated? Depo give on schedule. Depo-Provera 150 mg IM given by: Hope Pigeon, CMA in the LD per pt request. Next appointment due 2/1-2/15.   Pt requested refill of Nifedipine 30mg . Blood pressures were elevated today. Per , MD 60 day supply of Nifedipine sent to pharmacy. Pt advised to f/u with PCP for hypertension.

## 2022-10-23 NOTE — Progress Notes (Signed)
Patient was assessed and managed by nursing staff during this encounter. I have reviewed the chart and agree with the documentation and plan. I have also made any necessary editorial changes.  Warden Fillers, MD 10/23/2022 12:01 PM

## 2022-10-28 ENCOUNTER — Ambulatory Visit: Payer: Medicaid Other

## 2022-10-28 ENCOUNTER — Ambulatory Visit: Payer: Medicaid Other | Admitting: Obstetrics & Gynecology

## 2022-11-05 ENCOUNTER — Other Ambulatory Visit: Payer: Self-pay | Admitting: Obstetrics and Gynecology

## 2022-11-17 ENCOUNTER — Ambulatory Visit (HOSPITAL_COMMUNITY)
Admission: EM | Admit: 2022-11-17 | Discharge: 2022-11-17 | Disposition: A | Discharge status: Home / Self Care | Payer: Medicaid Other | Attending: Physician Assistant | Admitting: Physician Assistant

## 2022-11-17 ENCOUNTER — Encounter (HOSPITAL_COMMUNITY): Payer: Self-pay

## 2022-11-17 DIAGNOSIS — Z1152 Encounter for screening for COVID-19: Secondary | ICD-10-CM | POA: Insufficient documentation

## 2022-11-17 DIAGNOSIS — J069 Acute upper respiratory infection, unspecified: Secondary | ICD-10-CM | POA: Diagnosis not present

## 2022-11-17 DIAGNOSIS — R051 Acute cough: Secondary | ICD-10-CM | POA: Diagnosis present

## 2022-11-17 DIAGNOSIS — Z79899 Other long term (current) drug therapy: Secondary | ICD-10-CM | POA: Diagnosis not present

## 2022-11-17 DIAGNOSIS — J101 Influenza due to other identified influenza virus with other respiratory manifestations: Secondary | ICD-10-CM | POA: Insufficient documentation

## 2022-11-17 DIAGNOSIS — R509 Fever, unspecified: Secondary | ICD-10-CM | POA: Insufficient documentation

## 2022-11-17 LAB — RESP PANEL BY RT-PCR (FLU A&B, COVID) ARPGX2
Influenza A by PCR: NEGATIVE
Influenza B by PCR: POSITIVE — AB
SARS Coronavirus 2 by RT PCR: NEGATIVE

## 2022-11-17 MED ORDER — PROMETHAZINE-DM 6.25-15 MG/5ML PO SYRP: 5.0000 mL | ORAL_SOLUTION | Freq: Four times a day (QID) | ORAL | 0 refills | Status: DC | PRN

## 2022-11-17 MED ORDER — FLUTICASONE PROPIONATE 50 MCG/ACT NA SUSP: 1.0000 | Freq: Every day | NASAL | 2 refills | Status: DC

## 2022-11-17 MED ORDER — ACETAMINOPHEN 325 MG PO TABS
ORAL_TABLET | ORAL | Status: AC
  Filled 2022-11-17: qty 3

## 2022-11-17 MED ORDER — ACETAMINOPHEN 325 MG PO TABS
975.0000 mg | ORAL_TABLET | Freq: Once | ORAL | Status: AC
  Administered 2022-11-17: 975 mg via ORAL

## 2022-11-17 NOTE — ED Triage Notes (Signed)
Pt is here sore throat , cough, nasal congestion, runny nose, right ear pain , back pain, body aches, chills , loss of appetite x 3adys

## 2022-11-17 NOTE — Discharge Instructions (Signed)
We will contact you if you are positive for COVID or flu.  Use Promethazine DM for cough.  This make you sleepy so do not drive or drink alcohol with taking it.  Use Flonase for congestion.  You can alternate Tylenol and ibuprofen for pain and fever.  Make sure you rest and drink plenty of fluid.  If your symptoms or not improving please return for reevaluation.  If anything worsens and you have worsening cough, shortness of breath, fever, chest pain, nausea/vomiting interfering with oral intake you need to be seen immediately.

## 2022-11-17 NOTE — ED Provider Notes (Addendum)
[facility]    CSN: [id number] Arrival date & time: 11/17/22  1447      History   Chief Complaint Chief Complaint  Patient presents with   Cough   Sore Throat    HPI [name] is a 31 y.o. female.   Patient presents today with a 4-day history of URI symptoms including cough, sore throat, body aches, chills, congestion.  Denies any chest pain, shortness of breath, nausea, vomiting, diarrhea.  She has tried TheraFlu and other over-the-counter medications without improvement of symptoms.  She has not had any doses in the past 6 to 8 hours.  Denies any known sick contacts.  She has had COVID-19 vaccine but has not had a flu shot.  She has had COVID in the past.  Denies any recent antibiotics or steroids.  She is having difficulty with daily activities as result of symptoms.    Past Medical History:  Diagnosis Date   Chlamydia    Chlamydia infection affecting pregnancy in first trimester 02/09/2017   Positive on 02/08/17, azithromycin 1000 mg PO x 1 dose sent to pt pharmacy and pt notified by phone by [facility] staff Positive on 7/30   Genital HSV    Last outbreak Nov 2013   Gestational diabetes    Gonorrhea    Hypertension    Pregnancy induced hypertension    Trichomonas     Patient Active Problem List   Diagnosis Date Noted   Vaginal delivery 01/19/2022   GBS carrier 01/12/2022   Chronic hypertension during pregnancy 12/22/2021   Trichomoniasis    Gestational diabetes 12/03/2021   Supervision of high risk pregnancy, antepartum 06/19/2021   Chronic hypertension 07/22/2019   Carpal tunnel syndrome during pregnancy 08/24/2017   History of HSV 04/14/2017   BMI 50.0-59.9, adult ([facility]) 04/14/2017   Latex allergy 08/26/2011    Past Surgical History:  Procedure Laterality Date   NO PAST SURGERIES      OB History     Gravida  5   Para  5   Term  5   Preterm      AB      Living  5      SAB      IAB      Ectopic      Multiple  0   Live  Births  5            Home Medications    Prior to Admission medications   Medication Sig Start Date End Date Taking? Authorizing Provider  fluticasone (FLONASE) 50 MCG/ACT nasal spray Place 1 spray into both nostrils daily. 11/17/22  Yes Malissie Musgrave K, PA-C  promethazine-dextromethorphan (PROMETHAZINE-DM) 6.25-15 MG/5ML syrup Take 5 mLs by mouth 4 (four) times daily as needed for cough. 11/17/22  Yes Antionetta Ator, Noberto RetortErin K, PA-C  acetaminophen (TYLENOL) 500 MG tablet Take 2 tablets (1,000 mg total) by mouth every 8 (eight) hours as needed (pain). Patient not taking: Reported on 10/22/2022 01/21/22   Worthy RancherAlbert, Christina M, MD  diclofenac Sodium (VOLTAREN) 1 % GEL Apply 4 g topically 4 (four) times daily. Patient not taking: Reported on 10/22/2022 09/29/22   Elson AreasSofia, Leslie K, PA-C  furosemide (LASIX) 20 MG tablet Take 1 tablet (20 mg total) by mouth daily for 5 days. 01/21/22 01/26/22  Worthy RancherAlbert, Christina M, MD  medroxyPROGESTERone (DEPO-PROVERA) 150 MG/ML injection INJECT 1 ML (150 MG TOTAL) INTO THE MUSCLE EVERY 3 (THREE) MONTHS 10/22/22   Brock BadHarper, Charles A, MD  meloxicam [facility](MOBIC) 15  MG tablet Take 1 tablet (15 mg total) by mouth daily. Patient not taking: Reported on 10/22/2022 09/29/22 09/29/23  Elson Areas, PA-C  methocarbamol (ROBAXIN) 500 MG tablet Take 1 tablet (500 mg total) by mouth 4 (four) times daily. Patient not taking: Reported on 10/22/2022 09/29/22   Elson Areas, PA-C  NIFEdipine (ADALAT CC) 30 MG 24 hr tablet FOLLOW UP WITH PCP FOR HYPERTENSION 11/10/22   Warden Fillers, MD  Prenatal Vit-Fe Phos-FA-Omega (VITAFOL GUMMIES) 3.33-0.333-34.8 MG CHEW Chew 3 tablets by mouth daily. Patient not taking: Reported on 10/22/2022 06/19/21   Hermina Staggers, MD    Family History Family History  Problem Relation Age of Onset   Hypertension Maternal Grandmother     Social History Social History   Tobacco Use   Smoking status: Former    Types: Cigarettes    Quit date: 2015    Years  since quitting: 8.9   Smokeless tobacco: Former    Quit date: 11/10/2014  Vaping Use   Vaping Use: Never used  Substance Use Topics   Alcohol use: No    Alcohol/week: 0.0 standard drinks of alcohol   Drug use: No     Allergies   Latex and Tape   Review of Systems Review of Systems  Constitutional:  Positive for activity change, fatigue and fever. Negative for appetite change.  HENT:  Positive for congestion. Negative for sinus pressure, sneezing and sore throat.   Respiratory:  Positive for cough. Negative for shortness of breath.   Cardiovascular:  Negative for chest pain.  Gastrointestinal:  Negative for abdominal pain, diarrhea, nausea and vomiting.  Musculoskeletal:  Positive for arthralgias and myalgias.  Neurological:  Positive for headaches. Negative for dizziness and light-headedness.     Physical Exam Triage Vital Signs ED Triage Vitals  Enc Vitals Group     BP 11/17/22 1755 (!) 132/94     Pulse Rate 11/17/22 1755 99     Resp 11/17/22 1755 16     Temp 11/17/22 1755 (!) 102.2 F (39 C)     Temp Source 11/17/22 1755 Oral     SpO2 11/17/22 1755 95 %     Weight --      Height --      Head Circumference --      Peak Flow --      Pain Score 11/17/22 1753 10     Pain Loc --      Pain Edu? --      Excl. in GC? --    No data found.  Updated Vital Signs BP 129/87 (BP Location: Left Arm)   Pulse 99   Temp (!) 100.6 F (38.1 C) (Oral)   Resp 16   SpO2 95%   Visual Acuity Right Eye Distance:   Left Eye Distance:   Bilateral Distance:    Right Eye Near:   Left Eye Near:    Bilateral Near:     Physical Exam Vitals reviewed.  Constitutional:      General: She is awake. She is not in acute distress.    Appearance: Normal appearance. She is well-developed. She is not ill-appearing.     Comments: Very pleasant female appears stated age in no acute distress sitting comfortably in exam room  HENT:     Head: Normocephalic and atraumatic.     Right Ear:  Tympanic membrane, ear canal and external ear normal. Tympanic membrane is not erythematous or bulging.     Left Ear: Ear canal  and external ear normal. There is impacted cerumen.     Nose:     Right Sinus: Maxillary sinus tenderness present. No frontal sinus tenderness.     Left Sinus: Maxillary sinus tenderness present. No frontal sinus tenderness.     Mouth/Throat:     Pharynx: Uvula midline. No oropharyngeal exudate or posterior oropharyngeal erythema.  Cardiovascular:     Rate and Rhythm: Normal rate and regular rhythm.     Heart sounds: Normal heart sounds, S1 normal and S2 normal. No murmur heard. Pulmonary:     Effort: Pulmonary effort is normal.     Breath sounds: Normal breath sounds. No wheezing, rhonchi or rales.  Lymphadenopathy:     Head:     Right side of head: No tonsillar adenopathy.  Psychiatric:        Behavior: Behavior is cooperative.      UC Treatments / Results  Labs (all labs ordered are listed, but only abnormal results are displayed) Labs Reviewed  RESP PANEL BY RT-PCR (FLU A&B, COVID) [id number]    EKG   Radiology No results found.  Procedures Procedures (including critical care time)  Medications Ordered in UC Medications  acetaminophen (TYLENOL) tablet 975 mg (975 mg Oral Given 11/17/22 1826)    Initial Impression / Assessment and Plan / UC Course  I have reviewed the triage vital signs and the nursing notes.  Pertinent labs & imaging results that were available during my care of the patient were reviewed by me and considered in my medical decision making (see chart for details).     Concern for viral etiology given clinical presentation.  She is outside the window effectiveness for Tamiflu.  We did discuss potential antiviral therapy for COVID but after shared decision making patient declined to initiate this if she is positive for COVID so metabolic panel was not obtained to monitor kidney function.  She is a candidate based on her BMI and  history of hypertension so if she changes her mind we could consider molnupiravir.  Will treat symptomatically.  She was started on Promethazine DM with instruction not to drive or drink alcohol with taking this medication.  Will use Flonase for congestion and encouraged her to use over-the-counter medications including Mucinex, Tylenol, ibuprofen.  She is to rest and drink plenty of fluid.  Discussed that if her symptoms are improving within a week she should return for reevaluation.  If she has any worsening symptoms that she needs to be seen immediately including fever not responding medication, chest pain, shortness of breath, nausea/vomiting interfering with oral intake, weakness.  Strict return precautions given.  Work excuse note with current CDC return to work guidelines based on COVID test result provided during visit.  Final Clinical Impressions(s) / UC Diagnoses   Final diagnoses:  Upper respiratory tract infection, unspecified type  Acute cough  Fever, unspecified     Discharge Instructions      We will contact you if you are positive for COVID or flu.  Use Promethazine DM for cough.  This make you sleepy so do not drive or drink alcohol with taking it.  Use Flonase for congestion.  You can alternate Tylenol and ibuprofen for pain and fever.  Make sure you rest and drink plenty of fluid.  If your symptoms or not improving please return for reevaluation.  If anything worsens and you have worsening cough, shortness of breath, fever, chest pain, nausea/vomiting interfering with oral intake you need to be seen immediately.  ED Prescriptions     Medication Sig Dispense Auth. Provider   promethazine-dextromethorphan (PROMETHAZINE-DM) 6.25-15 MG/5ML syrup Take 5 mLs by mouth 4 (four) times daily as needed for cough. 118 mL Anastazja Isaac K, PA-C   fluticasone (FLONASE) 50 MCG/ACT nasal spray Place 1 spray into both nostrils daily. 16 g Marian Meneely K, PA-C      PDMP not reviewed this  encounter.   Jeani Hawking, PA-C 11/17/22 1847    Jeani Hawking, PA-C 11/17/22 1848

## 2023-01-07 ENCOUNTER — Ambulatory Visit (HOSPITAL_COMMUNITY)
Admission: EM | Admit: 2023-01-07 | Discharge: 2023-01-07 | Disposition: A | Discharge status: Home / Self Care | Payer: Medicaid Other | Attending: Family Medicine | Admitting: Family Medicine

## 2023-01-07 ENCOUNTER — Encounter (HOSPITAL_COMMUNITY): Payer: Self-pay

## 2023-01-07 DIAGNOSIS — Z1152 Encounter for screening for COVID-19: Secondary | ICD-10-CM | POA: Insufficient documentation

## 2023-01-07 DIAGNOSIS — J069 Acute upper respiratory infection, unspecified: Secondary | ICD-10-CM | POA: Diagnosis present

## 2023-01-07 LAB — POC INFLUENZA A AND B ANTIGEN (URGENT CARE ONLY)
Influenza A Ag: NEGATIVE
Influenza B Ag: NEGATIVE

## 2023-01-07 NOTE — ED Triage Notes (Signed)
Patient with c/o headache, runny nose and body aches since yesterday. Denies fever, but states she is hot and cold.

## 2023-01-07 NOTE — Discharge Instructions (Addendum)
You were seen today for upper respiratory symptoms. Your flu swab was negative, and you were swabbed for covid.  This will be resulted tomorrow.  If positive we could treat you with an antiviral if needed.  In the mean time I recommend you get plenty of rest.  Please increase fluids.  You may use tylenol and motrin for headache and body aches.  Use over the counter claritin or zyrtec for sinus congestion and drainage as well.

## 2023-01-07 NOTE — ED Provider Notes (Signed)
[place]    CSN: [id number] Arrival date & time: 01/07/23  5852      History   Chief Complaint Chief Complaint  Patient presents with   Headache    HPI Tanya Moreno is a 32 y.o. female.   Patient is here for headache, runny nose, body aches since yesterday.  No known fevers, but feels hot/cold.  Mild cough.  No wheezing or sob. Her kids have been sick with a cold.  She has not taken any medications.        Past Medical History:  Diagnosis Date   Chlamydia    Chlamydia infection affecting pregnancy in first trimester 02/09/2017   Positive on 02/08/17, azithromycin 1000 mg PO x 1 dose sent to pt pharmacy and pt notified by phone by [facility]U staff Positive on 7/30   Genital HSV    Last outbreak Nov 2013   Gestational diabetes    Gonorrhea    Hypertension    Pregnancy induced hypertension    Trichomonas     Patient Active Problem List   Diagnosis Date Noted   Vaginal delivery 01/19/2022   GBS carrier 01/12/2022   Chronic hypertension during pregnancy 12/22/2021   Trichomoniasis    Gestational diabetes 12/03/2021   Supervision of high risk pregnancy, antepartum 06/19/2021   Chronic hypertension 07/22/2019   Carpal tunnel syndrome during pregnancy 08/24/2017   History of HSV 04/14/2017   BMI 50.0-59.9, adult ([place]) 04/14/2017   Latex allergy 08/26/2011    Past Surgical History:  Procedure Laterality Date   NO PAST SURGERIES      OB History     Gravida  5   Para  5   Term  5   Preterm      AB      Living  5      SAB      IAB      Ectopic      Multiple  0   Live Births  5            Home Medications    Prior to Admission medications   Medication Sig Start Date End Date Taking? Authorizing Provider  acetaminophen (TYLENOL) 500 MG tablet Take 2 tablets (1,000 mg total) by mouth every 8 (eight) hours as needed (pain). Patient not taking: Reported on 10/22/2022 01/21/22   Genia Del, MD  diclofenac Sodium  (VOLTAREN) 1 % GEL Apply 4 g topically 4 (four) times daily. Patient not taking: Reported on 10/22/2022 09/29/22   Fransico Meadow, PA-C  fluticasone [facility]) 50 MCG/ACT nasal spray Place 1 spray into both nostrils daily. 11/17/22   Raspet, Derry Skill, PA-C  furosemide (LASIX) 20 MG tablet Take 1 tablet (20 mg total) by mouth daily for 5 days. 01/21/22 01/26/22  Genia Del, MD  medroxyPROGESTERone (DEPO-PROVERA) 150 MG/ML injection INJECT 1 ML (150 MG TOTAL) INTO THE MUSCLE EVERY 3 (THREE) MONTHS 10/22/22   Shelly Bombard, MD  meloxicam (MOBIC) 15 MG tablet Take 1 tablet (15 mg total) by mouth daily. Patient not taking: Reported on 10/22/2022 09/29/22 09/29/23  Fransico Meadow, PA-C  methocarbamol (ROBAXIN) 500 MG tablet Take 1 tablet (500 mg total) by mouth 4 (four) times daily. Patient not taking: Reported on 10/22/2022 09/29/22   Fransico Meadow, PA-C  NIFEdipine (ADALAT CC) 30 MG 24 hr tablet FOLLOW UP WITH PCP FOR HYPERTENSION 11/10/22   Griffin Basil, MD  Prenatal Vit-Fe Phos-FA-Omega (VITAFOL GUMMIES) 3.33-0.333-34.8 [place]  3 tablets by mouth daily. Patient not taking: Reported on 10/22/2022 06/19/21   Chancy Milroy, MD  promethazine-dextromethorphan (PROMETHAZINE-DM) 6.25-15 MG/5ML syrup Take 5 mLs by mouth 4 (four) times daily as needed for cough. 11/17/22   Raspet, Derry Skill, PA-C    Family History Family History  Problem Relation Age of Onset   Hypertension Maternal Grandmother     Social History Social History   Tobacco Use   Smoking status: Former    Types: Cigarettes    Quit date: 2015    Years since quitting: 9.0   Smokeless tobacco: Former    Quit date: 11/10/2014  Vaping Use   Vaping Use: Never used  Substance Use Topics   Alcohol use: No    Alcohol/week: 0.0 standard drinks of alcohol   Drug use: No     Allergies   Latex and Tape   Review of Systems Review of Systems  Constitutional:  Positive for fatigue. Negative for chills and fever.   HENT:  Positive for congestion, rhinorrhea, sinus pressure, sinus pain and sore throat.   Respiratory:  Positive for cough.   Cardiovascular: Negative.   Gastrointestinal: Negative.   Genitourinary: Negative.   Musculoskeletal:  Positive for myalgias.  Neurological:  Positive for headaches.  Psychiatric/Behavioral: Negative.       Physical Exam Triage Vital Signs ED Triage Vitals  Enc Vitals Group     BP 01/07/23 0925 (!) 150/106     Pulse Rate 01/07/23 0925 97     Resp 01/07/23 0925 20     Temp 01/07/23 0925 98.6 F (37 C)     Temp Source 01/07/23 0925 Oral     SpO2 01/07/23 0925 100 %     Weight --      Height --      Head Circumference --      Peak Flow --      Pain Score 01/07/23 0926 7     Pain Loc --      Pain Edu? --      Excl. in [place]? --    No data found.  Updated Vital Signs BP (!) 150/106 (BP Location: Left Arm)   Pulse 97   Temp 98.6 F (37 C) (Oral)   Resp 20   SpO2 100%   Visual Acuity Right Eye Distance:   Left Eye Distance:   Bilateral Distance:    Right Eye Near:   Left Eye Near:    Bilateral Near:     Physical Exam Constitutional:      Appearance: She is well-developed.  HENT:     Nose:     Right Sinus: Maxillary sinus tenderness and frontal sinus tenderness present.     Left Sinus: Maxillary sinus tenderness and frontal sinus tenderness present.     Mouth/Throat:     Mouth: Mucous membranes are moist.     Pharynx: Oropharynx is clear.  Cardiovascular:     Rate and Rhythm: Normal rate and regular rhythm.  Pulmonary:     Effort: Pulmonary effort is normal.     Breath sounds: Normal breath sounds.  Musculoskeletal:     Cervical back: Normal range of motion and neck supple. No tenderness.  Skin:    General: Skin is warm.  Neurological:     General: No focal deficit present.     Mental Status: She is alert.  Psychiatric:        Mood and Affect: Mood normal.      UC Treatments / Results  Labs (all labs ordered are listed,  but only abnormal results are displayed) Labs Reviewed  SARS CORONAVIRUS 2 (TAT 6-24 HRS)  POC INFLUENZA A AND B ANTIGEN (URGENT CARE ONLY)    EKG   Radiology No results found.  Procedures Procedures (including critical care time)  Medications Ordered in UC Medications - No data to display  Initial Impression / Assessment and Plan / UC Course  I have reviewed the triage vital signs and the nursing notes.  Pertinent labs & imaging results that were available during my care of the patient were reviewed by me and considered in my medical decision making (see chart for details).    Final Clinical Impressions(s) / UC Diagnoses   Final diagnoses:  Upper respiratory tract infection, unspecified type  Encounter for screening for COVID-19     Discharge Instructions      You were seen today for upper respiratory symptoms. Your flu swab was negative, and you were swabbed for covid.  This will be resulted tomorrow.  If positive we could treat you with an antiviral if needed.  In the mean time I recommend you get plenty of rest.  Please increase fluids.  You may use tylenol and motrin for headache and body aches.  Use over the counter claritin or zyrtec for sinus congestion and drainage as well.     ED Prescriptions   None    PDMP not reviewed this encounter.   Rondel Oh, MD 01/07/23 1007

## 2023-01-08 LAB — SARS CORONAVIRUS 2 (TAT 6-24 HRS): SARS Coronavirus 2: POSITIVE — AB

## 2023-01-14 ENCOUNTER — Ambulatory Visit: Payer: Medicaid Other

## 2023-01-15 ENCOUNTER — Ambulatory Visit (INDEPENDENT_AMBULATORY_CARE_PROVIDER_SITE_OTHER): Payer: Medicaid Other

## 2023-01-15 DIAGNOSIS — Z3042 Encounter for surveillance of injectable contraceptive: Secondary | ICD-10-CM

## 2023-01-15 NOTE — Progress Notes (Signed)
Pt is in the office for depo injection. Administered in LD per pt request and pt tolerated well. Next due April 27- May 11 .Marland Kitchen Administrations This Visit     medroxyPROGESTERone (DEPO-PROVERA) injection 150 mg     Admin Date 01/15/2023 Action Given Dose 150 mg Route Intramuscular Administered By Hinton Lovely, RN

## 2023-03-05 ENCOUNTER — Encounter (HOSPITAL_COMMUNITY): Payer: Self-pay

## 2023-03-05 ENCOUNTER — Ambulatory Visit (HOSPITAL_COMMUNITY)
Admission: EM | Admit: 2023-03-05 | Discharge: 2023-03-05 | Disposition: A | Discharge status: Home / Self Care | Payer: Medicaid Other | Attending: Nurse Practitioner | Admitting: Nurse Practitioner

## 2023-03-05 DIAGNOSIS — J029 Acute pharyngitis, unspecified: Secondary | ICD-10-CM | POA: Insufficient documentation

## 2023-03-05 LAB — POCT RAPID STREP A, ED / UC: Streptococcus, Group A Screen (Direct): NEGATIVE

## 2023-03-05 MED ORDER — LIDOCAINE VISCOUS HCL 2 % MT SOLN: 15.0000 mL | OROMUCOSAL | 0 refills | Status: DC | PRN

## 2023-03-05 NOTE — ED Triage Notes (Signed)
Patient c/o sore throat, generalized body aches, and nasal congestion x 2 days.  Patient states she has been taking Tylenol cold and flu and the last dose was this morning at 0800.

## 2023-03-05 NOTE — ED Provider Notes (Signed)
[place]    CSN: [id number] Arrival date & time: 03/05/23  1112      History   Chief Complaint Chief Complaint  Patient presents with   Sore Throat   Generalized Body Aches   Nasal Congestion    HPI Tanya Moreno is a 32 y.o. female.   Patient presents today for 2-day history of bodyaches, congested cough, nasal congestion and runny nose, sore throat, headache that has improved, decreased appetite, loss of taste that has not improved, and fatigue.  She denies fever, chills, dry cough, shortness of breath or chest pain, postnasal drainage, ear pain, abdominal pain, nausea/vomiting, and diarrhea.  No new rash.  Has been taking Tylenol Cold and flu which helped with her symptoms.  Patient denies history of allergies AntiBac therapy.  She denies antibiotic use in the past 90 days.  No known sick contacts and no contacts with strep throat.  Has tested positive for influenza B and COVID-19 in the past few months.    Past Medical History:  Diagnosis Date   Chlamydia    Chlamydia infection affecting pregnancy in first trimester 02/09/2017   Positive on 02/08/17, azithromycin 1000 mg PO x 1 dose sent to pt pharmacy and pt notified by phone by [facility]U staff Positive on 7/30   Genital HSV    Last outbreak Nov 2013   Gestational diabetes    Gonorrhea    Hypertension    Pregnancy induced hypertension    Trichomonas     Patient Active Problem List   Diagnosis Date Noted   Vaginal delivery 01/19/2022   GBS carrier 01/12/2022   Chronic hypertension during pregnancy 12/22/2021   Trichomoniasis    Gestational diabetes 12/03/2021   Supervision of high risk pregnancy, antepartum 06/19/2021   Chronic hypertension 07/22/2019   Carpal tunnel syndrome during pregnancy 08/24/2017   History of HSV 04/14/2017   BMI 50.0-59.9, adult ([place]) 04/14/2017   Latex allergy 08/26/2011    Past Surgical History:  Procedure Laterality Date   NO PAST SURGERIES      OB History      Gravida  5   Para  5   Term  5   Preterm      AB      Living  5      SAB      IAB      Ectopic      Multiple  0   Live Births  5            Home Medications    Prior to Admission medications   Medication Sig Start Date End Date Taking? Authorizing Provider  lidocaine (XYLOCAINE) 2 % solution Use as directed 15 mLs in the mouth or throat as needed for mouth pain. Gargle and spit as needed for throat pain 03/05/23  Yes Noemi Chapel A, NP  fluticasone (FLONASE) 50 MCG/ACT nasal spray Place 1 spray into both nostrils daily. 11/17/22   Raspet, Derry Skill, PA-C  furosemide (LASIX) 20 MG tablet Take 1 tablet (20 mg total) by mouth daily for 5 days. 01/21/22 01/26/22  Genia Del, MD  medroxyPROGESTERone (DEPO-PROVERA) 150 MG/ML injection INJECT 1 ML (150 MG TOTAL) INTO THE MUSCLE EVERY 3 (THREE) MONTHS 10/22/22   Shelly Bombard, MD  NIFEdipine (ADALAT CC) 30 MG 24 hr tablet FOLLOW UP WITH PCP FOR HYPERTENSION 11/10/22   Griffin Basil, MD    Family History Family History  Problem Relation Age of Onset   Hypertension  Maternal Grandmother     Social History Social History   Tobacco Use   Smoking status: Former    Types: Cigarettes    Quit date: 2015    Years since quitting: 9.2   Smokeless tobacco: Former    Quit date: 11/10/2014  Vaping Use   Vaping Use: Never used  Substance Use Topics   Alcohol use: No    Alcohol/week: 0.0 standard drinks of alcohol   Drug use: No     Allergies   Latex and Tape   Review of Systems Review of Systems Per HPI  Physical Exam Triage Vital Signs ED Triage Vitals  Enc Vitals Group     BP 03/05/23 1213 (!) 139/95     Pulse Rate 03/05/23 1213 93     Resp 03/05/23 1213 16     Temp 03/05/23 1213 97.9 F (36.6 C)     Temp Source 03/05/23 1213 Oral     SpO2 03/05/23 1213 96 %     Weight --      Height --      Head Circumference --      Peak Flow --      Pain Score 03/05/23 1215 4     Pain Loc --       Pain Edu? --      Excl. in [place]? --    No data found.  Updated Vital Signs BP (!) 139/95 (BP Location: Right Arm)   Pulse 93   Temp 97.9 F (36.6 C) (Oral)   Resp 16   SpO2 96%   Visual Acuity Right Eye Distance:   Left Eye Distance:   Bilateral Distance:    Right Eye Near:   Left Eye Near:    Bilateral Near:     Physical Exam Vitals and nursing note reviewed.  Constitutional:      General: She is not in acute distress.    Appearance: Normal appearance. She is not ill-appearing or toxic-appearing.  HENT:     Head: Normocephalic and atraumatic.     Right Ear: Tympanic membrane, ear canal and external ear normal. No drainage, swelling or tenderness. No middle ear effusion. Tympanic membrane is not erythematous.     Left Ear: Tympanic membrane, ear canal and external ear normal. No drainage, swelling or tenderness.  No middle ear effusion. Tympanic membrane is not erythematous.     Nose: Congestion and rhinorrhea present.     Mouth/Throat:     Mouth: Mucous membranes are moist.     Pharynx: Oropharynx is clear. Posterior oropharyngeal erythema present. No oropharyngeal exudate.     Tonsils: No tonsillar exudate. 2+ on the right. 2+ on the left.     Comments: Erythematous lesions to posterior pharynx Eyes:     General: No scleral icterus.    Extraocular Movements: Extraocular movements intact.  Cardiovascular:     Rate and Rhythm: Normal rate and regular rhythm.  Pulmonary:     Effort: Pulmonary effort is normal. No respiratory distress.     Breath sounds: Normal breath sounds. No wheezing, rhonchi or rales.  Abdominal:     General: Abdomen is flat. Bowel sounds are normal. There is no distension.     Palpations: Abdomen is soft.  Musculoskeletal:     Cervical back: Normal range of motion and neck supple.  Lymphadenopathy:     Cervical: No cervical adenopathy.  Skin:    General: Skin is warm and dry.     Coloration: Skin is not jaundiced or  pale.     Findings: No  erythema or rash.  Neurological:     Mental Status: She is alert and oriented to person, place, and time.     Motor: No weakness.  Psychiatric:        Behavior: Behavior is cooperative.      UC Treatments / Results  Labs (all labs ordered are listed, but only abnormal results are displayed) Labs Reviewed  CULTURE, GROUP A STREP [facility])  POCT RAPID STREP A, ED / UC    EKG   Radiology No results found.  Procedures Procedures (including critical care time)  Medications Ordered in UC Medications - No data to display  Initial Impression / Assessment and Plan / UC Course  I have reviewed the triage vital signs and the nursing notes.  Pertinent labs & imaging results that were available during my care of the patient were reviewed by me and considered in my medical decision making (see chart for details).   Patient is well-appearing, normotensive, afebrile, not tachycardic, not tachypneic, oxygenating well on room air.   1. Acute pharyngitis, unspecified etiology Rapid strep throat test today is negative Throat culture is pending Centor score today is 1 Will defer treatment until culture results Start lidocaine rinses, suspect possible viral stomatitis Viral testing deferred given history of influenza B and COVID-19 related to seasonal Other supportive care discussed ER and return precautions also discussed  The patient was given the opportunity to ask questions.  All questions answered to their satisfaction.  The patient is in agreement to this plan.    Final Clinical Impressions(s) / UC Diagnoses   Final diagnoses:  Acute pharyngitis, unspecified etiology     Discharge Instructions      The rapid strep throat test today is negative We are sending for a throat culture we will call you early next week if it is positive In the meantime, start lidocaine rinses to help with the throat pain If the symptoms are caused by a virus, they should improve over the next few  days You can also take Tylenol or ibuprofen as needed for pain     ED Prescriptions     Medication Sig Dispense Auth. Provider   lidocaine (XYLOCAINE) 2 % solution Use as directed 15 mLs in the mouth or throat as needed for mouth pain. Gargle and spit as needed for throat pain 100 mL Eulogio Bear, NP      PDMP not reviewed this encounter.   Eulogio Bear, NP 03/05/23 [phone]

## 2023-03-05 NOTE — Discharge Instructions (Addendum)
The rapid strep throat test today is negative We are sending for a throat culture we will call you early next week if it is positive In the meantime, start lidocaine rinses to help with the throat pain If the symptoms are caused by a virus, they should improve over the next few days You can also take Tylenol or ibuprofen as needed for pain

## 2023-03-06 LAB — CULTURE, GROUP A STREP (THRC)

## 2023-03-08 ENCOUNTER — Ambulatory Visit (HOSPITAL_COMMUNITY)
Admission: EM | Admit: 2023-03-08 | Discharge: 2023-03-08 | Disposition: A | Discharge status: Home / Self Care | Payer: Medicaid Other

## 2023-03-08 ENCOUNTER — Telehealth (HOSPITAL_COMMUNITY): Payer: Self-pay | Admitting: Emergency Medicine

## 2023-03-08 MED ORDER — AMOXICILLIN 500 MG PO CAPS: 500.0000 mg | ORAL_CAPSULE | Freq: Two times a day (BID) | ORAL | 0 refills | Status: AC

## 2023-03-19 ENCOUNTER — Encounter (HOSPITAL_COMMUNITY): Payer: Self-pay

## 2023-03-19 ENCOUNTER — Ambulatory Visit (HOSPITAL_COMMUNITY)
Admission: EM | Admit: 2023-03-19 | Discharge: 2023-03-19 | Disposition: A | Discharge status: Home / Self Care | Payer: Medicaid Other | Attending: Emergency Medicine | Admitting: Emergency Medicine

## 2023-03-19 DIAGNOSIS — R519 Headache, unspecified: Secondary | ICD-10-CM | POA: Diagnosis not present

## 2023-03-19 MED ORDER — METOCLOPRAMIDE HCL 5 MG/ML IJ SOLN
INTRAMUSCULAR | Status: AC
  Filled 2023-03-19: qty 2

## 2023-03-19 MED ORDER — DEXAMETHASONE SODIUM PHOSPHATE 10 MG/ML IJ SOLN
10.0000 mg | Freq: Once | INTRAMUSCULAR | Status: AC
  Administered 2023-03-19: 10 mg via INTRAMUSCULAR

## 2023-03-19 MED ORDER — KETOROLAC TROMETHAMINE 60 MG/2ML IM SOLN
INTRAMUSCULAR | Status: AC
  Filled 2023-03-19: qty 2

## 2023-03-19 MED ORDER — METOCLOPRAMIDE HCL 5 MG/ML IJ SOLN
10.0000 mg | Freq: Once | INTRAMUSCULAR | Status: AC
  Administered 2023-03-19: 10 mg via INTRAMUSCULAR

## 2023-03-19 MED ORDER — DEXAMETHASONE SODIUM PHOSPHATE 10 MG/ML IJ SOLN
INTRAMUSCULAR | Status: AC
  Filled 2023-03-19: qty 1

## 2023-03-19 MED ORDER — CETIRIZINE HCL 10 MG PO TABS: 10.0000 mg | ORAL_TABLET | Freq: Every day | ORAL | 0 refills | Status: DC

## 2023-03-19 MED ORDER — KETOROLAC TROMETHAMINE 60 MG/2ML IM SOLN
60.0000 mg | Freq: Once | INTRAMUSCULAR | Status: AC
  Administered 2023-03-19: 60 mg via INTRAMUSCULAR

## 2023-03-19 NOTE — ED Provider Notes (Signed)
[facility]    CSN: [id number] Arrival date & time: 03/19/23  1402      History   Chief Complaint Chief Complaint  Patient presents with   Headache    HPI Tanya Moreno is a 32 y.o. female.   Patient presents to clinic for ongoing headache.  Headache has been present but intermittent for the past week.  She was recently diagnosed with strep April 1 and completed her course of 10 days of amoxicillin.  Throughout this time she also had a headache.  She denies any light sensitivity, nausea, vomiting.  Reports current pain is 7 out of 10 and laying down does make it better.  She has been quite stressed at work, has not been getting enough sleep per patient. Has taken tylenol and IBU, last IBU dose this AM.       The history is provided by the patient and medical records.  Headache Associated symptoms: no abdominal pain, no back pain, no cough, no fatigue, no fever and no sore throat     Past Medical History:  Diagnosis Date   Chlamydia    Chlamydia infection affecting pregnancy in first trimester 02/09/2017   Positive on 02/08/17, azithromycin 1000 mg PO x 1 dose sent to pt pharmacy and pt notified by phone by [facility]U staff Positive on 7/30   Genital HSV    Last outbreak Nov 2013   Gestational diabetes    Gonorrhea    Hypertension    Pregnancy induced hypertension    Trichomonas     Patient Active Problem List   Diagnosis Date Noted   Vaginal delivery 01/19/2022   GBS carrier 01/12/2022   Chronic hypertension during pregnancy 12/22/2021   Trichomoniasis    Gestational diabetes 12/03/2021   Supervision of high risk pregnancy, antepartum 06/19/2021   Chronic hypertension 07/22/2019   Carpal tunnel syndrome during pregnancy 08/24/2017   History of HSV 04/14/2017   BMI 50.0-59.9, adult 04/14/2017   Latex allergy 08/26/2011    Past Surgical History:  Procedure Laterality Date   NO PAST SURGERIES      OB History     Gravida  5   Para  5   Term  5    Preterm      AB      Living  5      SAB      IAB      Ectopic      Multiple  0   Live Births  5            Home Medications    Prior to Admission medications   Medication Sig Start Date End Date Taking? Authorizing Provider  cetirizine (ZYRTEC ALLERGY) 10 MG tablet Take 1 tablet (10 mg total) by mouth daily. 03/19/23 04/18/23 Yes Rinaldo Ratel, [place] N, FNP  lidocaine (XYLOCAINE) 2 % solution Use as directed 15 mLs in the mouth or throat as needed for mouth pain. Gargle and spit as needed for throat pain 03/05/23  Yes Cathlean Marseilles A, NP  fluticasone (FLONASE) 50 MCG/ACT nasal spray Place 1 spray into both nostrils daily. 11/17/22   Raspet, Noberto Retort, PA-C  furosemide (LASIX) 20 MG tablet Take 1 tablet (20 mg total) by mouth daily for 5 days. 01/21/22 01/26/22  Worthy Rancher, MD  medroxyPROGESTERone (DEPO-PROVERA) 150 MG/ML injection INJECT 1 ML (150 MG TOTAL) INTO THE MUSCLE EVERY 3 (THREE) MONTHS 10/22/22   Brock Bad, MD  NIFEdipine (ADALAT CC) 30 MG 24 hr  tablet FOLLOW UP WITH PCP FOR HYPERTENSION 11/10/22   Warden Fillers, MD    Family History Family History  Problem Relation Age of Onset   Hypertension Maternal Grandmother     Social History Social History   Tobacco Use   Smoking status: Former    Types: Cigarettes    Quit date: 2015    Years since quitting: 9.2   Smokeless tobacco: Former    Quit date: 11/10/2014  Vaping Use   Vaping Use: Never used  Substance Use Topics   Alcohol use: No    Alcohol/week: 0.0 standard drinks of alcohol   Drug use: No     Allergies   Latex and Tape   Review of Systems Review of Systems  Constitutional:  Negative for fatigue and fever.  HENT:  Negative for sore throat.   Respiratory:  Negative for cough and shortness of breath.   Cardiovascular:  Negative for chest pain.  Gastrointestinal:  Negative for abdominal pain.  Genitourinary:  Negative for dysuria.  Musculoskeletal:  Negative for back pain.   Neurological:  Positive for headaches. Negative for syncope and light-headedness.     Physical Exam Triage Vital Signs ED Triage Vitals  Enc Vitals Group     BP 03/19/23 1518 131/88     Pulse Rate 03/19/23 1518 77     Resp 03/19/23 1518 18     Temp 03/19/23 1518 (!) 97.4 F (36.3 C)     Temp Source 03/19/23 1518 Oral     SpO2 03/19/23 1518 99 %     Weight 03/19/23 1518 254 lb (115.2 kg)     Height 03/19/23 1518 5\' 4"  (1.626 m)     Head Circumference --      Peak Flow --      Pain Score 03/19/23 1516 7     Pain Loc --      Pain Edu? --      Excl. in GC? --    No data found.  Updated Vital Signs BP 131/88 (BP Location: Left Arm)   Pulse 77   Temp (!) 97.4 F (36.3 C) (Oral)   Resp 18   Ht 5\' 4"  (1.626 m)   Wt 254 lb (115.2 kg)   LMP 02/04/2023 (Approximate)   SpO2 99%   Breastfeeding No   BMI 43.60 kg/m   Visual Acuity Right Eye Distance:   Left Eye Distance:   Bilateral Distance:    Right Eye Near:   Left Eye Near:    Bilateral Near:     Physical Exam Vitals and nursing note reviewed.  Constitutional:      General: She is not in acute distress.    Appearance: Normal appearance. She is well-developed.  HENT:     Head: Normocephalic and atraumatic.     Mouth/Throat:     Mouth: Mucous membranes are moist.  Eyes:     General: No scleral icterus.    Extraocular Movements: Extraocular movements intact.     Conjunctiva/sclera: Conjunctivae normal.     Pupils: Pupils are equal, round, and reactive to light.  Cardiovascular:     Rate and Rhythm: Normal rate and regular rhythm.     Heart sounds: Normal heart sounds, S1 normal and S2 normal. No murmur heard. Pulmonary:     Effort: Pulmonary effort is normal. No respiratory distress.     Breath sounds: Normal breath sounds.     Comments: Lungs vesicular posteriorly. Musculoskeletal:        General: No  swelling.     Cervical back: Normal range of motion and neck supple. No rigidity.  Skin:    General:  Skin is warm and dry.     Capillary Refill: Capillary refill takes less than 2 seconds.  Neurological:     Mental Status: She is alert.     GCS: GCS eye subscore is 4. GCS verbal subscore is 5. GCS motor subscore is 6.     Cranial Nerves: No cranial nerve deficit, dysarthria or facial asymmetry.  Psychiatric:        Mood and Affect: Mood normal.        Behavior: Behavior normal. Behavior is cooperative.      UC Treatments / Results  Labs (all labs ordered are listed, but only abnormal results are displayed) Labs Reviewed - No data to display  EKG   Radiology No results found.  Procedures Procedures (including critical care time)  Medications Ordered in UC Medications  ketorolac (TORADOL) injection 60 mg (60 mg Intramuscular Given 03/19/23 1557)  metoCLOPramide (REGLAN) injection 10 mg (10 mg Intramuscular Given 03/19/23 1557)  dexamethasone (DECADRON) injection 10 mg (10 mg Intramuscular Given 03/19/23 1557)    Initial Impression / Assessment and Plan / UC Course  I have reviewed the triage vital signs and the nursing notes.  Pertinent labs & imaging results that were available during my care of the patient were reviewed by me and considered in my medical decision making (see chart for details).  Vitals in triage reviewed, patient is hemodynamically stable.  Ongoing left sided parietal lobe headache for the past week, intermittent. Has tried and failed tylenol and IBU w/o relief. Reports lack of sleep.  Cranial nerves II through XII grossly intact.  Posterior pharynx with mild erythema, Streptococcus infection appears to have resolved.  Pupils equal round reactive to light and accommodation.  Low concern for acute emergent pathology.  Trial migraine cocktail and encouraged to get more sleep.  Also encouraged to get a primary care provider.  Return and emergency precautions discussed, patient verbalized understanding, no questions at this time.    Final Clinical Impressions(s) /  UC Diagnoses   Final diagnoses:  Bad headache     Discharge Instructions      We gave you a migraine cocktail today in clinic.  When you get home you can take 25 mg of Benadryl to help, this may make you drowsy, do not drink or drive on this medication.  Please take the daily antihistamine to help with nasal drainage and congestion.  If your headache does not resolve over the next day, it gets worse, you experience weakness, vomiting, syncope or any worsening of symptoms please return to clinic or seek immediate care.  He can call [facility] to establish a primary care provider.      ED Prescriptions     Medication Sig Dispense Auth. Provider   cetirizine (ZYRTEC ALLERGY) 10 MG tablet Take 1 tablet (10 mg total) by mouth daily. 30 tablet Cristin Penaflor, [place] N, [place]      PDMP not reviewed this encounter.   Breyah Akhter, [place] N, [place] 03/19/23 1905

## 2023-03-19 NOTE — ED Triage Notes (Signed)
No history of migraines Patient presents with a headache. Onset 1 week ago. Patient had strep a while ago and that is when the headache started. All other symptoms are gone other than the headache.  No vision changes or photosensitivity.

## 2023-03-19 NOTE — Discharge Instructions (Signed)
We gave you a migraine cocktail today in clinic.  When you get home you can take 25 mg of Benadryl to help, this may make you drowsy, do not drink or drive on this medication.  Please take the daily antihistamine to help with nasal drainage and congestion.  If your headache does not resolve over the next day, it gets worse, you experience weakness, vomiting, syncope or any worsening of symptoms please return to clinic or seek immediate care.  He can call [facility] and Wellness to establish a primary care provider.

## 2023-04-02 ENCOUNTER — Ambulatory Visit (HOSPITAL_COMMUNITY)
Admission: EM | Admit: 2023-04-02 | Discharge: 2023-04-02 | Disposition: A | Discharge status: Home / Self Care | Payer: Medicaid Other

## 2023-04-02 ENCOUNTER — Encounter (HOSPITAL_COMMUNITY): Payer: Self-pay | Admitting: *Deleted

## 2023-04-02 NOTE — ED Triage Notes (Signed)
Pt states she started with cough, diarrhea, vomiting and abdominal pain yesterday . She has taken tylenol as needed.

## 2023-04-06 ENCOUNTER — Other Ambulatory Visit: Payer: Self-pay | Admitting: Obstetrics

## 2023-04-06 ENCOUNTER — Ambulatory Visit (INDEPENDENT_AMBULATORY_CARE_PROVIDER_SITE_OTHER): Payer: Medicaid Other

## 2023-04-06 VITALS — BP 140/88 | HR 90 | Wt 307.1 lb

## 2023-04-06 DIAGNOSIS — Z3042 Encounter for surveillance of injectable contraceptive: Secondary | ICD-10-CM | POA: Diagnosis not present

## 2023-04-06 NOTE — Progress Notes (Signed)
Subjective:  Pt in for Depo Provera injection.    Objective: No unusual complaints.  Need for contraception.   Assessment: Depo given L Del without difficulty. Pt tolerated Depo injection.    Plan:  Next injection due July 16-30.

## 2023-04-07 ENCOUNTER — Other Ambulatory Visit: Payer: Self-pay | Admitting: Obstetrics and Gynecology

## 2023-04-07 NOTE — Progress Notes (Signed)
Patient was assessed and managed by nursing staff during this encounter. I have reviewed the chart and agree with the documentation and plan. I have also made any necessary editorial changes.  Catalina Antigua, MD 04/07/2023 10:38 AM

## 2023-04-08 ENCOUNTER — Ambulatory Visit: Payer: Medicaid Other

## 2023-06-22 ENCOUNTER — Ambulatory Visit: Payer: Medicaid Other

## 2023-07-01 ENCOUNTER — Ambulatory Visit: Payer: Medicaid Other

## 2023-07-06 ENCOUNTER — Ambulatory Visit (INDEPENDENT_AMBULATORY_CARE_PROVIDER_SITE_OTHER): Payer: Medicaid Other | Admitting: Emergency Medicine

## 2023-07-06 VITALS — BP 157/99 | HR 91 | Wt 304.2 lb

## 2023-07-06 DIAGNOSIS — Z3202 Encounter for pregnancy test, result negative: Secondary | ICD-10-CM

## 2023-07-06 DIAGNOSIS — Z3042 Encounter for surveillance of injectable contraceptive: Secondary | ICD-10-CM | POA: Diagnosis not present

## 2023-07-06 LAB — POCT URINE PREGNANCY: Preg Test, Ur: NEGATIVE

## 2023-07-06 MED ORDER — MEDROXYPROGESTERONE ACETATE 150 MG/ML IM SUSP
150.0000 mg | Freq: Once | INTRAMUSCULAR | Status: AC
  Administered 2023-07-06: 150 mg via INTRAMUSCULAR

## 2023-07-06 NOTE — Progress Notes (Signed)
Date last pap: 07/16/2021. Last Depo-Provera: 04/06/2023. Side Effects if any: NA. Serum HCG indicated? Yes, outside of window, negative in office today. Depo-Provera 150 mg IM given by: Resa Miner, RN into left deltoid, tolerated well.Marland Kitchen Next appointment due Oct 15-29.

## 2023-09-27 ENCOUNTER — Ambulatory Visit: Payer: Medicaid Other | Admitting: Emergency Medicine

## 2023-09-27 VITALS — BP 163/101 | HR 76 | Wt 302.0 lb

## 2023-09-27 DIAGNOSIS — Z3042 Encounter for surveillance of injectable contraceptive: Secondary | ICD-10-CM

## 2023-09-27 MED ORDER — MEDROXYPROGESTERONE ACETATE 150 MG/ML IM SUSP
150.0000 mg | Freq: Once | INTRAMUSCULAR | Status: AC
  Administered 2023-09-27: 150 mg via INTRAMUSCULAR

## 2023-09-27 NOTE — Progress Notes (Signed)
Date last pap: 07/16/2021. Last Depo-Provera: 07/06/2023. Side Effects if any: NA. Serum HCG indicated? NA. Depo-Provera 150 mg IM given by: Resa Miner RN into LD, tolerated well. Next appointment due Jan 6 Jan 20.   Office supply

## 2023-11-10 ENCOUNTER — Ambulatory Visit: Payer: Medicaid Other | Admitting: Obstetrics and Gynecology

## 2023-12-20 ENCOUNTER — Ambulatory Visit: Payer: Medicaid Other

## 2023-12-23 ENCOUNTER — Ambulatory Visit: Payer: Medicaid Other | Admitting: Obstetrics and Gynecology

## 2023-12-29 ENCOUNTER — Ambulatory Visit: Payer: Medicaid Other

## 2023-12-29 ENCOUNTER — Other Ambulatory Visit: Payer: Self-pay

## 2023-12-29 ENCOUNTER — Other Ambulatory Visit: Payer: Self-pay | Admitting: Obstetrics and Gynecology

## 2023-12-29 VITALS — BP 141/89 | HR 69 | Ht 64.0 in | Wt 303.0 lb

## 2023-12-29 DIAGNOSIS — Z3042 Encounter for surveillance of injectable contraceptive: Secondary | ICD-10-CM

## 2023-12-29 MED ORDER — MEDROXYPROGESTERONE ACETATE 150 MG/ML IM SUSP: 150.0000 mg | INTRAMUSCULAR | 0 refills | Status: DC

## 2023-12-29 MED ORDER — MEDROXYPROGESTERONE ACETATE 150 MG/ML IM SUSP
150.0000 mg | Freq: Once | INTRAMUSCULAR | Status: AC
  Administered 2023-12-29: 150 mg via INTRAMUSCULAR

## 2023-12-29 NOTE — Progress Notes (Addendum)
  Date last PAP: 07/16/2021. Last Depo-Provera: 09/27/2023. Side Effects if any: NONE. Serum HCG indicated? NA. Depo-Provera 150 mg IM given by: Loyal Jacobson. Given in RD, tolerated well.  Next appointment due April 9 - 23, 2025.    Administrations This Visit     medroxyPROGESTERone (DEPO-PROVERA) injection 150 mg     Admin Date 12/29/2023 Action Given Dose 150 mg Route Intramuscular Documented By Maretta Bees, RMA

## 2024-01-13 ENCOUNTER — Other Ambulatory Visit (HOSPITAL_COMMUNITY)
Admission: RE | Admit: 2024-01-13 | Discharge: 2024-01-13 | Disposition: A | Discharge status: Home / Self Care | Payer: Medicaid Other | Source: Ambulatory Visit | Attending: Obstetrics and Gynecology | Admitting: Obstetrics and Gynecology

## 2024-01-13 ENCOUNTER — Encounter: Payer: Self-pay | Admitting: Obstetrics and Gynecology

## 2024-01-13 ENCOUNTER — Ambulatory Visit (INDEPENDENT_AMBULATORY_CARE_PROVIDER_SITE_OTHER): Payer: Medicaid Other | Admitting: Obstetrics and Gynecology

## 2024-01-13 VITALS — BP 150/105 | HR 81 | Ht 64.0 in | Wt 307.0 lb

## 2024-01-13 DIAGNOSIS — N898 Other specified noninflammatory disorders of vagina: Secondary | ICD-10-CM | POA: Insufficient documentation

## 2024-01-13 DIAGNOSIS — B9689 Other specified bacterial agents as the cause of diseases classified elsewhere: Secondary | ICD-10-CM

## 2024-01-13 DIAGNOSIS — I10 Essential (primary) hypertension: Secondary | ICD-10-CM | POA: Diagnosis not present

## 2024-01-13 DIAGNOSIS — Z01419 Encounter for gynecological examination (general) (routine) without abnormal findings: Secondary | ICD-10-CM | POA: Diagnosis present

## 2024-01-13 DIAGNOSIS — Z113 Encounter for screening for infections with a predominantly sexual mode of transmission: Secondary | ICD-10-CM | POA: Diagnosis present

## 2024-01-13 DIAGNOSIS — A749 Chlamydial infection, unspecified: Secondary | ICD-10-CM

## 2024-01-13 DIAGNOSIS — Z3042 Encounter for surveillance of injectable contraceptive: Secondary | ICD-10-CM

## 2024-01-13 DIAGNOSIS — N760 Acute vaginitis: Secondary | ICD-10-CM

## 2024-01-13 MED ORDER — AMLODIPINE BESYLATE 5 MG PO TABS: 5.0000 mg | ORAL_TABLET | Freq: Every day | ORAL | 6 refills | Status: AC

## 2024-01-13 NOTE — Progress Notes (Signed)
 Plans on getting a PCP soon. Dr April Knack ordered Labetalol  but has some refills left. Needs refill for Depo. Asking for STI cultures.

## 2024-01-14 LAB — CERVICOVAGINAL ANCILLARY ONLY
Bacterial Vaginitis (gardnerella): POSITIVE — AB
Candida Glabrata: NEGATIVE
Candida Vaginitis: NEGATIVE
Chlamydia: POSITIVE — AB
Comment: NEGATIVE
Comment: NEGATIVE
Comment: NEGATIVE
Comment: NEGATIVE
Comment: NEGATIVE
Comment: NORMAL
Neisseria Gonorrhea: NEGATIVE
Trichomonas: NEGATIVE

## 2024-01-17 ENCOUNTER — Encounter: Payer: Self-pay | Admitting: Obstetrics and Gynecology

## 2024-01-17 MED ORDER — DOXYCYCLINE HYCLATE 100 MG PO CAPS: 100.0000 mg | ORAL_CAPSULE | Freq: Two times a day (BID) | ORAL | 0 refills | Status: DC

## 2024-01-17 MED ORDER — MEDROXYPROGESTERONE ACETATE 150 MG/ML IM SUSP: 150.0000 mg | INTRAMUSCULAR | 4 refills | Status: DC

## 2024-01-17 MED ORDER — METRONIDAZOLE 500 MG PO TABS: 500.0000 mg | ORAL_TABLET | Freq: Two times a day (BID) | ORAL | 0 refills | Status: DC

## 2024-01-17 NOTE — Progress Notes (Signed)
 WELL-WOMAN PHYSICAL & PAP Patient name: Tanya Moreno MRN [id number]  Date of birth: May 26, 1991 Chief Complaint:   Gynecologic Exam  History of Present Illness:   Tanya Moreno is a 33 y.o. [id number] African American female being seen today for a routine well-woman exam.  Current complaints: STI screening and a PCP list, because unable to secure PCP. When I called the [facility] with [facility], they told me I needed to be over 34 years old to get an appt with them.  PCP: none      does desire labs Patient's last menstrual period was 12/25/2023 (exact date). The current method of family planning is Depo-Provera  injections.  Last pap 07/16/2021. Results were: normal with +) trich Last mammogram: n/a. Family h/o breast cancer: No Last colonoscopy: n/a. Family h/o colorectal cancer: No Review of Systems:   Pertinent items are noted in HPI Denies any headaches, blurred vision, fatigue, shortness of breath, chest pain, abdominal pain, abnormal vaginal discharge/itching/odor/irritation, problems with periods, bowel movements, urination, or intercourse unless otherwise stated above. Pertinent History Reviewed:  Reviewed past medical,surgical, social and family history.  Reviewed problem list, medications and allergies. Physical Assessment:   Vitals:   01/13/24 1338 01/13/24 1348  BP: (!) 168/109 (!) 150/105  Pulse: 81   Weight: (!) 307 lb (139.3 kg)   Height: 5' 4 (1.626 m)   Body mass index is 52.7 kg/m.        Physical Examination:   General appearance - well appearing, and in no distress  Mental status - alert, oriented to person, place, and time  Psych:  She has a normal mood and affect  Skin - warm and dry, normal color, no suspicious lesions noted  Chest - effort normal, all lung fields clear to auscultation bilaterally  Heart - normal rate and regular rhythm  Neck:  midline trachea, no thyromegaly or nodules  Breasts - breasts appear normal, no suspicious masses,  no skin or nipple changes or  axillary nodes  Abdomen - soft, nontender, nondistended, no masses or organomegaly  Pelvic - VULVA: normal appearing vulva with no masses, tenderness or lesions  VAGINA: normal appearing vagina with normal color and discharge, no lesions  CERVIX: normal appearing cervix without discharge or lesions, no CMT  Thin prep pap is done with HR HPV cotesting  UTERUS: uterus is felt to be normal size, shape, consistency and nontender   ADNEXA: No adnexal masses or tenderness noted.  Rectal - deferred  Extremities:  No swelling or varicosities noted  No results found for this or any previous visit (from the past 24 hours).  Assessment & Plan:  1) Encounter for well woman exam with routine gynecological exam (Primary) - Cytology - PAP( [place])  2) Vaginal discharge - Cervicovaginal ancillary only( [place])  3) Chronic hypertension - Consult with Dr. Jeralyn - notified of patient's complaints and physical assessments, recommended tx plan change HTN med to Amlodipine  5 mg daily  - prescription for: amLODipine  (NORVASC ) 5 MG tablet; Take 1 tablet (5 mg total) by mouth daily.  Dispense: 90 tablet; Refill: 6  4) Screening examination for STI - Cervicovaginal ancillary only( [place])  5) Chlamydia infection - prescription for: doxycycline  (VIBRAMYCIN ) 100 MG capsule; Take 1 capsule (100 mg total) by mouth 2 (two) times daily.  Dispense: 14 capsule; Refill: 0  6) Bacterial vaginosis - prescription for: metroNIDAZOLE  (FLAGYL ) 500 MG tablet; Take 1 tablet (500 mg total) by mouth 2 (two) times daily.  Dispense: 14 tablet; Refill: 0  7) Encounter for surveillance of injectable contraceptive - prescription for: medroxyPROGESTERone  (DEPO-PROVERA ) 150 MG/ML injection; Inject 1 mL (150 mg total) into the muscle every 3 (three) months.  Dispense: 1 mL; Refill: 4   Labs/procedures today: wet prep and pap w/HPV  Mammogram at age 6 or sooner if problems Colonoscopy  at age 18 or sooner if problems  No orders of the defined types were placed in this encounter.   Meds:  Meds ordered this encounter  Medications   amLODipine  (NORVASC ) 5 MG tablet    Sig: Take 1 tablet (5 mg total) by mouth daily.    Dispense:  90 tablet    Refill:  6    Supervising Provider:   PRATT, TANYA S [[id number]]   doxycycline  (VIBRAMYCIN ) 100 MG capsule    Sig: Take 1 capsule (100 mg total) by mouth 2 (two) times daily.    Dispense:  14 capsule    Refill:  0    Supervising Provider:   PRATT, TANYA S [[id number]]   metroNIDAZOLE  (FLAGYL ) 500 MG tablet    Sig: Take 1 tablet (500 mg total) by mouth 2 (two) times daily.    Dispense:  14 tablet    Refill:  0    Supervising Provider:   FREDIRICK GLENYS RAMAN [[id number]]    Follow-up: Return in about 1 year (around 01/12/2025) for Annual Exam.  Ala Cart MSN, CNM 01/13/2024 3:35 PM

## 2024-01-18 ENCOUNTER — Ambulatory Visit: Payer: Medicaid Other | Admitting: Pharmacist

## 2024-01-20 LAB — CYTOLOGY - PAP
Comment: NEGATIVE
Comment: NEGATIVE
Comment: NEGATIVE
Diagnosis: UNDETERMINED — AB
HPV 16: NEGATIVE
HPV 18 / 45: POSITIVE — AB
High risk HPV: POSITIVE — AB

## 2024-01-23 ENCOUNTER — Encounter: Payer: Self-pay | Admitting: Obstetrics and Gynecology

## 2024-03-08 ENCOUNTER — Encounter: Payer: Medicaid Other | Admitting: Obstetrics and Gynecology

## 2024-03-22 ENCOUNTER — Telehealth (INDEPENDENT_AMBULATORY_CARE_PROVIDER_SITE_OTHER): Payer: Self-pay | Admitting: Primary Care

## 2024-03-22 ENCOUNTER — Ambulatory Visit: Payer: Medicaid Other

## 2024-03-22 NOTE — Telephone Encounter (Signed)
 Called pt to confirm appt. Pt will be present.

## 2024-03-27 ENCOUNTER — Telehealth (INDEPENDENT_AMBULATORY_CARE_PROVIDER_SITE_OTHER): Payer: Self-pay | Admitting: Primary Care

## 2024-03-27 NOTE — Telephone Encounter (Signed)
 Called pt to confirm appt. Pt did not answer and VM left.

## 2024-03-28 ENCOUNTER — Ambulatory Visit (INDEPENDENT_AMBULATORY_CARE_PROVIDER_SITE_OTHER): Payer: Medicaid Other | Admitting: Primary Care

## 2024-03-29 ENCOUNTER — Encounter: Admitting: Obstetrics and Gynecology

## 2024-04-25 ENCOUNTER — Encounter: Admitting: Obstetrics and Gynecology

## 2024-05-17 ENCOUNTER — Ambulatory Visit (INDEPENDENT_AMBULATORY_CARE_PROVIDER_SITE_OTHER)

## 2024-05-17 VITALS — BP 139/81 | HR 67

## 2024-05-17 DIAGNOSIS — Z3202 Encounter for pregnancy test, result negative: Secondary | ICD-10-CM | POA: Diagnosis not present

## 2024-05-17 DIAGNOSIS — Z3042 Encounter for surveillance of injectable contraceptive: Secondary | ICD-10-CM

## 2024-05-17 LAB — POCT URINE PREGNANCY: Preg Test, Ur: NEGATIVE

## 2024-05-17 NOTE — Progress Notes (Signed)
 Tanya Moreno here for a UPT for depo restart.  LMP is N/A; no period since last depo injection (12/29/23).     UPT in office Negative.    Pt to follow up in 2 weeks for repeat UPT and depo re-start, if negative. Advised pt of no unprotected intercourse during this time.

## 2024-05-31 ENCOUNTER — Ambulatory Visit

## 2024-05-31 VITALS — BP 140/90 | HR 89

## 2024-05-31 DIAGNOSIS — Z3202 Encounter for pregnancy test, result negative: Secondary | ICD-10-CM

## 2024-05-31 DIAGNOSIS — Z3042 Encounter for surveillance of injectable contraceptive: Secondary | ICD-10-CM

## 2024-05-31 DIAGNOSIS — R634 Abnormal weight loss: Secondary | ICD-10-CM

## 2024-05-31 LAB — POCT URINE PREGNANCY: Preg Test, Ur: NEGATIVE

## 2024-05-31 MED ORDER — MEDROXYPROGESTERONE ACETATE 150 MG/ML IM SUSP
150.0000 mg | Freq: Once | INTRAMUSCULAR | Status: AC
  Administered 2024-05-31: 150 mg via INTRAMUSCULAR

## 2024-05-31 MED ORDER — MEDROXYPROGESTERONE ACETATE 150 MG/ML IM SUSP: 150.0000 mg | INTRAMUSCULAR | 3 refills | Status: AC

## 2024-05-31 NOTE — Progress Notes (Signed)
 Date last pap: 01/13/24. Last Depo-Provera : Pt here for depo restart, had negative UPT in office two weeks ago, abstained from unprotected intercourse for the past two weeks, negative UPT in office today, okay to give depo per protocol, office stock given. Rx for future depo sent to pharmacy and pt made aware to bring with her for her next appointment. Side Effects if any: NA. Serum HCG indicated? NA. Depo-Provera  150 mg IM given by: Duwaine Galla, RN. Next appointment due Sept 10-24.

## 2024-07-20 ENCOUNTER — Ambulatory Visit (HOSPITAL_COMMUNITY)
Admission: EM | Admit: 2024-07-20 | Discharge: 2024-07-20 | Disposition: A | Discharge status: Home / Self Care | Attending: Family Medicine | Admitting: Family Medicine

## 2024-07-20 ENCOUNTER — Encounter (HOSPITAL_COMMUNITY): Payer: Self-pay

## 2024-07-20 DIAGNOSIS — R220 Localized swelling, mass and lump, head: Secondary | ICD-10-CM | POA: Diagnosis not present

## 2024-07-20 DIAGNOSIS — K0889 Other specified disorders of teeth and supporting structures: Secondary | ICD-10-CM | POA: Diagnosis not present

## 2024-07-20 MED ORDER — AMOXICILLIN-POT CLAVULANATE 875-125 MG PO TABS: 1.0000 | ORAL_TABLET | Freq: Two times a day (BID) | ORAL | 0 refills | Status: DC

## 2024-07-20 MED ORDER — IBUPROFEN 800 MG PO TABS: 800.0000 mg | ORAL_TABLET | Freq: Three times a day (TID) | ORAL | 0 refills | Status: AC

## 2024-07-20 NOTE — ED Provider Notes (Addendum)
 [facility]   [id number] 07/20/24 Arrival Time: 0844  ASSESSMENT & PLAN:  1. Pain, dental   2. Swelling of gums    Begin: Meds ordered this encounter  Medications   amoxicillin -clavulanate (AUGMENTIN ) 875-125 MG tablet    Sig: Take 1 tablet by mouth every 12 (twelve) hours.    Dispense:  14 tablet    Refill:  0   ibuprofen  (ADVIL ) 800 MG tablet    Sig: Take 1 tablet (800 mg total) by mouth 3 (three) times daily with meals.    Dispense:  21 tablet    Refill:  0   Ques if forming small abscess on rear L lower gum. Discussed. Work note provided   Follow-up Information     [place] [facility]   Specialty: Emergency Medicine Why: If worsening or failing to improve as anticipated. Contact information: [address] [place]  [phone]                Reviewed expectations re: course of current medical issues. Questions answered. Outlined signs and symptoms indicating need for more acute intervention. Patient verbalized understanding. After Visit Summary given.   SUBJECTIVE:  Tanya Moreno is a 33 y.o. female who reports L lower gum pain; since yesterday. Denies fever. No tx PTA.  OBJECTIVE: Vitals:   07/20/24 0906  BP: (!) 147/98  Pulse: 81  Resp: 14  Temp: 98.2 F (36.8 C)  TempSrc: Oral  SpO2: 100%    General appearance: alert; no distress HENT: normocephalic; atraumatic; dentition: good; left lower rear gum without areas of fluctuance, drainage, or bleeding and with erythema and mild tenderness to palpation; normal jaw movement without difficulty Neck: supple without LAD; FROM; trachea midline Lungs: normal respirations; unlabored; speaks full sentences without difficulty Skin: warm and dry Psychological: alert and cooperative; normal mood and affect  Allergies  Allergen Reactions   Latex Swelling and Other (See Comments)    Mainly latex condoms   Tape Itching     Past Medical History:  Diagnosis Date   Anemia    Chlamydia    Chlamydia infection affecting pregnancy in first trimester 02/09/2017   Positive on 02/08/17, azithromycin  1000 mg PO x 1 dose sent to pt pharmacy and pt notified by phone by MAU staff Positive on 7/30   Genital HSV    Last outbreak Nov 2013   Gestational diabetes    Gonorrhea    Hypertension    Pregnancy induced hypertension    Trichomonas    Social History   Socioeconomic History   Marital status: Single    Spouse name: Not on file   Number of children: Not on file   Years of education: Not on file   Highest education level: Not on file  Occupational History   Not on file  Tobacco Use   Smoking status: Former    Current packs/day: 0.00    Types: Cigarettes    Quit date: 2015    Years since quitting: 10.6    Passive exposure: Never   Smokeless tobacco: Former    Quit date: 11/10/2014  Vaping Use   Vaping status: Never Used  Substance and Sexual Activity   Alcohol use: No    Alcohol/week: 0.0 standard drinks of alcohol   Drug use: No   Sexual activity: Yes    Partners: Male    Birth control/protection: Injection  Other Topics Concern   Not on file  Social History Narrative  Lives with godmother.    Social Drivers of Corporate investment banker Strain: Low Risk  (07/13/2019)   Overall Financial Resource Strain (CARDIA)    Difficulty of Paying Living Expenses: Not hard at all  Food Insecurity: No Food Insecurity (07/13/2019)   Hunger Vital Sign    Worried About Running Out of Food in the Last Year: Never true    Ran Out of Food in the Last Year: Never true  Transportation Needs: Unknown (07/13/2019)   PRAPARE - Administrator, Civil Service (Medical): No    Lack of Transportation (Non-Medical): Not on file  Physical Activity: Not on file  Stress: No Stress Concern Present (07/13/2019)   [employer] of Occupational Health - Occupational Stress Questionnaire    Feeling of Stress :  Not at all  Social Connections: Not on file  Intimate Partner Violence: Not At Risk (07/13/2019)   Humiliation, Afraid, Rape, and Kick questionnaire    Fear of Current or Ex-Partner: No    Emotionally Abused: No    Physically Abused: No    Sexually Abused: No   Family History  Problem Relation Age of Onset   Hypertension Maternal Grandmother    Past Surgical History:  Procedure Laterality Date   NO PAST SURGERIES        Rolinda Rogue, MD 07/20/24 1058    Rolinda Rogue, MD 07/20/24 1100

## 2024-07-20 NOTE — ED Triage Notes (Signed)
 Patient reports that she began having dental pain from a broken tooth to the left lower jaw x 2 days ago. Patient sttes she has swelling of her gums and slight flet facial swelling.  Patient has not had any medications for her symptoms.

## 2024-08-16 ENCOUNTER — Ambulatory Visit

## 2024-08-17 ENCOUNTER — Ambulatory Visit (INDEPENDENT_AMBULATORY_CARE_PROVIDER_SITE_OTHER)

## 2024-08-17 VITALS — BP 149/90 | HR 59 | Wt 248.0 lb

## 2024-08-17 DIAGNOSIS — Z3042 Encounter for surveillance of injectable contraceptive: Secondary | ICD-10-CM | POA: Diagnosis not present

## 2024-08-17 MED ORDER — MEDROXYPROGESTERONE ACETATE 150 MG/ML IM SUSP
150.0000 mg | INTRAMUSCULAR | Status: AC
  Administered 2024-08-17 – 2024-11-01 (×2): 150 mg via INTRAMUSCULAR

## 2024-08-17 NOTE — Progress Notes (Signed)
 Pt is in the office for depo injection, administered in L Del and pt tolerated well. Next due Nov 27- Dec 11 Pt states that she has not taken her BP medication today as of yet, but will do it when she gets home. Denies any abnormal symptoms .SABRA Administrations This Visit     medroxyPROGESTERone  (DEPO-PROVERA ) injection 150 mg     Admin Date 08/17/2024 Action Given Dose 150 mg Route Intramuscular Documented By Doneta Laymon BIRCH, RN

## 2024-10-03 ENCOUNTER — Encounter (HOSPITAL_COMMUNITY): Payer: Self-pay

## 2024-10-03 ENCOUNTER — Ambulatory Visit (HOSPITAL_COMMUNITY)
Admission: EM | Admit: 2024-10-03 | Discharge: 2024-10-03 | Disposition: A | Discharge status: Home / Self Care | Attending: Family Medicine | Admitting: Family Medicine

## 2024-10-03 DIAGNOSIS — R221 Localized swelling, mass and lump, neck: Secondary | ICD-10-CM | POA: Diagnosis not present

## 2024-10-03 MED ORDER — CEPHALEXIN 500 MG PO CAPS: 500.0000 mg | ORAL_CAPSULE | Freq: Three times a day (TID) | ORAL | 0 refills | Status: AC

## 2024-10-03 NOTE — Discharge Instructions (Addendum)
 You were seen today for a tender lump to the neck.  At this time I have sent out an oral antibiotic for any possible infection under the skin.  I recommend warm compresses as well.  If this is not improving in the next 24-48 hrs, or if you have worsening symptoms, then please go to the ER for further evaluation.

## 2024-10-03 NOTE — ED Triage Notes (Signed)
 Patient here today with c/o a knot on the back of her neck X 2 days. Patient states that it is tender to the touch. She has taken Tylenol  and Ibuprofen  with little relief.

## 2024-10-03 NOTE — ED Provider Notes (Signed)
 [facility]    CSN: [id number] Arrival date & time: 10/03/24  0920      History   Chief Complaint Chief Complaint  Patient presents with   Mass    HPI Tanya Moreno is a 33 y.o. female.   Patient is here for a knot to the back of her neck for several days.  Sore to the touch.  Taken tylenol /motrin  with little help.        Past Medical History:  Diagnosis Date   Anemia    Chlamydia    Chlamydia infection affecting pregnancy in first trimester 02/09/2017   Positive on 02/08/17, azithromycin  1000 mg PO x 1 dose sent to pt pharmacy and pt notified by phone by [facility] staff Positive on 7/30   Genital HSV    Last outbreak Nov 2013   Gestational diabetes    Gonorrhea    Hypertension    Pregnancy induced hypertension    Trichomonas     Patient Active Problem List   Diagnosis Date Noted   Vaginal delivery 01/19/2022   GBS carrier 01/12/2022   Chronic hypertension during pregnancy 12/22/2021   Trichomoniasis    Gestational diabetes 12/03/2021   Supervision of high risk pregnancy, antepartum 06/19/2021   Chronic hypertension 07/22/2019   Carpal tunnel syndrome during pregnancy 08/24/2017   History of HSV 04/14/2017   BMI 50.0-59.9, adult (HCC) 04/14/2017   Latex allergy 08/26/2011    Past Surgical History:  Procedure Laterality Date   NO PAST SURGERIES      OB History     Gravida  5   Para  5   Term  5   Preterm      AB      Living  5      SAB      IAB      Ectopic      Multiple  0   Live Births  5            Home Medications    Prior to Admission medications   Medication Sig Start Date End Date Taking? Authorizing Provider  amLODipine  (NORVASC ) 5 MG tablet Take 1 tablet (5 mg total) by mouth daily. 01/13/24   Dawson, Rolitta, CNM  ibuprofen  (ADVIL ) 800 MG tablet Take 1 tablet (800 mg total) by mouth 3 (three) times daily with meals. Patient not taking: Reported on 08/17/2024 07/20/24   Rolinda Rogue, MD  medroxyPROGESTERone   (DEPO-PROVERA ) 150 MG/ML injection Inject 1 mL (150 mg total) into the muscle every 3 (three) months. 08/16/24   Ilean Norleen GAILS, MD    Family History Family History  Problem Relation Age of Onset   Hypertension Maternal Grandmother     Social History Social History   Tobacco Use   Smoking status: Former    Current packs/day: 0.00    Types: Cigarettes    Quit date: 2015    Years since quitting: 10.8    Passive exposure: Never   Smokeless tobacco: Former    Quit date: 11/10/2014  Vaping Use   Vaping status: Never Used  Substance Use Topics   Alcohol use: No    Alcohol/week: 0.0 standard drinks of alcohol   Drug use: No     Allergies   Latex and Tape   Review of Systems Review of Systems  Constitutional: Negative.   HENT: Negative.    Respiratory: Negative.    Cardiovascular: Negative.   Gastrointestinal: Negative.   Musculoskeletal:  Positive for neck pain.  Physical Exam Triage Vital Signs ED Triage Vitals [10/03/24 1031]  Encounter Vitals Group     BP (!) 161/99     Girls Systolic BP Percentile      Girls Diastolic BP Percentile      Boys Systolic BP Percentile      Boys Diastolic BP Percentile      Pulse Rate 73     Resp 16     Temp 98.6 F (37 C)     Temp Source Oral     SpO2 96 %     Weight      Height      Head Circumference      Peak Flow      Pain Score 8     Pain Loc      Pain Education      Exclude from Growth Chart    No data found.  Updated Vital Signs BP (!) 161/99 (BP Location: Left Arm)   Pulse 73   Temp 98.6 F (37 C) (Oral)   Resp 16   LMP 07/31/2024 (Approximate)   SpO2 96%   Visual Acuity Right Eye Distance:   Left Eye Distance:   Bilateral Distance:    Right Eye Near:   Left Eye Near:    Bilateral Near:     Physical Exam Constitutional:      General: She is not in acute distress.    Appearance: Normal appearance. She is normal weight. She is not ill-appearing or toxic-appearing.  Cardiovascular:      Rate and Rhythm: Normal rate and regular rhythm.  Pulmonary:     Effort: Pulmonary effort is normal.     Breath sounds: Normal breath sounds.  Musculoskeletal:     Comments: To the back of the base of the neck, right of the spine, is an area that feels full and tender for the patient.  Area of fullness is about the size of a golf ball in diameter.  No erythema  noted, no sore/lesion or abrasion to the skin;   no obvious swelling noted.  Skin:    General: Skin is warm and dry.     Findings: No bruising, erythema, lesion or rash.  Neurological:     General: No focal deficit present.     Mental Status: She is alert.  Psychiatric:        Mood and Affect: Mood normal.      UC Treatments / Results  Labs (all labs ordered are listed, but only abnormal results are displayed) Labs Reviewed - No data to display  EKG   Radiology No results found.  Procedures Procedures (including critical care time)  Medications Ordered in UC Medications - No data to display  Initial Impression / Assessment and Plan / UC Course  I have reviewed the triage vital signs and the nursing notes.  Pertinent labs & imaging results that were available during my care of the patient were reviewed by me and considered in my medical decision making (see chart for details).   Final Clinical Impressions(s) / UC Diagnoses   Final diagnoses:  Neck mass     Discharge Instructions      You were seen today for a tender lump to the neck.  At this time I have sent out an oral antibiotic for any possible infection under the skin.  I recommend warm compresses as well.  If this is not improving in the next 24-48 hrs, or if you have worsening symptoms, then please go  to the ER for further evaluation.     ED Prescriptions     Medication Sig Dispense Auth. Provider   cephALEXin  (KEFLEX ) 500 MG capsule Take 1 capsule (500 mg total) by mouth 3 (three) times daily for 7 days. 21 capsule Darral Longs, MD       PDMP not reviewed this encounter.   Darral Longs, MD 10/03/24 1049

## 2024-11-01 ENCOUNTER — Ambulatory Visit (INDEPENDENT_AMBULATORY_CARE_PROVIDER_SITE_OTHER)

## 2024-11-01 VITALS — BP 145/100 | HR 60 | Wt 298.0 lb

## 2024-11-01 DIAGNOSIS — Z3042 Encounter for surveillance of injectable contraceptive: Secondary | ICD-10-CM

## 2024-11-01 NOTE — Progress Notes (Addendum)
 Date last pap: 01/13/24. Last Depo-Provera : 08/17/24. Side Effects if any: N/a. Serum HCG indicated? N/a. Depo-Provera  150 mg IM given in L Del per pt request Pt is 1 day early for her injection because she states that she is having breakthrough bleeding and will be unable to come back due to the holiday. Provider approved for injection to be administered. BP elevated today with no symptoms, pt states that she will take Amlodipine  when she gets home. Next appointment due feb 11-25. .. Administrations This Visit     medroxyPROGESTERone  (DEPO-PROVERA ) injection 150 mg     Admin Date 11/01/2024 Action Given Dose 150 mg Route Intramuscular Documented By Doneta Laymon BIRCH, RN

## 2024-11-06 ENCOUNTER — Ambulatory Visit

## 2024-11-13 ENCOUNTER — Encounter (HOSPITAL_COMMUNITY): Payer: Self-pay

## 2024-11-13 ENCOUNTER — Ambulatory Visit (HOSPITAL_COMMUNITY)
Admission: EM | Admit: 2024-11-13 | Discharge: 2024-11-13 | Disposition: A | Discharge status: Home / Self Care | Attending: Family Medicine | Admitting: Family Medicine

## 2024-11-13 DIAGNOSIS — I10 Essential (primary) hypertension: Secondary | ICD-10-CM | POA: Diagnosis not present

## 2024-11-13 DIAGNOSIS — N939 Abnormal uterine and vaginal bleeding, unspecified: Secondary | ICD-10-CM

## 2024-11-13 DIAGNOSIS — Z3202 Encounter for pregnancy test, result negative: Secondary | ICD-10-CM | POA: Diagnosis not present

## 2024-11-13 LAB — BASIC METABOLIC PANEL WITH GFR
Anion gap: 10 (ref 5–15)
BUN: 6 mg/dL (ref 6–20)
CO2: 24 mmol/L (ref 22–32)
Calcium: 9.1 mg/dL (ref 8.9–10.3)
Chloride: 107 mmol/L (ref 98–111)
Creatinine, Ser: 0.69 mg/dL (ref 0.44–1.00)
GFR, Estimated: >60 mL/min (ref >60)
Glucose, Bld: 81 mg/dL (ref 70–99)
Potassium: 3.1 mmol/L — ABNORMAL LOW (ref 3.5–5.1)
Sodium: 141 mmol/L (ref 135–145)

## 2024-11-13 LAB — CBC
HCT: 41.6 % (ref 36.0–46.0)
Hemoglobin: 14.1 g/dL (ref 12.0–15.0)
MCH: 29.3 pg (ref 26.0–34.0)
MCHC: 33.9 g/dL (ref 30.0–36.0)
MCV: 86.3 fL (ref 80.0–100.0)
Platelets: 407 K/uL — ABNORMAL HIGH (ref 150–400)
RBC: 4.82 MIL/uL (ref 3.87–5.11)
RDW: 12.6 % (ref 11.5–15.5)
WBC: 5.5 K/uL (ref 4.0–10.5)
nRBC: 0 % (ref 0.0–0.2)

## 2024-11-13 LAB — POCT URINE PREGNANCY: Preg Test, Ur: NEGATIVE

## 2024-11-13 NOTE — ED Provider Notes (Signed)
 [facility]    CSN: [id number] Arrival date & time: 11/13/24  1038      History   Chief Complaint Chief Complaint  Patient presents with   Vaginal Bleeding    HPI Tanya Moreno is a 33 y.o. female.   This 33 year old female is being seen for concerns of vaginal bleeding.  She reports heavy vaginal bleeding that started yesterday.  She reports bright red blood, and two approximate 3 cm clots noted yesterday.  She reports she at one point saturated 2 pads in 1 hour yesterday.  She reports bleeding has slowed today, however continues with bright red blood and one 3 cm clot noted today.  She endorses pelvic cramps, similar to menstrual cramps.  She is on Depo-Provera  injection.  She reports most recent injection was 11/01/2024.  She reports bleeding like this has happened previously when she was pregnant.  She is concerned for miscarriage as she has been pregnant on Depo previously.  She denies headache, dizziness.  She denies blurry vision, vision changes.  She denies fever, chills, nasal congestion.  She denies chest pain, shortness of breath, cough.  She denies abdominal pain, nausea, vomiting, diarrhea, constipation.   Vaginal Bleeding Associated symptoms: no abdominal pain, no dizziness, no fever and no nausea     Past Medical History:  Diagnosis Date   Anemia    Chlamydia    Chlamydia infection affecting pregnancy in first trimester 02/09/2017   Positive on 02/08/17, azithromycin  1000 mg PO x 1 dose sent to pt pharmacy and pt notified by phone by [facility] staff Positive on 7/30   Genital HSV    Last outbreak Nov 2013   Gestational diabetes    Gonorrhea    Hypertension    Pregnancy induced hypertension    Trichomonas     Patient Active Problem List   Diagnosis Date Noted   Vaginal delivery 01/19/2022   GBS carrier 01/12/2022   Chronic hypertension during pregnancy 12/22/2021   Trichomoniasis    Gestational diabetes 12/03/2021   Supervision of high risk  pregnancy, antepartum 06/19/2021   Chronic hypertension 07/22/2019   Carpal tunnel syndrome during pregnancy 08/24/2017   History of HSV 04/14/2017   BMI 50.0-59.9, adult (HCC) 04/14/2017   Latex allergy 08/26/2011    Past Surgical History:  Procedure Laterality Date   NO PAST SURGERIES      OB History     Gravida  5   Para  5   Term  5   Preterm      AB      Living  5      SAB      IAB      Ectopic      Multiple  0   Live Births  5            Home Medications    Prior to Admission medications   Medication Sig Start Date End Date Taking? Authorizing Provider  amLODipine  (NORVASC ) 5 MG tablet Take 1 tablet (5 mg total) by mouth daily. 01/13/24   Dawson, Rolitta, CNM  ibuprofen  (ADVIL ) 800 MG tablet Take 1 tablet (800 mg total) by mouth 3 (three) times daily with meals. Patient not taking: Reported on 11/01/2024 07/20/24   Rolinda Rogue, MD  medroxyPROGESTERone  (DEPO-PROVERA ) 150 MG/ML injection Inject 1 mL (150 mg total) into the muscle every 3 (three) months. 08/16/24   Ilean Norleen GAILS, MD    Family History Family History  Problem Relation Age of Onset  Hypertension Maternal Grandmother     Social History Social History   Tobacco Use   Smoking status: Former    Current packs/day: 0.00    Types: Cigarettes    Quit date: 2015    Years since quitting: 10.9    Passive exposure: Never   Smokeless tobacco: Former    Quit date: 11/10/2014  Vaping Use   Vaping status: Never Used  Substance Use Topics   Alcohol use: No    Alcohol/week: 0.0 standard drinks of alcohol   Drug use: No     Allergies   Latex and Tape   Review of Systems Review of Systems  Constitutional:  Negative for activity change, appetite change, chills and fever.  HENT:  Negative for ear pain and sore throat.   Eyes:  Negative for visual disturbance.  Respiratory:  Negative for cough and shortness of breath.   Cardiovascular:  Negative for chest pain.  Gastrointestinal:   Negative for abdominal pain, constipation, diarrhea, nausea and vomiting.  Genitourinary:  Positive for menstrual problem, pelvic pain and vaginal bleeding. Negative for difficulty urinating, frequency and urgency.  Skin:  Negative for color change and rash.  Neurological:  Negative for dizziness, syncope and headaches.  All other systems reviewed and are negative.    Physical Exam Triage Vital Signs ED Triage Vitals  Encounter Vitals Group     BP      Girls Systolic BP Percentile      Girls Diastolic BP Percentile      Boys Systolic BP Percentile      Boys Diastolic BP Percentile      Pulse      Resp      Temp      Temp src      SpO2      Weight      Height      Head Circumference      Peak Flow      Pain Score      Pain Loc      Pain Education      Exclude from Growth Chart    No data found.  Updated Vital Signs BP (!) 149/104 (BP Location: Left Arm)   Pulse 76   Temp 98.1 F (36.7 C) (Oral)   Resp 18   SpO2 98%   Visual Acuity Right Eye Distance:   Left Eye Distance:   Bilateral Distance:    Right Eye Near:   Left Eye Near:    Bilateral Near:     Physical Exam Vitals and nursing note reviewed.  Constitutional:      General: She is not in acute distress.    Appearance: She is well-developed. She is not ill-appearing or toxic-appearing.     Comments: Pleasant female appearing stated age found sitting in chair in no acute distress.  HENT:     Head: Normocephalic and atraumatic.     Mouth/Throat:     Lips: Pink.  Eyes:     Conjunctiva/sclera: Conjunctivae normal.  Cardiovascular:     Rate and Rhythm: Normal rate and regular rhythm.     Heart sounds: Normal heart sounds. No murmur heard. Pulmonary:     Effort: Pulmonary effort is normal. No respiratory distress.     Breath sounds: Normal breath sounds.  Abdominal:     General: Bowel sounds are normal.     Palpations: Abdomen is soft.     Tenderness: There is no abdominal tenderness.   Musculoskeletal:  General: No swelling.  Skin:    General: Skin is warm and dry.     Capillary Refill: Capillary refill takes less than 2 seconds.  Neurological:     Mental Status: She is alert.  Psychiatric:        Mood and Affect: Mood normal.      UC Treatments / Results  Labs (all labs ordered are listed, but only abnormal results are displayed) Labs Reviewed  CBC  BASIC METABOLIC PANEL WITH GFR  POCT URINE PREGNANCY    EKG   Radiology No results found.  Procedures Procedures (including critical care time)  Medications Ordered in UC Medications - No data to display  Initial Impression / Assessment and Plan / UC Course  I have reviewed the triage vital signs and the nursing notes.  Pertinent labs & imaging results that were available during my care of the patient were reviewed by me and considered in my medical decision making (see chart for details).     Vitals and triage reviewed.  She is hypertensive today with BP 166/106.  Repeat blood pressure 149/104.  Chart review reveals history of hypertension.  She reports compliance with amlodipine .  She denies headache, blurry vision.  Her urine pregnancy test is negative.  Suspect vaginal bleeding related to recent Depo-Provera  injection.  She was offered vaginal swab, however she denies concern for STI.  CBC, BMP obtained.  She will be notified via telephone for abnormalities.  Plan of care, follow-up care, return precautions given, no questions at this time. Final Clinical Impressions(s) / UC Diagnoses   Final diagnoses:  Vaginal bleeding  Hypertension, unspecified type     Discharge Instructions      Your urine pregnancy test was negative. We obtained lab work.  If this is abnormal, you will receive a phone call.  Take Tylenol /acetaminophen  for menstrual cramping.  Apply heat packs to lower abdomen 15 to 20 minutes at a time several times daily to help relieve menstrual cramps.  Your blood  pressure is elevated today.  Please continue to take amlodipine  as directed.  Please follow-up with your primary care provider regarding continued hypertensive readings.  If you develop any new or worsening symptoms or if your symptoms do not start to improve, please return here or follow-up with your primary care provider.  If your symptoms are severe, please go to the emergency room.      ED Prescriptions   None    PDMP not reviewed this encounter.   Lennice Jon BROCKS, FNP 11/13/24 [phone]

## 2024-11-13 NOTE — ED Triage Notes (Signed)
 Pt coming in today for vaginal bleeding and passing clots that started yesterday. Taking Depo shot and has irregular cycles. States has been pregnant on depo.

## 2024-11-13 NOTE — Discharge Instructions (Addendum)
 Your urine pregnancy test was negative. We obtained lab work.  If this is abnormal, you will receive a phone call.  Take Tylenol /acetaminophen  for menstrual cramping.  Apply heat packs to lower abdomen 15 to 20 minutes at a time several times daily to help relieve menstrual cramps.  Your blood pressure is elevated today.  Please continue to take amlodipine  as directed.  Please follow-up with your primary care provider regarding continued hypertensive readings.  If you develop any new or worsening symptoms or if your symptoms do not start to improve, please return here or follow-up with your primary care provider.  If your symptoms are severe, please go to the emergency room.

## 2024-11-14 ENCOUNTER — Ambulatory Visit (HOSPITAL_COMMUNITY): Payer: Self-pay

## 2024-11-14 MED ORDER — POTASSIUM CHLORIDE CRYS ER 20 MEQ PO TBCR: 20.0000 meq | EXTENDED_RELEASE_TABLET | Freq: Two times a day (BID) | ORAL | 0 refills | Status: AC

## 2024-11-14 NOTE — Telephone Encounter (Signed)
 RX sent for potassium given hypokalemia on CMP

## 2025-01-29 ENCOUNTER — Ambulatory Visit
